# Patient Record
Sex: Male | Born: 1955
Health system: Southern US, Community
[De-identification: ages and names within clinical notes are randomized; demographics above are authoritative.]

## PROBLEM LIST (undated history)

## (undated) DIAGNOSIS — M1990 Unspecified osteoarthritis, unspecified site: Secondary | ICD-10-CM

## (undated) DIAGNOSIS — K219 Gastro-esophageal reflux disease without esophagitis: Secondary | ICD-10-CM

## (undated) DIAGNOSIS — J449 Chronic obstructive pulmonary disease, unspecified: Secondary | ICD-10-CM

## (undated) DIAGNOSIS — I10 Essential (primary) hypertension: Secondary | ICD-10-CM

## (undated) HISTORY — PX: KNEE SURGERY: SHX244

---
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
## 2008-10-01 ENCOUNTER — Ambulatory Visit (HOSPITAL_COMMUNITY): Admission: RE | Admit: 2008-10-01 | Discharge: 2008-10-01 | Payer: Self-pay | Admitting: *Deleted

## 2008-10-01 ENCOUNTER — Encounter (INDEPENDENT_AMBULATORY_CARE_PROVIDER_SITE_OTHER): Payer: Self-pay | Admitting: *Deleted

## 2011-04-25 NOTE — Op Note (Signed)
Marc Watson, Marc Watson NO.:  [id number]   MEDICAL RECORD NO.:  [id number]          PATIENT TYPE:  AMB   LOCATION:  ENDO                         FACILITY:  [facility]   PHYSICIAN:  Georgiana Spinner, M.D.    DATE OF BIRTH:  [birth year]   DATE OF PROCEDURE:  10/01/2008  DATE OF DISCHARGE:                               OPERATIVE REPORT   PROCEDURE:  Colonoscopy.   INDICATIONS:  Colon cancer screening.   ANESTHESIA:  Demerol 20, Versed 2.5 mg.   PROCEDURE:  With the patient mildly sedated in the left lateral  decubitus position, a rectal examination was attempted.  Subsequently,  the Pentax videoscopic colonoscope was inserted in the rectum and passed  under direct vision.  With pressure applied, the patient turned to his  back, to his right, subsequently back to his back, and then again to his  left side, we finally were able to reach the cecum as identified by  ileocecal valve and appendiceal orifice, both of which were  photographed.  From this point, the colonoscope was slowly withdrawn,  taking circumferential views of colonic mucosa, stopping first in the  ascending colon, just a fold removed from the ileocecal valve at which  point a polyp was seen, photographed and removed using snare cautery  technique - setting of 20/150 blended current.  We then withdrew the  colonoscope, taking circumferential views of the remaining colonic  mucosa, stopping next in the transverse colon where another polyp was  seen.  It, too, was removed using hot biopsy forceps technique; however,  we stopped then again at 30 cm from anal verge at which point another  polyp was seen.  It, too, was removed using snare cautery technique, and  there was 1 adjacent to which was also removed in the same manner.  The  tissue was retrieved by grasping it with a biopsy forceps and pulling it  into the scope for retrieval.  The endoscope was removed to the rectum  which appeared normal on direct and  showed a polyp on retroflexed view  that was removed using hot biopsy forceps technique.  All at a setting  of 20/150 blended current.  The patient's vital signs and pulse oximeter  remained stable.  The patient tolerated the procedure well without  apparent complication.   FINDINGS:  Polyps as described above in the ascending colon, transverse  colon, at 30 cm from anal verge and in the rectum.  Await biopsy report.  The patient will call me for results and follow-up with me as an  outpatient.           ______________________________  Georgiana Spinner, M.D.     GMO/MEDQ  D:  10/01/2008  T:  10/01/2008  Job:  [id number]

## 2011-04-25 NOTE — Op Note (Signed)
NAMECARLEN, FILS NO.:  [id number]   MEDICAL RECORD NO.:  [id number]          PATIENT TYPE:  AMB   LOCATION:  ENDO                         FACILITY:  [facility]   PHYSICIAN:  Georgiana Spinner, M.D.    DATE OF BIRTH:  1956/08/23   DATE OF PROCEDURE:  DATE OF DISCHARGE:                               OPERATIVE REPORT   PROCEDURE:  Upper endoscopy.   INDICATIONS FOR PROCEDURE:  Gastroesophageal reflux disease.   ANESTHESIA:  Demerol 100 mg, Versed 10 mg, Phenergan 25 mg.   PROCEDURE:  With the patient mildly sedated in the left lateral  decubitus position, the Pentax videoscopic endoscope was inserted in the  mouth and passed under direct vision through the esophagus which  appeared normal, until we reached distal esophagus and there was a one  small area of probable esophagitis photographed and subsequently  biopsied.  We entered into the stomach; fundus, body, antrum, duodenal  bulb, second portion duodenum all appeared normal.  From this point the  endoscope was slowly withdrawn taking circumferential views of duodenal  mucosa until the endoscope was pulled back into stomach, placed in  retroflexion to view the stomach from below.  The endoscope was  straightened and withdrawn taking circumferential views remaining  gastric and esophageal mucosa.  The patient's vital signs, pulse  oximeter remained stable.  The patient tolerated procedure well without  apparent complications.   FINDINGS:  Change of esophagitis biopsied.  Await biopsy report.  The  patient will call me for results and follow up with me as needed as an  outpatient.  Proceed to colonoscopy as planned.           ______________________________  Georgiana Spinner, M.D.     GMO/MEDQ  D:  10/01/2008  T:  10/01/2008  Job:  [id number]

## 2016-05-22 DIAGNOSIS — Z0000 Encounter for general adult medical examination without abnormal findings: Secondary | ICD-10-CM | POA: Diagnosis not present

## 2016-05-22 DIAGNOSIS — R7309 Other abnormal glucose: Secondary | ICD-10-CM | POA: Diagnosis not present

## 2016-05-22 DIAGNOSIS — E7800 Pure hypercholesterolemia, unspecified: Secondary | ICD-10-CM | POA: Diagnosis not present

## 2016-05-26 DIAGNOSIS — E785 Hyperlipidemia, unspecified: Secondary | ICD-10-CM | POA: Diagnosis not present

## 2016-05-26 DIAGNOSIS — Z0001 Encounter for general adult medical examination with abnormal findings: Secondary | ICD-10-CM | POA: Diagnosis not present

## 2016-05-26 DIAGNOSIS — M25552 Pain in left hip: Secondary | ICD-10-CM | POA: Diagnosis not present

## 2016-05-26 DIAGNOSIS — R739 Hyperglycemia, unspecified: Secondary | ICD-10-CM | POA: Diagnosis not present

## 2016-09-22 DIAGNOSIS — R739 Hyperglycemia, unspecified: Secondary | ICD-10-CM | POA: Diagnosis not present

## 2016-09-22 DIAGNOSIS — E785 Hyperlipidemia, unspecified: Secondary | ICD-10-CM | POA: Diagnosis not present

## 2016-09-22 DIAGNOSIS — Z125 Encounter for screening for malignant neoplasm of prostate: Secondary | ICD-10-CM | POA: Diagnosis not present

## 2016-09-25 DIAGNOSIS — R739 Hyperglycemia, unspecified: Secondary | ICD-10-CM | POA: Diagnosis not present

## 2016-09-25 DIAGNOSIS — E7800 Pure hypercholesterolemia, unspecified: Secondary | ICD-10-CM | POA: Diagnosis not present

## 2016-09-25 DIAGNOSIS — Z0000 Encounter for general adult medical examination without abnormal findings: Secondary | ICD-10-CM | POA: Diagnosis not present

## 2016-10-09 DIAGNOSIS — Z1211 Encounter for screening for malignant neoplasm of colon: Secondary | ICD-10-CM | POA: Diagnosis not present

## 2016-11-07 DIAGNOSIS — K635 Polyp of colon: Secondary | ICD-10-CM | POA: Diagnosis not present

## 2016-11-07 DIAGNOSIS — D120 Benign neoplasm of cecum: Secondary | ICD-10-CM | POA: Diagnosis not present

## 2016-11-07 DIAGNOSIS — D123 Benign neoplasm of transverse colon: Secondary | ICD-10-CM | POA: Diagnosis not present

## 2016-11-07 DIAGNOSIS — K6389 Other specified diseases of intestine: Secondary | ICD-10-CM | POA: Diagnosis not present

## 2016-11-07 DIAGNOSIS — D122 Benign neoplasm of ascending colon: Secondary | ICD-10-CM | POA: Diagnosis not present

## 2016-11-07 DIAGNOSIS — Z1211 Encounter for screening for malignant neoplasm of colon: Secondary | ICD-10-CM | POA: Diagnosis not present

## 2016-12-11 HISTORY — PX: JOINT REPLACEMENT: SHX530

## 2016-12-31 DIAGNOSIS — J019 Acute sinusitis, unspecified: Secondary | ICD-10-CM | POA: Diagnosis not present

## 2016-12-31 DIAGNOSIS — J209 Acute bronchitis, unspecified: Secondary | ICD-10-CM | POA: Diagnosis not present

## 2016-12-31 DIAGNOSIS — J111 Influenza due to unidentified influenza virus with other respiratory manifestations: Secondary | ICD-10-CM | POA: Diagnosis not present

## 2017-01-18 DIAGNOSIS — M9901 Segmental and somatic dysfunction of cervical region: Secondary | ICD-10-CM | POA: Diagnosis not present

## 2017-01-19 DIAGNOSIS — M50122 Cervical disc disorder at C5-C6 level with radiculopathy: Secondary | ICD-10-CM | POA: Diagnosis not present

## 2017-01-19 DIAGNOSIS — M9901 Segmental and somatic dysfunction of cervical region: Secondary | ICD-10-CM | POA: Diagnosis not present

## 2017-01-19 DIAGNOSIS — M9902 Segmental and somatic dysfunction of thoracic region: Secondary | ICD-10-CM | POA: Diagnosis not present

## 2017-01-19 DIAGNOSIS — M5384 Other specified dorsopathies, thoracic region: Secondary | ICD-10-CM | POA: Diagnosis not present

## 2017-01-22 DIAGNOSIS — M9901 Segmental and somatic dysfunction of cervical region: Secondary | ICD-10-CM | POA: Diagnosis not present

## 2017-01-24 DIAGNOSIS — M9902 Segmental and somatic dysfunction of thoracic region: Secondary | ICD-10-CM | POA: Diagnosis not present

## 2017-01-24 DIAGNOSIS — M50323 Other cervical disc degeneration at C6-C7 level: Secondary | ICD-10-CM | POA: Diagnosis not present

## 2017-01-24 DIAGNOSIS — M9901 Segmental and somatic dysfunction of cervical region: Secondary | ICD-10-CM | POA: Diagnosis not present

## 2017-01-24 DIAGNOSIS — M5383 Other specified dorsopathies, cervicothoracic region: Secondary | ICD-10-CM | POA: Diagnosis not present

## 2017-01-25 DIAGNOSIS — M5032 Other cervical disc degeneration, mid-cervical region, unspecified level: Secondary | ICD-10-CM | POA: Diagnosis not present

## 2017-01-26 DIAGNOSIS — M9901 Segmental and somatic dysfunction of cervical region: Secondary | ICD-10-CM | POA: Diagnosis not present

## 2017-01-26 DIAGNOSIS — M5383 Other specified dorsopathies, cervicothoracic region: Secondary | ICD-10-CM | POA: Diagnosis not present

## 2017-01-26 DIAGNOSIS — M50323 Other cervical disc degeneration at C6-C7 level: Secondary | ICD-10-CM | POA: Diagnosis not present

## 2017-01-26 DIAGNOSIS — M9902 Segmental and somatic dysfunction of thoracic region: Secondary | ICD-10-CM | POA: Diagnosis not present

## 2017-01-29 DIAGNOSIS — M5383 Other specified dorsopathies, cervicothoracic region: Secondary | ICD-10-CM | POA: Diagnosis not present

## 2017-01-29 DIAGNOSIS — M9901 Segmental and somatic dysfunction of cervical region: Secondary | ICD-10-CM | POA: Diagnosis not present

## 2017-01-29 DIAGNOSIS — M9902 Segmental and somatic dysfunction of thoracic region: Secondary | ICD-10-CM | POA: Diagnosis not present

## 2017-01-29 DIAGNOSIS — M50323 Other cervical disc degeneration at C6-C7 level: Secondary | ICD-10-CM | POA: Diagnosis not present

## 2017-01-31 DIAGNOSIS — M5383 Other specified dorsopathies, cervicothoracic region: Secondary | ICD-10-CM | POA: Diagnosis not present

## 2017-01-31 DIAGNOSIS — M50323 Other cervical disc degeneration at C6-C7 level: Secondary | ICD-10-CM | POA: Diagnosis not present

## 2017-01-31 DIAGNOSIS — M9901 Segmental and somatic dysfunction of cervical region: Secondary | ICD-10-CM | POA: Diagnosis not present

## 2017-01-31 DIAGNOSIS — M9902 Segmental and somatic dysfunction of thoracic region: Secondary | ICD-10-CM | POA: Diagnosis not present

## 2017-02-02 DIAGNOSIS — M9902 Segmental and somatic dysfunction of thoracic region: Secondary | ICD-10-CM | POA: Diagnosis not present

## 2017-02-02 DIAGNOSIS — M5383 Other specified dorsopathies, cervicothoracic region: Secondary | ICD-10-CM | POA: Diagnosis not present

## 2017-02-02 DIAGNOSIS — M50323 Other cervical disc degeneration at C6-C7 level: Secondary | ICD-10-CM | POA: Diagnosis not present

## 2017-02-02 DIAGNOSIS — M9901 Segmental and somatic dysfunction of cervical region: Secondary | ICD-10-CM | POA: Diagnosis not present

## 2017-02-05 DIAGNOSIS — M50323 Other cervical disc degeneration at C6-C7 level: Secondary | ICD-10-CM | POA: Diagnosis not present

## 2017-02-05 DIAGNOSIS — M9901 Segmental and somatic dysfunction of cervical region: Secondary | ICD-10-CM | POA: Diagnosis not present

## 2017-02-05 DIAGNOSIS — M9905 Segmental and somatic dysfunction of pelvic region: Secondary | ICD-10-CM | POA: Diagnosis not present

## 2017-02-05 DIAGNOSIS — M1612 Unilateral primary osteoarthritis, left hip: Secondary | ICD-10-CM | POA: Diagnosis not present

## 2017-02-07 DIAGNOSIS — M9901 Segmental and somatic dysfunction of cervical region: Secondary | ICD-10-CM | POA: Diagnosis not present

## 2017-02-07 DIAGNOSIS — M9905 Segmental and somatic dysfunction of pelvic region: Secondary | ICD-10-CM | POA: Diagnosis not present

## 2017-02-07 DIAGNOSIS — M50323 Other cervical disc degeneration at C6-C7 level: Secondary | ICD-10-CM | POA: Diagnosis not present

## 2017-02-07 DIAGNOSIS — M1612 Unilateral primary osteoarthritis, left hip: Secondary | ICD-10-CM | POA: Diagnosis not present

## 2017-02-09 DIAGNOSIS — M9905 Segmental and somatic dysfunction of pelvic region: Secondary | ICD-10-CM | POA: Diagnosis not present

## 2017-02-09 DIAGNOSIS — M9901 Segmental and somatic dysfunction of cervical region: Secondary | ICD-10-CM | POA: Diagnosis not present

## 2017-02-09 DIAGNOSIS — M1612 Unilateral primary osteoarthritis, left hip: Secondary | ICD-10-CM | POA: Diagnosis not present

## 2017-02-09 DIAGNOSIS — M50323 Other cervical disc degeneration at C6-C7 level: Secondary | ICD-10-CM | POA: Diagnosis not present

## 2017-02-13 DIAGNOSIS — M9901 Segmental and somatic dysfunction of cervical region: Secondary | ICD-10-CM | POA: Diagnosis not present

## 2017-02-13 DIAGNOSIS — M50323 Other cervical disc degeneration at C6-C7 level: Secondary | ICD-10-CM | POA: Diagnosis not present

## 2017-02-13 DIAGNOSIS — M1612 Unilateral primary osteoarthritis, left hip: Secondary | ICD-10-CM | POA: Diagnosis not present

## 2017-02-13 DIAGNOSIS — M9905 Segmental and somatic dysfunction of pelvic region: Secondary | ICD-10-CM | POA: Diagnosis not present

## 2017-02-16 DIAGNOSIS — M50323 Other cervical disc degeneration at C6-C7 level: Secondary | ICD-10-CM | POA: Diagnosis not present

## 2017-02-16 DIAGNOSIS — M1612 Unilateral primary osteoarthritis, left hip: Secondary | ICD-10-CM | POA: Diagnosis not present

## 2017-02-16 DIAGNOSIS — M9905 Segmental and somatic dysfunction of pelvic region: Secondary | ICD-10-CM | POA: Diagnosis not present

## 2017-02-16 DIAGNOSIS — M9901 Segmental and somatic dysfunction of cervical region: Secondary | ICD-10-CM | POA: Diagnosis not present

## 2017-02-20 DIAGNOSIS — M9905 Segmental and somatic dysfunction of pelvic region: Secondary | ICD-10-CM | POA: Diagnosis not present

## 2017-02-20 DIAGNOSIS — M50323 Other cervical disc degeneration at C6-C7 level: Secondary | ICD-10-CM | POA: Diagnosis not present

## 2017-02-20 DIAGNOSIS — M9901 Segmental and somatic dysfunction of cervical region: Secondary | ICD-10-CM | POA: Diagnosis not present

## 2017-02-20 DIAGNOSIS — M1612 Unilateral primary osteoarthritis, left hip: Secondary | ICD-10-CM | POA: Diagnosis not present

## 2017-02-26 DIAGNOSIS — M50323 Other cervical disc degeneration at C6-C7 level: Secondary | ICD-10-CM | POA: Diagnosis not present

## 2017-02-26 DIAGNOSIS — M9901 Segmental and somatic dysfunction of cervical region: Secondary | ICD-10-CM | POA: Diagnosis not present

## 2017-02-26 DIAGNOSIS — M9905 Segmental and somatic dysfunction of pelvic region: Secondary | ICD-10-CM | POA: Diagnosis not present

## 2017-02-26 DIAGNOSIS — M1612 Unilateral primary osteoarthritis, left hip: Secondary | ICD-10-CM | POA: Diagnosis not present

## 2017-02-28 DIAGNOSIS — M25552 Pain in left hip: Secondary | ICD-10-CM | POA: Diagnosis not present

## 2017-03-05 ENCOUNTER — Other Ambulatory Visit: Payer: Self-pay | Admitting: Orthopaedic Surgery

## 2017-03-16 NOTE — Pre-Procedure Instructions (Addendum)
Marc Watson  03/16/2017      [place], [address] [place] [address] Phone: [phone] Fax: [phone]    Your procedure is scheduled on April 17  Report to [place] at 530 A.M.  Call this number if you have problems the morning of surgery:  [phone]   Remember:  Do not eat food or drink liquids after midnight.  Take these medicines the morning of surgery with A SIP OF WATER  Tylenol if needed  Stop taking aspirin, BC's, Goody's, Herbal medications, Fish Oil, Ibuprofen, Advil, Motrin, Aleve, Naproxen, Vitamins    Do not wear jewelry, make-up or nail polish.  Do not wear lotions, powders, or perfumes, or deoderant.  Do not shave 48 hours prior to surgery.  Men may shave face and neck.  Do not bring valuables to the hospital.  [facility] is not responsible for any belongings or valuables.  Contacts, dentures or bridgework may not be worn into surgery.  Leave your suitcase in the car.  After surgery it may be brought to your room.  For patients admitted to the hospital, discharge time will be determined by your treatment team.  Patients discharged the day of surgery will not be allowed to drive home.   Special instructions:  [place] - Preparing for Surgery  Before surgery, you can play an important role.  Because skin is not sterile, your skin needs to be as free of germs as possible.  You can reduce the number of germs on you skin by washing with CHG (chlorahexidine gluconate) soap before surgery.  CHG is an antiseptic cleaner which kills germs and bonds with the skin to continue killing germs even after washing.  Please DO NOT use if you have an allergy to CHG or antibacterial soaps.  If your skin becomes reddened/irritated stop using the CHG and inform your nurse when you arrive at Short Stay.  Do not shave (including legs and underarms) for at least 48 hours prior to the first CHG  shower.  You may shave your face.  Please follow these instructions carefully:   1.  Shower with CHG Soap the night before surgery and the                                morning of Surgery.  2.  If you choose to wash your hair, wash your hair first as usual with your       normal shampoo.  3.  After you shampoo, rinse your hair and body thoroughly to remove the                      Shampoo.  4.  Use CHG as you would any other liquid soap.  You can apply chg directly       to the skin and wash gently with scrungie or a clean washcloth.  5.  Apply the CHG Soap to your body ONLY FROM THE NECK DOWN.        Do not use on open wounds or open sores.  Avoid contact with your eyes,       ears, mouth and genitals (private parts).  Wash genitals (private parts)       with your normal soap.  6.  Wash thoroughly, paying special attention to the area where your surgery  will be performed.  7.  Thoroughly rinse your body with warm water from the neck down.  8.  DO NOT shower/wash with your normal soap after using and rinsing off       the CHG Soap.  9.  Pat yourself dry with a clean towel.            10.  Wear clean pajamas.            11.  Place clean sheets on your bed the night of your first shower and do not        sleep with pets.  Day of Surgery  Do not apply any lotions/deoderants the morning of surgery.  Please wear clean clothes to the hospital/surgery center.     Please read over the  fact sheets that you were given.

## 2017-03-19 ENCOUNTER — Encounter (HOSPITAL_COMMUNITY): Payer: Self-pay

## 2017-03-19 ENCOUNTER — Encounter (HOSPITAL_COMMUNITY)
Admission: RE | Admit: 2017-03-19 | Discharge: 2017-03-19 | Disposition: A | Discharge status: Home / Self Care | Payer: BLUE CROSS/BLUE SHIELD | Source: Ambulatory Visit | Attending: Orthopaedic Surgery | Admitting: Orthopaedic Surgery

## 2017-03-19 ENCOUNTER — Ambulatory Visit (HOSPITAL_COMMUNITY)
Admission: RE | Admit: 2017-03-19 | Discharge: 2017-03-19 | Disposition: A | Discharge status: Home / Self Care | Payer: BLUE CROSS/BLUE SHIELD | Source: Ambulatory Visit | Attending: Orthopaedic Surgery | Admitting: Orthopaedic Surgery

## 2017-03-19 DIAGNOSIS — Z01812 Encounter for preprocedural laboratory examination: Secondary | ICD-10-CM | POA: Diagnosis not present

## 2017-03-19 DIAGNOSIS — Z01818 Encounter for other preprocedural examination: Secondary | ICD-10-CM

## 2017-03-19 DIAGNOSIS — R001 Bradycardia, unspecified: Secondary | ICD-10-CM | POA: Diagnosis not present

## 2017-03-19 DIAGNOSIS — Z01810 Encounter for preprocedural cardiovascular examination: Secondary | ICD-10-CM | POA: Insufficient documentation

## 2017-03-19 DIAGNOSIS — M1612 Unilateral primary osteoarthritis, left hip: Secondary | ICD-10-CM | POA: Insufficient documentation

## 2017-03-19 HISTORY — DX: Gastro-esophageal reflux disease without esophagitis: K21.9

## 2017-03-19 HISTORY — DX: Unspecified osteoarthritis, unspecified site: M19.90

## 2017-03-19 LAB — CBC WITH DIFFERENTIAL/PLATELET
Basophils Absolute: 0 10*3/uL (ref 0.0–0.1)
Basophils Relative: 0 %
Eosinophils Absolute: 0.4 10*3/uL (ref 0.0–0.7)
Eosinophils Relative: 4 %
HCT: 45.8 % (ref 39.0–52.0)
Hemoglobin: 15.2 g/dL (ref 13.0–17.0)
Lymphocytes Relative: 28 %
Lymphs Abs: 2.6 10*3/uL (ref 0.7–4.0)
MCH: 30.3 pg (ref 26.0–34.0)
MCHC: 33.2 g/dL (ref 30.0–36.0)
MCV: 91.4 fL (ref 78.0–100.0)
Monocytes Absolute: 0.9 10*3/uL (ref 0.1–1.0)
Monocytes Relative: 10 %
Neutro Abs: 5.2 10*3/uL (ref 1.7–7.7)
Neutrophils Relative %: 58 %
Platelets: 285 10*3/uL (ref 150–400)
RBC: 5.01 MIL/uL (ref 4.22–5.81)
RDW: 14.3 % (ref 11.5–15.5)
WBC: 9.1 10*3/uL (ref 4.0–10.5)

## 2017-03-19 LAB — BASIC METABOLIC PANEL
Anion gap: 7 (ref 5–15)
BUN: 16 mg/dL (ref 6–20)
CO2: 24 mmol/L (ref 22–32)
Calcium: 8.9 mg/dL (ref 8.9–10.3)
Chloride: 109 mmol/L (ref 101–111)
Creatinine, Ser: 0.84 mg/dL (ref 0.61–1.24)
GFR calc Af Amer: >60 mL/min (ref >60)
GFR calc non Af Amer: >60 mL/min (ref >60)
Glucose, Bld: 100 mg/dL — ABNORMAL HIGH (ref 65–99)
Potassium: 3.9 mmol/L (ref 3.5–5.1)
Sodium: 140 mmol/L (ref 135–145)

## 2017-03-19 LAB — URINALYSIS, ROUTINE W REFLEX MICROSCOPIC
Bacteria, UA: NONE SEEN
Bilirubin Urine: NEGATIVE
Glucose, UA: NEGATIVE mg/dL
Ketones, ur: NEGATIVE mg/dL
Leukocytes, UA: NEGATIVE
Nitrite: NEGATIVE
Protein, ur: NEGATIVE mg/dL
Specific Gravity, Urine: 1.026 (ref 1.005–1.030)
pH: 5 (ref 5.0–8.0)

## 2017-03-19 LAB — SURGICAL PCR SCREEN
MRSA, PCR: NEGATIVE
Staphylococcus aureus: POSITIVE — AB

## 2017-03-19 LAB — APTT: aPTT: 31 seconds (ref 24–36)

## 2017-03-19 LAB — ABO/RH: ABO/RH(D): O POS

## 2017-03-19 LAB — PROTIME-INR
INR: 0.92
Prothrombin Time: 12.4 seconds (ref 11.4–15.2)

## 2017-03-19 LAB — TYPE AND SCREEN
ABO/RH(D): O POS
Antibody Screen: NEGATIVE

## 2017-03-19 NOTE — Pre-Procedure Instructions (Signed)
Marc Watson  03/19/2017      [facility] - [place], [place] [address] Phone: [phone] Fax: [phone]    Your procedure is scheduled on April 17  Report to [place] at 530 A.M.  Call this number if you have problems the morning of surgery:  [phone]   Remember:  Do not eat food or drink liquids after midnight.  Take these medicines the morning of surgery with A SIP OF WATER  Tylenol if needed  Stop taking aspirin, BC's, Goody's, Herbal medications, Fish Oil, Ibuprofen, Advil, Motrin, Aleve, Naproxen, Vitamins    Do not wear jewelry, make-up or nail polish.  Do not wear lotions, powders, or perfumes, or deoderant.  Do not shave 48 hours prior to surgery.  Men may shave face and neck.  Do not bring valuables to the hospital.  [facility] is not responsible for any belongings or valuables.  Contacts, dentures or bridgework may not be worn into surgery.  Leave your suitcase in the car.  After surgery it may be brought to your room.  For patients admitted to the hospital, discharge time will be determined by your treatment team.  Patients discharged the day of surgery will not be allowed to drive home.   Special instructions:  [place] - Preparing for Surgery  Before surgery, you can play an important role.  Because skin is not sterile, your skin needs to be as free of germs as possible.  You can reduce the number of germs on you skin by washing with CHG (chlorahexidine gluconate) soap before surgery.  CHG is an antiseptic cleaner which kills germs and bonds with the skin to continue killing germs even after washing.  Please DO NOT use if you have an allergy to CHG or antibacterial soaps.  If your skin becomes reddened/irritated stop using the CHG and inform your nurse when you arrive at Short Stay.  Do not shave (including legs and underarms) for at least 48 hours prior to the first CHG  shower.  You may shave your face.  Please follow these instructions carefully:   1.  Shower with CHG Soap the night before surgery and the                                morning of Surgery.  2.  If you choose to wash your hair, wash your hair first as usual with your       normal shampoo.  3.  After you shampoo, rinse your hair and body thoroughly to remove the                      Shampoo.  4.  Use CHG as you would any other liquid soap.  You can apply chg directly       to the skin and wash gently with scrungie or a clean washcloth.  5.  Apply the CHG Soap to your body ONLY FROM THE NECK DOWN.        Do not use on open wounds or open sores.  Avoid contact with your eyes,       ears, mouth and genitals (private parts).  Wash genitals (private parts)       with your normal soap.  6.  Wash thoroughly, paying special attention to the area where your surgery  will be performed.  7.  Thoroughly rinse your body with warm water from the neck down.  8.  DO NOT shower/wash with your normal soap after using and rinsing off       the CHG Soap.  9.  Pat yourself dry with a clean towel.            10.  Wear clean pajamas.            11.  Place clean sheets on your bed the night of your first shower and do not        sleep with pets.  Day of Surgery  Do not apply any lotions/deoderants the morning of surgery.  Please wear clean clothes to the hospital/surgery center.     Please read over the  fact sheets that you were given.

## 2017-03-21 NOTE — H&P (Signed)
TOTAL HIP ADMISSION H&P  Patient is admitted for left total hip arthroplasty.  Subjective:  Chief Complaint: left hip pain  HPI: Marc Watson, 61 y.o. male, has a history of pain and functional disability in the left hip(s) due to arthritis and patient has failed non-surgical conservative treatments for greater than 12 weeks to include NSAID's and/or analgesics, flexibility and strengthening excercises, use of assistive devices, weight reduction as appropriate and activity modification.  Onset of symptoms was gradual starting 5 years ago with gradually worsening course since that time.The patient noted no past surgery on the left hip(s).  Patient currently rates pain in the left hip at 10 out of 10 with activity. Patient has night pain, worsening of pain with activity and weight bearing, trendelenberg gait, pain that interfers with activities of daily living and crepitus. Patient has evidence of subchondral cysts, subchondral sclerosis, periarticular osteophytes and joint space narrowing by imaging studies. This condition presents safety issues increasing the risk of falls.  There is no current active infection.  There are no active problems to display for this patient.  Past Medical History:  Diagnosis Date  . Arthritis   . GERD (gastroesophageal reflux disease)    occ    Past Surgical History:  Procedure Laterality Date  . KNEE SURGERY Right    cartilage  . LEG SURGERY Left 1969   "stunted growth left leg"    No prescriptions prior to admission.   No Known Allergies  Social History  Substance Use Topics  . Smoking status: Current Every Day Smoker    Packs/day: 1.00    Years: 30.00  . Smokeless tobacco: Never Used  . Alcohol use Yes     Comment: liquor ,margarita few wk    No family history on file.   Review of Systems  Musculoskeletal: Positive for joint pain.       Left  All other systems reviewed and are negative.   Objective:  Physical Exam  Constitutional: He  is oriented to person, place, and time. He appears well-developed and well-nourished.  HENT:  Head: Normocephalic and atraumatic.  Eyes: Pupils are equal, round, and reactive to light.  Neck: Normal range of motion.  Cardiovascular: Normal rate.   Respiratory: Effort normal.  GI: Soft.  Musculoskeletal:  Left hip motion is quite limited and extremely painful.  He walks with a significant limp.  Leg lengths look roughly equal.  Sensation and motor function are intact distally.  He has palpable pulses in his feet.  There is no palpable lymphadenopathy behind his knee.    Neurological: He is alert and oriented to person, place, and time.  Skin: Skin is warm and dry.  Psychiatric: He has a normal mood and affect. His behavior is normal. Judgment and thought content normal.    Vital signs in last 24 hours:    Labs:   Estimated body mass index is 30.93 kg/m as calculated from the following:   Height as of 03/19/17: 6\' 1"  (1.854 m).   Weight as of 03/19/17: 106.3 kg (234 lb 6.4 oz).   Imaging Review Plain radiographs demonstrate severe degenerative joint disease of the left hip(s). The bone quality appears to be good for age and reported activity level.  Assessment/Plan:  End stage primary arthritis, left hip(s)  The patient history, physical examination, clinical judgement of the provider and imaging studies are consistent with end stage degenerative joint disease of the left hip(s) and total hip arthroplasty is deemed medically necessary. The treatment options  including medical management, injection therapy, arthroscopy and arthroplasty were discussed at length. The risks and benefits of total hip arthroplasty were presented and reviewed. The risks due to aseptic loosening, infection, stiffness, dislocation/subluxation,  thromboembolic complications and other imponderables were discussed.  The patient acknowledged the explanation, agreed to proceed with the plan and consent was signed.  Patient is being admitted for inpatient treatment for surgery, pain control, PT, OT, prophylactic antibiotics, VTE prophylaxis, progressive ambulation and ADL's and discharge planning.The patient is planning to be discharged home with home health services

## 2017-03-21 NOTE — Progress Notes (Signed)
Anesthesia Chart Review: Patient is a 61 year old male scheduled for left THA on 03/27/17 by Dr. Rhona Watson. Anesthesia is posted for spinal.  History includes smoking, GERD, arthritis, left leg surgery (for "stunted growth") '69. BMI is consistent with mild obesity. No reported history of HTN, but BP was elevated at 166/97 at PAT, but was not rechecked.  No PCP per patient is listed. (PAT RN requested records from Dr. Jani Watson, but nothing received as of yet.)  Meds include fish oil.  BP (!) 166/97   Pulse 68   Temp 36.9 C   Resp 20   Ht 6\' 1"  (1.854 m)   Wt 234 lb 6.4 oz (106.3 kg)   SpO2 98%   BMI 30.93 kg/m   EKG 03/19/17: SB at 58 bpm.   He reported prior stress test many years ago, but wasn't sure where it was done.   CXR 03/19/17: IMPRESSION: No active cardiopulmonary disease.  Preoperative labs noted. UA showed negative leukocytes, nitrites.  Patient will get vitals on arrival to re-evaluate BP. If result acceptable and otherwise no acute changes then I anticipate that he can proceed as planned.  [facility] Short Stay Center/Anesthesiology Phone [phone] 03/21/2017 1:50 PM

## 2017-03-26 MED ORDER — TRANEXAMIC ACID 1000 MG/10ML IV SOLN
2000.0000 mg | INTRAVENOUS | Status: AC
  Administered 2017-03-27: 2000 mg via TOPICAL
  Filled 2017-03-26: qty 20

## 2017-03-27 ENCOUNTER — Inpatient Hospital Stay (HOSPITAL_COMMUNITY): Payer: BLUE CROSS/BLUE SHIELD

## 2017-03-27 ENCOUNTER — Inpatient Hospital Stay (HOSPITAL_COMMUNITY)
Admission: RE | Admit: 2017-03-27 | Discharge: 2017-03-28 | DRG: 470 | Disposition: A | Discharge status: Home Health | Payer: BLUE CROSS/BLUE SHIELD | Source: Ambulatory Visit | Attending: Orthopaedic Surgery | Admitting: Orthopaedic Surgery

## 2017-03-27 ENCOUNTER — Inpatient Hospital Stay (HOSPITAL_COMMUNITY): Payer: BLUE CROSS/BLUE SHIELD | Admitting: Anesthesiology

## 2017-03-27 ENCOUNTER — Encounter (HOSPITAL_COMMUNITY): Payer: Self-pay | Admitting: *Deleted

## 2017-03-27 ENCOUNTER — Encounter (HOSPITAL_COMMUNITY)
Admission: RE | Disposition: A | Discharge status: Home Health | Payer: Self-pay | Source: Ambulatory Visit | Attending: Orthopaedic Surgery

## 2017-03-27 ENCOUNTER — Inpatient Hospital Stay (HOSPITAL_COMMUNITY): Payer: BLUE CROSS/BLUE SHIELD | Admitting: Vascular Surgery

## 2017-03-27 DIAGNOSIS — M1612 Unilateral primary osteoarthritis, left hip: Principal | ICD-10-CM | POA: Diagnosis present

## 2017-03-27 DIAGNOSIS — Z96642 Presence of left artificial hip joint: Secondary | ICD-10-CM | POA: Diagnosis not present

## 2017-03-27 DIAGNOSIS — Z419 Encounter for procedure for purposes other than remedying health state, unspecified: Secondary | ICD-10-CM

## 2017-03-27 DIAGNOSIS — F17210 Nicotine dependence, cigarettes, uncomplicated: Secondary | ICD-10-CM | POA: Diagnosis not present

## 2017-03-27 DIAGNOSIS — Z471 Aftercare following joint replacement surgery: Secondary | ICD-10-CM | POA: Diagnosis not present

## 2017-03-27 HISTORY — PX: TOTAL HIP ARTHROPLASTY: SHX124

## 2017-03-27 SURGERY — ARTHROPLASTY, HIP, TOTAL, ANTERIOR APPROACH
Anesthesia: Spinal | Site: Hip | Laterality: Left

## 2017-03-27 MED ORDER — METOCLOPRAMIDE HCL 5 MG PO TABS: 5.0000 mg | ORAL_TABLET | Freq: Three times a day (TID) | ORAL | Status: DC | PRN

## 2017-03-27 MED ORDER — BUPIVACAINE HCL (PF) 0.5 % IJ SOLN
INTRAMUSCULAR | Status: AC
  Filled 2017-03-27: qty 30

## 2017-03-27 MED ORDER — SUCCINYLCHOLINE CHLORIDE 200 MG/10ML IV SOSY
PREFILLED_SYRINGE | INTRAVENOUS | Status: AC
  Filled 2017-03-27: qty 10

## 2017-03-27 MED ORDER — CHLORHEXIDINE GLUCONATE 4 % EX LIQD: 60.0000 mL | Freq: Once | CUTANEOUS | Status: DC

## 2017-03-27 MED ORDER — HYDROMORPHONE HCL 1 MG/ML IJ SOLN
0.5000 mg | INTRAMUSCULAR | Status: DC | PRN
  Administered 2017-03-27 – 2017-03-28 (×4): 1 mg via INTRAVENOUS
  Filled 2017-03-27 (×4): qty 1

## 2017-03-27 MED ORDER — METHOCARBAMOL 1000 MG/10ML IJ SOLN
500.0000 mg | Freq: Four times a day (QID) | INTRAVENOUS | Status: DC | PRN
  Filled 2017-03-27: qty 5

## 2017-03-27 MED ORDER — PROPOFOL 10 MG/ML IV BOLUS
INTRAVENOUS | Status: DC | PRN
  Administered 2017-03-27 (×3): 20 mg via INTRAVENOUS

## 2017-03-27 MED ORDER — ASPIRIN EC 325 MG PO TBEC
325.0000 mg | DELAYED_RELEASE_TABLET | Freq: Two times a day (BID) | ORAL | Status: DC
  Administered 2017-03-28: 325 mg via ORAL
  Filled 2017-03-27: qty 1

## 2017-03-27 MED ORDER — CEFAZOLIN SODIUM-DEXTROSE 2-4 GM/100ML-% IV SOLN
2.0000 g | Freq: Four times a day (QID) | INTRAVENOUS | Status: AC
  Administered 2017-03-27 (×2): 2 g via INTRAVENOUS
  Filled 2017-03-27 (×2): qty 100

## 2017-03-27 MED ORDER — BISACODYL 5 MG PO TBEC: 5.0000 mg | DELAYED_RELEASE_TABLET | Freq: Every day | ORAL | Status: DC | PRN

## 2017-03-27 MED ORDER — BUPIVACAINE LIPOSOME 1.3 % IJ SUSP
20.0000 mL | Freq: Once | INTRAMUSCULAR | Status: AC
  Administered 2017-03-27: 20 mL
  Filled 2017-03-27: qty 20

## 2017-03-27 MED ORDER — PHENYLEPHRINE 40 MCG/ML (10ML) SYRINGE FOR IV PUSH (FOR BLOOD PRESSURE SUPPORT)
PREFILLED_SYRINGE | INTRAVENOUS | Status: AC
  Filled 2017-03-27: qty 20

## 2017-03-27 MED ORDER — DOCUSATE SODIUM 100 MG PO CAPS
100.0000 mg | ORAL_CAPSULE | Freq: Two times a day (BID) | ORAL | Status: DC
  Administered 2017-03-27 – 2017-03-28 (×2): 100 mg via ORAL
  Filled 2017-03-27 (×2): qty 1

## 2017-03-27 MED ORDER — ONDANSETRON HCL 4 MG/2ML IJ SOLN
INTRAMUSCULAR | Status: AC
  Filled 2017-03-27: qty 4

## 2017-03-27 MED ORDER — PROPOFOL 500 MG/50ML IV EMUL
INTRAVENOUS | Status: DC | PRN
  Administered 2017-03-27: 75 ug/kg/min via INTRAVENOUS
  Administered 2017-03-27: 09:00:00 via INTRAVENOUS

## 2017-03-27 MED ORDER — PROPOFOL 1000 MG/100ML IV EMUL
INTRAVENOUS | Status: AC
  Filled 2017-03-27: qty 100

## 2017-03-27 MED ORDER — MEPERIDINE HCL 25 MG/ML IJ SOLN
INTRAMUSCULAR | Status: AC
  Filled 2017-03-27: qty 1

## 2017-03-27 MED ORDER — METOCLOPRAMIDE HCL 5 MG/ML IJ SOLN: 10.0000 mg | Freq: Once | INTRAMUSCULAR | Status: DC | PRN

## 2017-03-27 MED ORDER — PHENOL 1.4 % MT LIQD: 1.0000 | OROMUCOSAL | Status: DC | PRN

## 2017-03-27 MED ORDER — FENTANYL CITRATE (PF) 250 MCG/5ML IJ SOLN
INTRAMUSCULAR | Status: AC
  Filled 2017-03-27: qty 5

## 2017-03-27 MED ORDER — LACTATED RINGERS IV SOLN: INTRAVENOUS | Status: DC

## 2017-03-27 MED ORDER — EPHEDRINE 5 MG/ML INJ
INTRAVENOUS | Status: AC
  Filled 2017-03-27: qty 20

## 2017-03-27 MED ORDER — MEPERIDINE HCL 25 MG/ML IJ SOLN: 6.2500 mg | INTRAMUSCULAR | Status: DC | PRN

## 2017-03-27 MED ORDER — EPINEPHRINE PF 1 MG/ML IJ SOLN
INTRAMUSCULAR | Status: AC
  Filled 2017-03-27: qty 1

## 2017-03-27 MED ORDER — TRANEXAMIC ACID 1000 MG/10ML IV SOLN
1000.0000 mg | Freq: Once | INTRAVENOUS | Status: AC
  Administered 2017-03-27: 1000 mg via INTRAVENOUS
  Filled 2017-03-27: qty 10

## 2017-03-27 MED ORDER — ACETAMINOPHEN 325 MG PO TABS
650.0000 mg | ORAL_TABLET | Freq: Four times a day (QID) | ORAL | Status: DC | PRN
  Administered 2017-03-28 (×2): 650 mg via ORAL
  Filled 2017-03-27 (×2): qty 2

## 2017-03-27 MED ORDER — EPINEPHRINE PF 1 MG/ML IJ SOLN
INTRAMUSCULAR | Status: DC | PRN
  Administered 2017-03-27: .15 mL

## 2017-03-27 MED ORDER — 0.9 % SODIUM CHLORIDE (POUR BTL) OPTIME
TOPICAL | Status: DC | PRN
  Administered 2017-03-27: 1000 mL

## 2017-03-27 MED ORDER — MIDAZOLAM HCL 5 MG/5ML IJ SOLN
INTRAMUSCULAR | Status: DC | PRN
  Administered 2017-03-27: 2 mg via INTRAVENOUS

## 2017-03-27 MED ORDER — OXYCODONE HCL 5 MG PO TABS
5.0000 mg | ORAL_TABLET | ORAL | Status: DC | PRN
  Administered 2017-03-27 – 2017-03-28 (×7): 10 mg via ORAL
  Filled 2017-03-27 (×7): qty 2

## 2017-03-27 MED ORDER — BUPIVACAINE HCL (PF) 0.5 % IJ SOLN
INTRAMUSCULAR | Status: DC | PRN
Start: 2017-03-27 — End: 2017-03-27
  Administered 2017-03-27: 3 mL

## 2017-03-27 MED ORDER — MIDAZOLAM HCL 2 MG/2ML IJ SOLN
INTRAMUSCULAR | Status: AC
  Filled 2017-03-27: qty 2

## 2017-03-27 MED ORDER — FENTANYL CITRATE (PF) 100 MCG/2ML IJ SOLN
INTRAMUSCULAR | Status: DC | PRN
  Administered 2017-03-27: 50 ug via INTRAVENOUS

## 2017-03-27 MED ORDER — CEFAZOLIN SODIUM-DEXTROSE 2-4 GM/100ML-% IV SOLN
2.0000 g | INTRAVENOUS | Status: AC
  Administered 2017-03-27: 2 g via INTRAVENOUS

## 2017-03-27 MED ORDER — LIDOCAINE 2% (20 MG/ML) 5 ML SYRINGE
INTRAMUSCULAR | Status: AC
  Filled 2017-03-27: qty 5

## 2017-03-27 MED ORDER — ONDANSETRON HCL 4 MG/2ML IJ SOLN
INTRAMUSCULAR | Status: DC | PRN
  Administered 2017-03-27: 4 mg via INTRAVENOUS

## 2017-03-27 MED ORDER — PHENYLEPHRINE HCL 10 MG/ML IJ SOLN
INTRAVENOUS | Status: DC | PRN
  Administered 2017-03-27: 25 ug/min via INTRAVENOUS

## 2017-03-27 MED ORDER — SUGAMMADEX SODIUM 200 MG/2ML IV SOLN
INTRAVENOUS | Status: AC
  Filled 2017-03-27: qty 4

## 2017-03-27 MED ORDER — METHOCARBAMOL 500 MG PO TABS
500.0000 mg | ORAL_TABLET | Freq: Four times a day (QID) | ORAL | Status: DC | PRN
  Administered 2017-03-27 – 2017-03-28 (×4): 500 mg via ORAL
  Filled 2017-03-27 (×3): qty 1

## 2017-03-27 MED ORDER — LACTATED RINGERS IV SOLN
INTRAVENOUS | Status: DC
  Administered 2017-03-27 (×2): via INTRAVENOUS

## 2017-03-27 MED ORDER — LACTATED RINGERS IV SOLN
INTRAVENOUS | Status: DC
  Administered 2017-03-27 – 2017-03-28 (×2): via INTRAVENOUS

## 2017-03-27 MED ORDER — METOCLOPRAMIDE HCL 5 MG/ML IJ SOLN: 5.0000 mg | Freq: Three times a day (TID) | INTRAMUSCULAR | Status: DC | PRN

## 2017-03-27 MED ORDER — ALBUMIN HUMAN 5 % IV SOLN
INTRAVENOUS | Status: DC | PRN
  Administered 2017-03-27: 09:00:00 via INTRAVENOUS

## 2017-03-27 MED ORDER — METHOCARBAMOL 500 MG PO TABS
ORAL_TABLET | ORAL | Status: AC
  Filled 2017-03-27: qty 1

## 2017-03-27 MED ORDER — MENTHOL 3 MG MT LOZG
1.0000 | LOZENGE | OROMUCOSAL | Status: DC | PRN
Start: 2017-03-27 — End: 2017-03-28

## 2017-03-27 MED ORDER — ONDANSETRON HCL 4 MG/2ML IJ SOLN: 4.0000 mg | Freq: Four times a day (QID) | INTRAMUSCULAR | Status: DC | PRN

## 2017-03-27 MED ORDER — ACETAMINOPHEN 650 MG RE SUPP: 650.0000 mg | Freq: Four times a day (QID) | RECTAL | Status: DC | PRN

## 2017-03-27 MED ORDER — DEXAMETHASONE SODIUM PHOSPHATE 10 MG/ML IJ SOLN
INTRAMUSCULAR | Status: AC
  Filled 2017-03-27: qty 1

## 2017-03-27 MED ORDER — OXYCODONE HCL 5 MG PO TABS
ORAL_TABLET | ORAL | Status: AC
  Filled 2017-03-27: qty 2

## 2017-03-27 MED ORDER — CEFAZOLIN SODIUM-DEXTROSE 2-4 GM/100ML-% IV SOLN
INTRAVENOUS | Status: AC
  Filled 2017-03-27: qty 100

## 2017-03-27 MED ORDER — ALUM & MAG HYDROXIDE-SIMETH 200-200-20 MG/5ML PO SUSP: 30.0000 mL | ORAL | Status: DC | PRN

## 2017-03-27 MED ORDER — FENTANYL CITRATE (PF) 100 MCG/2ML IJ SOLN: 25.0000 ug | INTRAMUSCULAR | Status: DC | PRN

## 2017-03-27 MED ORDER — BUPIVACAINE HCL (PF) 0.5 % IJ SOLN
INTRAMUSCULAR | Status: DC | PRN
  Administered 2017-03-27: 30 mL

## 2017-03-27 MED ORDER — PROPOFOL 10 MG/ML IV BOLUS
INTRAVENOUS | Status: AC
  Filled 2017-03-27: qty 20

## 2017-03-27 MED ORDER — DIPHENHYDRAMINE HCL 12.5 MG/5ML PO ELIX: 12.5000 mg | ORAL_SOLUTION | ORAL | Status: DC | PRN

## 2017-03-27 MED ORDER — ONDANSETRON HCL 4 MG PO TABS: 4.0000 mg | ORAL_TABLET | Freq: Four times a day (QID) | ORAL | Status: DC | PRN

## 2017-03-27 MED ORDER — TRANEXAMIC ACID 1000 MG/10ML IV SOLN
1000.0000 mg | INTRAVENOUS | Status: AC
  Administered 2017-03-27: 1000 mg via INTRAVENOUS
  Filled 2017-03-27: qty 10

## 2017-03-27 SURGICAL SUPPLY — 47 items
BAG DECANTER FOR FLEXI CONT (MISCELLANEOUS) ×1 IMPLANT
BLADE SAW SGTL 18X1.27X75 (BLADE) ×2 IMPLANT
CAPT HIP TOTAL 2 ×1 IMPLANT
CELLS DAT CNTRL 66122 CELL SVR (MISCELLANEOUS) ×1 IMPLANT
CLSR STERI-STRIP ANTIMIC 1/2X4 (GAUZE/BANDAGES/DRESSINGS) ×1 IMPLANT
COVER PERINEAL POST (MISCELLANEOUS) ×2 IMPLANT
COVER SURGICAL LIGHT HANDLE (MISCELLANEOUS) ×2 IMPLANT
DECANTER SPIKE VIAL GLASS SM (MISCELLANEOUS) ×1 IMPLANT
DRAPE C-ARM 42X72 X-RAY (DRAPES) ×2 IMPLANT
DRAPE STERI IOBAN 125X83 (DRAPES) ×2 IMPLANT
DRAPE U-SHAPE 47X51 STRL (DRAPES) ×6 IMPLANT
DRSG AQUACEL AG ADV 3.5X10 (GAUZE/BANDAGES/DRESSINGS) ×2 IMPLANT
DURAPREP 26ML APPLICATOR (WOUND CARE) ×2 IMPLANT
ELECT BLADE 4.0 EZ CLEAN MEGAD (MISCELLANEOUS) ×2
ELECT REM PT RETURN 9FT ADLT (ELECTROSURGICAL) ×2
ELECTRODE BLDE 4.0 EZ CLN MEGD (MISCELLANEOUS) IMPLANT
ELECTRODE REM PT RTRN 9FT ADLT (ELECTROSURGICAL) ×1 IMPLANT
FACESHIELD WRAPAROUND (MASK) ×4 IMPLANT
FACESHIELD WRAPAROUND OR TEAM (MASK) ×2 IMPLANT
FILTER STRAW FLUID ASPIR (MISCELLANEOUS) ×1 IMPLANT
GLOVE BIO SURGEON STRL SZ8 (GLOVE) ×4 IMPLANT
GLOVE BIOGEL PI IND STRL 8 (GLOVE) ×2 IMPLANT
GLOVE BIOGEL PI INDICATOR 8 (GLOVE) ×2
GOWN STRL REUS W/ TWL LRG LVL3 (GOWN DISPOSABLE) ×1 IMPLANT
GOWN STRL REUS W/ TWL XL LVL3 (GOWN DISPOSABLE) ×2 IMPLANT
GOWN STRL REUS W/TWL LRG LVL3 (GOWN DISPOSABLE) ×4
GOWN STRL REUS W/TWL XL LVL3 (GOWN DISPOSABLE) ×6
KIT BASIN OR (CUSTOM PROCEDURE TRAY) ×2 IMPLANT
KIT ROOM TURNOVER OR (KITS) ×2 IMPLANT
MANIFOLD NEPTUNE II (INSTRUMENTS) ×2 IMPLANT
NEEDLE HYPO 22GX1.5 SAFETY (NEEDLE) ×2 IMPLANT
NS IRRIG 1000ML POUR BTL (IV SOLUTION) ×2 IMPLANT
PACK TOTAL JOINT (CUSTOM PROCEDURE TRAY) ×2 IMPLANT
PAD ARMBOARD 7.5X6 YLW CONV (MISCELLANEOUS) ×2 IMPLANT
RETRACTOR WND ALEXIS 18 MED (MISCELLANEOUS) ×1 IMPLANT
RTRCTR WOUND ALEXIS 18CM MED (MISCELLANEOUS) ×2
SUT ETHIBOND NAB CT1 #1 30IN (SUTURE) ×4 IMPLANT
SUT MNCRL AB 3-0 PS2 27 (SUTURE) ×2 IMPLANT
SUT VIC AB 1 CT1 27 (SUTURE) ×2
SUT VIC AB 1 CT1 27XBRD ANBCTR (SUTURE) ×1 IMPLANT
SUT VIC AB 2-0 CT1 27 (SUTURE) ×2
SUT VIC AB 2-0 CT1 TAPERPNT 27 (SUTURE) ×1 IMPLANT
SUT VLOC 180 0 24IN GS25 (SUTURE) ×2 IMPLANT
SYR 50ML LL SCALE MARK (SYRINGE) ×2 IMPLANT
TOWEL OR 17X24 6PK STRL BLUE (TOWEL DISPOSABLE) ×2 IMPLANT
TOWEL OR 17X26 10 PK STRL BLUE (TOWEL DISPOSABLE) ×2 IMPLANT
TRAY CATH 16FR W/PLASTIC CATH (SET/KITS/TRAYS/PACK) ×1 IMPLANT

## 2017-03-27 NOTE — Interval H&P Note (Signed)
History and Physical Interval Note:  03/27/2017 7:16 AM  Marc Watson  has presented today for surgery, with the diagnosis of LEFT HIP DEGENERATIVE JOINT DISEASE  The various methods of treatment have been discussed with the patient and family. After consideration of risks, benefits and other options for treatment, the patient has consented to  Procedure(s): TOTAL HIP ARTHROPLASTY ANTERIOR APPROACH (Left) as a surgical intervention .  The patient's history has been reviewed, patient examined, no change in status, stable for surgery.  I have reviewed the patient's chart and labs.  Questions were answered to the patient's satisfaction.     Rajan Burgard G

## 2017-03-27 NOTE — Op Note (Signed)
PRE-OP DIAGNOSIS:  LEFT HIP DEGENERATIVE JOINT DISEASE POST-OP DIAGNOSIS: same PROCEDURE:  LEFT TOTAL HIP ARTHROPLASTY ANTERIOR APPROACH ANESTHESIA:  Spinal and MAC SURGEON:  Melrose Nakayama MD ASSISTANT:  Loni Dolly PA-C   INDICATIONS FOR PROCEDURE:  The patient is a 62 y.o. male with a long history of a painful hip.  This has persisted despite multiple conservative measures.  The patient has persisted with pain and dysfunction making rest and activity difficult.  A total hip replacement is offered as surgical treatment.  Informed operative consent was obtained after discussion of possible complications including reaction to anesthesia, infection, neurovascular injury, dislocation, DVT, PE, and death.  The importance of the postoperative rehab program to optimize result was stressed with the patient.  SUMMARY OF FINDINGS AND PROCEDURE:  Under general anesthesia through a anterior approach an the Hana table a left THR was performed.  The patient had severe degenerative change and excellent bone quality.  We used DePuy components to replace the hip and these were size KA 18 Corail femur capped with a +8.86m ceramic hip ball.  On the acetabular side we used a size 60 Gription shell with a plus 4 neutral polyethylene liner.  We did use a hole eliminator.  ALoni DollyPA-C assisted throughout and was invaluable to the completion of the case in that he helped position and retract while I performed the procedure.  He also closed simultaneously to help minimize OR time.  I used fluoroscopy throughout the case to check position of components and leg lengths and read all these views myself.  DESCRIPTION OF PROCEDURE:  The patient was taken to the OR suite where general anesthetic was applied.  The patient was then positioned on the Hana table supine.  All bony prominences were appropriately padded.  Prep and drape was then performed in normal sterile fashion.  The patient was given kefzol preoperative antibiotic  and an appropriate time out was performed.  We then took an anterior approach to the left hip.  Dissection was taken through adipose to the tensor fascia lata fascia.  This structure was incised longitudinally and we dissected in the intermuscular interval just medial to this muscle.  Cobra retractors were placed superior and inferior to the femoral neck superficial to the capsule.  A capsular incision was then made and the retractors were placed along the femoral neck.  Xray was brought in to get a good level for the femoral neck cut which was made with an oscillating saw and osteotome.  The femoral head was removed with a corkscrew.  The acetabulum was exposed and some labral tissues were excised. Reaming was taken to the inside wall of the pelvis and sequentially up to 1 mm smaller than the actual component.  A trial of components was done and then the aforementioned acetabular shell was placed in appropriate tilt and anteversion confirmed by fluoroscopy. The liner was placed along with the hole eliminator and attention was turned to the femur.  The leg was brought down and over into adduction and the elevator bar was used to raise the femur up gently in the wound.  The piriformis was released with care taken to preserve the obturator internus attachment and all of the posterior capsule. The femur was reamed and then broached to the appropriate size.  A trial reduction was done and the aforementioned head and neck assembly gave uKoreathe best stability in extension with external rotation.  Leg lengths were felt to be about equal by fluoroscopic exam.  The trial components were removed and the wound irrigated.  We then placed the femoral component in appropriate anteversion.  The head was applied to a dry stem neck and the hip again reduced.  It was again stable in the aforementioned position.  The would was irrigated again followed by re-approximation of anterior capsule with ethibond suture. Tensor fascia was  repaired with V-loc suture  followed by deep closure with #O and #2 undyed vicryl.  Skin was closed with subQ stitch and steristrips followed by a sterile dressing.  EBL and IOF can be obtained from anesthesia records.  DISPOSITION:  The patient was extubated in the OR and taken to PACU in stable condition to be admitted to the Orthopedic Surgery for appropriate post-op care to include perioperative antibiotics and DVT prophylaxis.

## 2017-03-27 NOTE — Anesthesia Postprocedure Evaluation (Signed)
Anesthesia Post Note  Patient: MART COLPITTS  Procedure(s) Performed: Procedure(s) (LRB): TOTAL HIP ARTHROPLASTY ANTERIOR APPROACH (Left)  Patient location during evaluation: PACU Anesthesia Type: Spinal Level of consciousness: awake and alert Pain management: pain level controlled Vital Signs Assessment: post-procedure vital signs reviewed and stable Respiratory status: spontaneous breathing and respiratory function stable Cardiovascular status: blood pressure returned to baseline and stable Postop Assessment: no headache, no backache and spinal receding Anesthetic complications: no       Last Vitals:  Vitals:   03/27/17 1135 03/27/17 1225  BP: (!) 126/95 134/82  Pulse: 71 71  Resp: 16 16  Temp:  36.4 C    Last Pain:  Vitals:   03/27/17 1225  TempSrc: Oral                 Montez Hageman

## 2017-03-27 NOTE — Transfer of Care (Signed)
Immediate Anesthesia Transfer of Care Note  Patient: Marc Watson  Procedure(s) Performed: Procedure(s): TOTAL HIP ARTHROPLASTY ANTERIOR APPROACH (Left)  Patient Location: PACU  Anesthesia Type:MAC and Spinal  Level of Consciousness: awake, alert  and oriented  Airway & Oxygen Therapy: Patient Spontanous Breathing  Post-op Assessment: Report given to RN and Post -op Vital signs reviewed and stable  Post vital signs: Reviewed and stable  Last Vitals:  Vitals:   03/27/17 0604  BP: (!) 156/90  Pulse: 81  Resp: 20  Temp: 36.6 C    Last Pain:  Vitals:   03/27/17 0604  TempSrc: Oral         Complications: No apparent anesthesia complications

## 2017-03-27 NOTE — Anesthesia Procedure Notes (Signed)
Spinal  Patient location during procedure: OR Staffing Anesthesiologist: Echo Propp Performed: anesthesiologist  Preanesthetic Checklist Completed: patient identified, site marked, surgical consent, pre-op evaluation, timeout performed, IV checked, risks and benefits discussed and monitors and equipment checked Spinal Block Patient position: sitting Prep: Betadine Patient monitoring: heart rate, continuous pulse ox and blood pressure Approach: right paramedian Location: L3-4 Injection technique: single-shot Needle Needle type: Sprotte  Needle gauge: 24 G Needle length: 9 cm Additional Notes Expiration date of kit checked and confirmed. Patient tolerated procedure well, without complications.       

## 2017-03-27 NOTE — Anesthesia Preprocedure Evaluation (Signed)
Anesthesia Evaluation  Patient identified by MRN, date of birth, ID band Patient awake    Reviewed: Allergy & Precautions, NPO status , Patient's Chart, lab work & pertinent test results  Airway Mallampati: II  TM Distance: >3 FB Neck ROM: Full    Dental no notable dental hx.    Pulmonary Current Smoker,    Pulmonary exam normal breath sounds clear to auscultation       Cardiovascular negative cardio ROS Normal cardiovascular exam Rhythm:Regular Rate:Normal     Neuro/Psych negative neurological ROS  negative psych ROS   GI/Hepatic negative GI ROS, Neg liver ROS,   Endo/Other  negative endocrine ROS  Renal/GU negative Renal ROS  negative genitourinary   Musculoskeletal  (+) Arthritis ,   Abdominal   Peds negative pediatric ROS (+)  Hematology negative hematology ROS (+)   Anesthesia Other Findings   Reproductive/Obstetrics negative OB ROS                             Anesthesia Physical Anesthesia Plan  ASA: II  Anesthesia Plan: Spinal   Post-op Pain Management:    Induction:   Airway Management Planned: Simple Face Mask  Additional Equipment:   Intra-op Plan:   Post-operative Plan:   Informed Consent: I have reviewed the patients History and Physical, chart, labs and discussed the procedure including the risks, benefits and alternatives for the proposed anesthesia with the patient or authorized representative who has indicated his/her understanding and acceptance.   Dental advisory given  Plan Discussed with: CRNA  Anesthesia Plan Comments:         Anesthesia Quick Evaluation

## 2017-03-27 NOTE — Evaluation (Signed)
Physical Therapy Evaluation Patient Details Name: Marc Watson MRN: [id number] DOB: June 08, 1956 Today's Date: 03/27/2017   History of Present Illness  Pt is 61 y/o male s/p elective L THA secondary to L hip OA. PMH includes arthritis, R knee cartilage surgery, and LLE surgery for stunted leg growth.   Clinical Impression  Pt is s/p elective surgery above with deficits below. PTA, pt was independent with all functional mobility. Upon evaluation, pt presenting with post op pain and weakness which limited participation. Pt reporting pain initially in bed, however, reports it got better when up and moving. Anticipate Pt will progress well, and will need follow up recommendations below. Will continue to follow to maximize functional mobility independence and safety.     Follow Up Recommendations Home health PT;Supervision/Assistance - 24 hour    Equipment Recommendations  Rolling walker with 5" wheels    Recommendations for Other Services       Precautions / Restrictions Precautions Precautions: None Precaution Comments: Administered Total hip ther ex handout and reviewed supine ther ex with pt.  Restrictions Weight Bearing Restrictions: Yes LLE Weight Bearing: Weight bearing as tolerated      Mobility  Bed Mobility Overal bed mobility: Needs Assistance Bed Mobility: Supine to Sit     Supine to sit: Min guard;HOB elevated     General bed mobility comments: Min guard for steadying during trunk elevation. Verbal cues for sequencing during bed mobility.   Transfers Overall transfer level: Needs assistance Equipment used: Rolling walker (2 wheeled) Transfers: Sit to/from Stand Sit to Stand: Min guard;From elevated surface         General transfer comment: Min guard for steadying. Verbal cues for appropriate hand and feet placement during transfer.   Ambulation/Gait Ambulation/Gait assistance: Min guard Ambulation Distance (Feet): 25 Feet Assistive device: Rolling walker  (2 wheeled) Gait Pattern/deviations: Step-to pattern;Decreased step length - left;Decreased weight shift to left;Antalgic Gait velocity: Decreased Gait velocity interpretation: Below normal speed for age/gender General Gait Details: Slow, antalgic gait secondary to post op pain and weakness. Verbal cues for sequencing using RW. Min guard for steadying.   Stairs            Wheelchair Mobility    Modified Rankin (Stroke Patients Only)       Balance Overall balance assessment: Needs assistance Sitting-balance support: No upper extremity supported;Feet supported Sitting balance-Leahy Scale: Good     Standing balance support: Bilateral upper extremity supported;During functional activity;No upper extremity supported Standing balance-Leahy Scale: Fair Standing balance comment: Able to maintain static standing for toileting task with no UE support.                              Pertinent Vitals/Pain Pain Assessment: 0-10 Pain Score: 7  Pain Location: L hip  Pain Descriptors / Indicators: Operative site guarding;Sore;Aching Pain Intervention(s): Limited activity within patient's tolerance;Monitored during session;Repositioned    Home Living Family/patient expects to be discharged to:: Private residence Living Arrangements: Spouse/significant other Available Help at Discharge: Family;Available 24 hours/day Type of Home: House Home Access: Stairs to enter Entrance Stairs-Rails: None Entrance Stairs-Number of Steps: 2 Home Layout: Two level;Able to live on main level with bedroom/bathroom Home Equipment: Cane - single point;Crutches;Bedside commode      Prior Function Level of Independence: Independent               Hand Dominance   Dominant Hand: Right    Extremity/Trunk Assessment  Upper Extremity Assessment Upper Extremity Assessment: Overall WFL for tasks assessed    Lower Extremity Assessment Lower Extremity Assessment: LLE  deficits/detail LLE Deficits / Details: Sensory in tact. Deficits consistent with post op pain and weakness. Pt able to perform ankle pumps, heel slides, and quad sets.     Cervical / Trunk Assessment Cervical / Trunk Assessment: Kyphotic  Communication   Communication: No difficulties  Cognition Arousal/Alertness: Awake/alert Behavior During Therapy: WFL for tasks assessed/performed Overall Cognitive Status: Within Functional Limits for tasks assessed                                        General Comments General comments (skin integrity, edema, etc.): Pt's wife present throughout session.     Exercises Total Joint Exercises Ankle Circles/Pumps: AROM;Both;10 reps;Supine Quad Sets: AROM;Left;10 reps;Supine Short Arc Quad: AROM;Left;10 reps;Supine Heel Slides: AROM;Left;5 reps;Supine   Assessment/Plan    PT Assessment Patient needs continued PT services  PT Problem List Decreased strength;Decreased range of motion;Decreased activity tolerance;Decreased balance;Decreased mobility;Decreased knowledge of use of DME;Decreased knowledge of precautions;Pain       PT Treatment Interventions DME instruction;Gait training;Stair training;Functional mobility training;Therapeutic activities;Therapeutic exercise;Balance training;Neuromuscular re-education;Patient/family education    PT Goals (Current goals can be found in the Care Plan section)  Acute Rehab PT Goals Patient Stated Goal: to go home  PT Goal Formulation: With patient Time For Goal Achievement: 04/03/17 Potential to Achieve Goals: Good    Frequency 7X/week   Barriers to discharge        Co-evaluation               End of Session Equipment Utilized During Treatment: Gait belt Activity Tolerance: Patient limited by pain Patient left: in chair;with call bell/phone within reach;with family/visitor present;with nursing/sitter in room Nurse Communication: Mobility status;Patient requests pain  meds PT Visit Diagnosis: Other abnormalities of gait and mobility (R26.89);Pain Pain - Right/Left: Left Pain - part of body: Hip    Time: 3762-8315 PT Time Calculation (min) (ACUTE ONLY): 31 min   Charges:   PT Evaluation $PT Eval Low Complexity: 1 Procedure PT Treatments $Gait Training: 8-22 mins   PT G Codes:        Marc Watson, PT, DPT  Acute Rehabilitation Services  Pager: [phone]   Army Melia 03/27/2017, 2:33 PM

## 2017-03-28 ENCOUNTER — Encounter (HOSPITAL_COMMUNITY): Payer: Self-pay | Admitting: Orthopaedic Surgery

## 2017-03-28 MED ORDER — METHOCARBAMOL 500 MG PO TABS: 500.0000 mg | ORAL_TABLET | Freq: Four times a day (QID) | ORAL | 0 refills | Status: DC | PRN

## 2017-03-28 MED ORDER — BISACODYL 5 MG PO TBEC: 5.0000 mg | DELAYED_RELEASE_TABLET | Freq: Every day | ORAL | 0 refills | Status: DC | PRN

## 2017-03-28 MED ORDER — ASPIRIN 325 MG PO TBEC: 325.0000 mg | DELAYED_RELEASE_TABLET | Freq: Two times a day (BID) | ORAL | 0 refills | Status: DC

## 2017-03-28 MED ORDER — DOCUSATE SODIUM 100 MG PO CAPS: 100.0000 mg | ORAL_CAPSULE | Freq: Two times a day (BID) | ORAL | 0 refills | Status: DC

## 2017-03-28 MED ORDER — OXYCODONE-ACETAMINOPHEN 5-325 MG PO TABS: 1.0000 | ORAL_TABLET | ORAL | 0 refills | Status: DC | PRN

## 2017-03-28 NOTE — Discharge Summary (Signed)
Patient ID: Marc Watson MRN: [id number] DOB/AGE: [birth year] 61 y.o.  Admit date: 03/27/2017 Discharge date: 03/28/2017  Admission Diagnoses:  Principal Problem:   Primary localized osteoarthritis of left hip Active Problems:   Primary osteoarthritis of left hip   Discharge Diagnoses:  Same  Past Medical History:  Diagnosis Date  . Arthritis   . GERD (gastroesophageal reflux disease)    occ    Surgeries: Procedure(s): TOTAL HIP ARTHROPLASTY ANTERIOR APPROACH on 03/27/2017   Consultants:   Discharged Condition: Improved  Hospital Course: Marc Watson is an 62 y.o. male who was admitted 03/27/2017 for operative treatment ofPrimary localized osteoarthritis of left hip. Patient has severe unremitting pain that affects sleep, daily activities, and work/hobbies. After pre-op clearance the patient was taken to the operating room on 03/27/2017 and underwent  Procedure(s): TOTAL HIP ARTHROPLASTY ANTERIOR APPROACH.    Patient was given perioperative antibiotics: Anti-infectives    Start     Dose/Rate Route Frequency Ordered Stop   03/27/17 1400  ceFAZolin (ANCEF) IVPB 2g/100 mL premix     2 g 200 mL/hr over 30 Minutes Intravenous Every 6 hours 03/27/17 1206 03/27/17 2140   03/27/17 0615  ceFAZolin (ANCEF) 2-4 GM/100ML-% IVPB    Comments:  Precious Haws   : cabinet override      03/27/17 0615 03/27/17 0752   03/27/17 0611  ceFAZolin (ANCEF) IVPB 2g/100 mL premix     2 g 200 mL/hr over 30 Minutes Intravenous On call to O.R. 03/27/17 1062 03/27/17 0755       Patient was given sequential compression devices, early ambulation, and chemoprophylaxis to prevent DVT.  Patient benefited maximally from hospital stay and there were no complications.    Recent vital signs: Patient Vitals for the past 24 hrs:  BP Temp Temp src Pulse Resp SpO2  03/28/17 1447 (!) 141/77 99.5 F (37.5 C) Oral 88 18 97 %  03/28/17 1337 139/64 98.9 F (37.2 C) - 89 18 99 %  03/28/17 0529 127/72 98.8 F  (37.1 C) Oral 79 19 96 %  03/27/17 2128 (!) 157/91 98.2 F (36.8 C) Oral 83 18 97 %     Recent laboratory studies: No results for input(s): WBC, HGB, HCT, PLT, NA, K, CL, CO2, BUN, CREATININE, GLUCOSE, INR, CALCIUM in the last 72 hours.  Invalid input(s): PT, 2   Discharge Medications:   Allergies as of 03/28/2017      Reactions   No Known Allergies       Medication List    STOP taking these medications   naproxen sodium 220 MG tablet Commonly known as:  ANAPROX     TAKE these medications   acetaminophen 500 MG tablet Commonly known as:  TYLENOL Take 1,000 mg by mouth every 6 (six) hours as needed (for pain.).   aspirin 325 MG EC tablet Take 1 tablet (325 mg total) by mouth 2 (two) times daily after a meal.   bisacodyl 5 MG EC tablet Commonly known as:  DULCOLAX Take 1 tablet (5 mg total) by mouth daily as needed for moderate constipation.   docusate sodium 100 MG capsule Commonly known as:  COLACE Take 1 capsule (100 mg total) by mouth 2 (two) times daily.   FISH OIL PO Take 2 capsules by mouth every other day. OMEGA XL PROPRIETARY BLEND 300 MG d-ALPHA TOCOPHEROL (VITAMIN E)/EXTRA VIRGIN OLIVE OIL/EXTRACT (pcso-524) CONTAINING OMEGA FATTY ACIDS, GREEN LIPPED MUSSEL (PERNA CANLICULUS) OIL)   methocarbamol 500 MG tablet Commonly known as:  ROBAXIN  Take 1 tablet (500 mg total) by mouth every 6 (six) hours as needed for muscle spasms.   oxyCODONE-acetaminophen 5-325 MG tablet Commonly known as:  ROXICET Take 1-2 tablets by mouth every 4 (four) hours as needed for severe pain.            Durable Medical Equipment        Start     Ordered   03/27/17 1206  DME Walker rolling  Once    Question:  Patient needs a walker to treat with the following condition  Answer:  Primary osteoarthritis of left hip   03/27/17 1206   03/27/17 1206  DME 3 n 1  Once     03/27/17 1206   03/27/17 1206  DME Bedside commode  Once    Question:  Patient needs a bedside commode to  treat with the following condition  Answer:  Primary osteoarthritis of left hip   03/27/17 1206      Diagnostic Studies: Dg Chest 2 View  Result Date: 03/19/2017 CLINICAL DATA:  Preop hip surgery EXAM: CHEST  2 VIEW COMPARISON:  08/11/2008 FINDINGS: The heart size and mediastinal contours are within normal limits. Both lungs are clear. The visualized skeletal structures are unremarkable. IMPRESSION: No active cardiopulmonary disease. Electronically Signed   By: Franchot Gallo M.D.   On: 03/19/2017 09:17   Dg C-arm Gt 120 Min  Result Date: 03/27/2017 CLINICAL DATA:  Post left hip prosthesis EXAM: OPERATIVE left HIP (WITH PELVIS IF PERFORMED) 4 VIEWS TECHNIQUE: Fluoroscopic spot image(s) were submitted for interpretation post-operatively. COMPARISON:  None. FINDINGS: Four views of the left hip submitted. There is left hip prosthesis with anatomic alignment. IMPRESSION: Left hip prosthesis with anatomic alignment. Fluoroscopy time was 42 seconds.  Please see the operative report. Electronically Signed   By: Lahoma Crocker M.D.   On: 03/27/2017 10:11   Dg Hip Operative Unilat With Pelvis Left  Result Date: 03/27/2017 CLINICAL DATA:  Post left hip prosthesis EXAM: OPERATIVE left HIP (WITH PELVIS IF PERFORMED) 4 VIEWS TECHNIQUE: Fluoroscopic spot image(s) were submitted for interpretation post-operatively. COMPARISON:  None. FINDINGS: Four views of the left hip submitted. There is left hip prosthesis with anatomic alignment. IMPRESSION: Left hip prosthesis with anatomic alignment. Fluoroscopy time was 42 seconds.  Please see the operative report. Electronically Signed   By: Lahoma Crocker M.D.   On: 03/27/2017 10:11    Disposition: Final discharge disposition not confirmed  Discharge Instructions    Call MD / Call 911    Complete by:  As directed    If you experience chest pain or shortness of breath, CALL 911 and be transported to the hospital emergency room.  If you develope a fever above 101 F, pus (white  drainage) or increased drainage or redness at the wound, or calf pain, call your surgeon's office.   Constipation Prevention    Complete by:  As directed    Drink plenty of fluids.  Prune juice may be helpful.  You may use a stool softener, such as Colace (over the counter) 100 mg twice a day.  Use MiraLax (over the counter) for constipation as needed.   Diet - low sodium heart healthy    Complete by:  As directed    Discharge instructions    Complete by:  As directed    INSTRUCTIONS AFTER JOINT REPLACEMENT   Remove items at home which could result in a fall. This includes throw rugs or furniture in walking pathways ICE to the  affected joint every three hours while awake for 30 minutes at a time, for at least the first 3-5 days, and then as needed for pain and swelling.  Continue to use ice for pain and swelling. You may notice swelling that will progress down to the foot and ankle.  This is normal after surgery.  Elevate your leg when you are not up walking on it.   Continue to use the breathing machine you got in the hospital (incentive spirometer) which will help keep your temperature down.  It is common for your temperature to cycle up and down following surgery, especially at night when you are not up moving around and exerting yourself.  The breathing machine keeps your lungs expanded and your temperature down.   DIET:  As you were doing prior to hospitalization, we recommend a well-balanced diet.  DRESSING / WOUND CARE / SHOWERING  You may shower 3 days after surgery, but keep the wounds dry during showering.  You may use an occlusive plastic wrap (Press'n Seal for example), NO SOAKING/SUBMERGING IN THE BATHTUB.  If the bandage gets wet, change with a clean dry gauze.  If the incision gets wet, pat the wound dry with a clean towel.  ACTIVITY  Increase activity slowly as tolerated, but follow the weight bearing instructions below.   No driving for 6 weeks or until further direction  given by your physician.  You cannot drive while taking narcotics.  No lifting or carrying greater than 10 lbs. until further directed by your surgeon. Avoid periods of inactivity such as sitting longer than an hour when not asleep. This helps prevent blood clots.  You may return to work once you are authorized by your doctor.     WEIGHT BEARING   Weight bearing as tolerated with assist device (walker, cane, etc) as directed, use it as long as suggested by your surgeon or therapist, typically at least 4-6 weeks.   EXERCISES  Results after joint replacement surgery are often greatly improved when you follow the exercise, range of motion and muscle strengthening exercises prescribed by your doctor. Safety measures are also important to protect the joint from further injury. Any time any of these exercises cause you to have increased pain or swelling, decrease what you are doing until you are comfortable again and then slowly increase them. If you have problems or questions, call your caregiver or physical therapist for advice.   Rehabilitation is important following a joint replacement. After just a few days of immobilization, the muscles of the leg can become weakened and shrink (atrophy).  These exercises are designed to build up the tone and strength of the thigh and leg muscles and to improve motion. Often times heat used for twenty to thirty minutes before working out will loosen up your tissues and help with improving the range of motion but do not use heat for the first two weeks following surgery (sometimes heat can increase post-operative swelling).   These exercises can be done on a training (exercise) mat, on the floor, on a table or on a bed. Use whatever works the best and is most comfortable for you.    Use music or television while you are exercising so that the exercises are a pleasant break in your day. This will make your life better with the exercises acting as a break in your  routine that you can look forward to.   Perform all exercises about fifteen times, three times per day or as directed.  You should exercise both the operative leg and the other leg as well.   Exercises include:   Quad Sets - Tighten up the muscle on the front of the thigh (Quad) and hold for 5-10 seconds.   Straight Leg Raises - With your knee straight (if you were given a brace, keep it on), lift the leg to 60 degrees, hold for 3 seconds, and slowly lower the leg.  Perform this exercise against resistance later as your leg gets stronger.  Leg Slides: Lying on your back, slowly slide your foot toward your buttocks, bending your knee up off the floor (only go as far as is comfortable). Then slowly slide your foot back down until your leg is flat on the floor again.  Angel Wings: Lying on your back spread your legs to the side as far apart as you can without causing discomfort.  Hamstring Strength:  Lying on your back, push your heel against the floor with your leg straight by tightening up the muscles of your buttocks.  Repeat, but this time bend your knee to a comfortable angle, and push your heel against the floor.  You may put a pillow under the heel to make it more comfortable if necessary.   A rehabilitation program following joint replacement surgery can speed recovery and prevent re-injury in the future due to weakened muscles. Contact your doctor or a physical therapist for more information on knee rehabilitation.    CONSTIPATION  Constipation is defined medically as fewer than three stools per week and severe constipation as less than one stool per week.  Even if you have a regular bowel pattern at home, your normal regimen is likely to be disrupted due to multiple reasons following surgery.  Combination of anesthesia, postoperative narcotics, change in appetite and fluid intake all can affect your bowels.   YOU MUST use at least one of the following options; they are listed in order of  increasing strength to get the job done.  They are all available over the counter, and you may need to use some, POSSIBLY even all of these options:    Drink plenty of fluids (prune juice may be helpful) and high fiber foods Colace 100 mg by mouth twice a day  Senokot for constipation as directed and as needed Dulcolax (bisacodyl), take with full glass of water  Miralax (polyethylene glycol) once or twice a day as needed.  If you have tried all these things and are unable to have a bowel movement in the first 3-4 days after surgery call either your surgeon or your primary doctor.    If you experience loose stools or diarrhea, hold the medications until you stool forms back up.  If your symptoms do not get better within 1 week or if they get worse, check with your doctor.  If you experience "the worst abdominal pain ever" or develop nausea or vomiting, please contact the office immediately for further recommendations for treatment.   ITCHING:  If you experience itching with your medications, try taking only a single pain pill, or even half a pain pill at a time.  You can also use Benadryl over the counter for itching or also to help with sleep.   TED HOSE STOCKINGS:  Use stockings on both legs until for at least 2 weeks or as directed by physician office. They may be removed at night for sleeping.  MEDICATIONS:  See your medication summary on the "After Visit Summary" that nursing will review with you.  You may have some home medications which will be placed on hold until you complete the course of blood thinner medication.  It is important for you to complete the blood thinner medication as prescribed.  PRECAUTIONS:  If you experience chest pain or shortness of breath - call 911 immediately for transfer to the hospital emergency department.   If you develop a fever greater that 101 F, purulent drainage from wound, increased redness or drainage from wound, foul odor from the wound/dressing, or  calf pain - CONTACT YOUR SURGEON.                                                   FOLLOW-UP APPOINTMENTS:  If you do not already have a post-op appointment, please call the office for an appointment to be seen by your surgeon.  Guidelines for how soon to be seen are listed in your "After Visit Summary", but are typically between 1-4 weeks after surgery.  OTHER INSTRUCTIONS:   Knee Replacement:  Do not place pillow under knee, focus on keeping the knee straight while resting. CPM instructions: 0-90 degrees, 2 hours in the morning, 2 hours in the afternoon, and 2 hours in the evening. Place foam block, curve side up under heel at all times except when in CPM or when walking.  DO NOT modify, tear, cut, or change the foam block in any way.  MAKE SURE YOU:  Understand these instructions.  Get help right away if you are not doing well or get worse.    Thank you for letting us be a part of your medical care team.  It is a privilege we respect greatly.  We hope these instructions will help you stay on track for a fast and full recovery!   Increase activity slowly as tolerated    Complete by:  As directed       Follow-up Information    DALLDORF,PETER G, MD. Schedule an appointment as soon as possible for a visit in 2 weeks.   Specialty:  Orthopedic Surgery Contact information: [place] [address] [phone]            Signed: Rich Fuchs 03/28/2017, 3:08 PM

## 2017-03-28 NOTE — Plan of Care (Signed)
Problem: Education: Goal: Knowledge of [facility] Education information/materials will improve Outcome: Progressing POC reviewed with pt.   

## 2017-03-28 NOTE — Progress Notes (Signed)
Subjective: 1 Day Post-Op Procedure(s) (LRB): TOTAL HIP ARTHROPLASTY ANTERIOR APPROACH (Left)  Activity level:  wbat Diet tolerance:  ok Voiding:  ok Patient reports pain as mild and moderate.    Objective: Vital signs in last 24 hours: Temp:  [97 F (36.1 C)-98.8 F (37.1 C)] 98.8 F (37.1 C) (04/18 0529) Pulse Rate:  [63-83] 79 (04/18 0529) Resp:  [11-19] 19 (04/18 0529) BP: (108-157)/(67-95) 127/72 (04/18 0529) SpO2:  [96 %-100 %] 96 % (04/18 0529)  Labs: No results for input(s): HGB in the last 72 hours. No results for input(s): WBC, RBC, HCT, PLT in the last 72 hours. No results for input(s): NA, K, CL, CO2, BUN, CREATININE, GLUCOSE, CALCIUM in the last 72 hours. No results for input(s): LABPT, INR in the last 72 hours.  Physical Exam:  Neurologically intact ABD soft Neurovascular intact Sensation intact distally Intact pulses distally Dorsiflexion/Plantar flexion intact Incision: dressing C/D/I and no drainage No cellulitis present Compartment soft  Assessment/Plan:  1 Day Post-Op Procedure(s) (LRB): TOTAL HIP ARTHROPLASTY ANTERIOR APPROACH (Left) Advance diet Up with therapy D/C IV fluids Discharge home with home health potentially today if cleared by PT if not then tomorrow. Follow up in office 2 weeks post op. Continue on ASA 325mg  BID x 2 weeks post op.  Marc Watson, Larwance Sachs 03/28/2017, 7:52 AM

## 2017-03-28 NOTE — Progress Notes (Signed)
Physical Therapy Treatment Patient Details Name: Marc Watson MRN: [id number] DOB: 19-Dec-1955 Today's Date: 03/28/2017    History of Present Illness Pt is 61 y/o male s/p elective L THA secondary to L hip OA. PMH includes arthritis, R knee cartilage surgery, and LLE surgery for stunted leg growth.     PT Comments    Patient is progressing very well toward mobility goals. Pt able to ambulate and negotiate stairs safely with assist from wife to manage stairs. Current plan remains appropriate.    Follow Up Recommendations  Home health PT;Supervision/Assistance - 24 hour     Equipment Recommendations  Rolling walker with 5" wheels    Recommendations for Other Services       Precautions / Restrictions Precautions Precautions: None Restrictions Weight Bearing Restrictions: Yes LLE Weight Bearing: Weight bearing as tolerated    Mobility  Bed Mobility Overal bed mobility: Modified Independent Bed Mobility: Supine to Sit           General bed mobility comments: increased effort  Transfers Overall transfer level: Needs assistance Equipment used: Rolling walker (2 wheeled) Transfers: Sit to/from Stand Sit to Stand: Min guard         General transfer comment: min guard for safety; no physical assist needed; cues for safe hand placement  Ambulation/Gait Ambulation/Gait assistance: Supervision Ambulation Distance (Feet): 165 Feet Assistive device: Rolling walker (2 wheeled) Gait Pattern/deviations: Decreased weight shift to left;Step-through pattern;Decreased stride length Gait velocity: Decreased   General Gait Details: cues for posture and sequencing   Stairs Stairs: Yes   Stair Management: No rails;Backwards;With walker Number of Stairs: 2 General stair comments: cues for sequenicng and technique; wife stabilized RW  Wheelchair Mobility    Modified Rankin (Stroke Patients Only)       Balance Overall balance assessment: Needs  assistance Sitting-balance support: No upper extremity supported;Feet supported Sitting balance-Leahy Scale: Good       Standing balance-Leahy Scale: Fair                              Cognition Arousal/Alertness: Awake/alert Behavior During Therapy: WFL for tasks assessed/performed Overall Cognitive Status: Within Functional Limits for tasks assessed                                        Exercises      General Comments General comments (skin integrity, edema, etc.): wife present during session      Pertinent Vitals/Pain Pain Assessment: Faces Faces Pain Scale: Hurts a little bit Pain Location: L hip  Pain Descriptors / Indicators: Operative site guarding;Sore;Tightness Pain Intervention(s): Limited activity within patient's tolerance;Monitored during session;Premedicated before session;Repositioned    Home Living                      Prior Function            PT Goals (current goals can now be found in the care plan section) Acute Rehab PT Goals Patient Stated Goal: to go home  PT Goal Formulation: With patient Time For Goal Achievement: 04/03/17 Potential to Achieve Goals: Good Progress towards PT goals: Progressing toward goals    Frequency    7X/week      PT Plan Current plan remains appropriate    Co-evaluation             End of  Session Equipment Utilized During Treatment: Gait belt Activity Tolerance: Patient tolerated treatment well Patient left: in chair;with call bell/phone within reach;with family/visitor present Nurse Communication: Mobility status PT Visit Diagnosis: Other abnormalities of gait and mobility (R26.89);Pain Pain - Right/Left: Left Pain - part of body: Hip     Time: 4356-8616 PT Time Calculation (min) (ACUTE ONLY): 36 min  Charges:  $Gait Training: 8-22 mins $Therapeutic Activity: 8-22 mins                    G Codes:       Earney Navy, PTA Pager: [phone]      Darliss Cheney 03/28/2017, 1:37 PM

## 2017-03-29 NOTE — Care Management (Signed)
Patient was discharged prior to CM setting up [place]. CM spoke with Stevie Kern, [place]  for referral, patient has RW and 3in1 and will have family support at discharge. [facility] will contact patient to arrange start date and time for therapy.

## 2017-03-30 DIAGNOSIS — F17210 Nicotine dependence, cigarettes, uncomplicated: Secondary | ICD-10-CM | POA: Diagnosis not present

## 2017-03-30 DIAGNOSIS — Z471 Aftercare following joint replacement surgery: Secondary | ICD-10-CM | POA: Diagnosis not present

## 2017-03-30 DIAGNOSIS — K219 Gastro-esophageal reflux disease without esophagitis: Secondary | ICD-10-CM | POA: Diagnosis not present

## 2017-03-30 DIAGNOSIS — Z96642 Presence of left artificial hip joint: Secondary | ICD-10-CM | POA: Diagnosis not present

## 2017-03-30 DIAGNOSIS — Z79891 Long term (current) use of opiate analgesic: Secondary | ICD-10-CM | POA: Diagnosis not present

## 2017-04-02 DIAGNOSIS — K219 Gastro-esophageal reflux disease without esophagitis: Secondary | ICD-10-CM | POA: Diagnosis not present

## 2017-04-02 DIAGNOSIS — Z79891 Long term (current) use of opiate analgesic: Secondary | ICD-10-CM | POA: Diagnosis not present

## 2017-04-02 DIAGNOSIS — Z471 Aftercare following joint replacement surgery: Secondary | ICD-10-CM | POA: Diagnosis not present

## 2017-04-02 DIAGNOSIS — Z96642 Presence of left artificial hip joint: Secondary | ICD-10-CM | POA: Diagnosis not present

## 2017-04-02 DIAGNOSIS — F17210 Nicotine dependence, cigarettes, uncomplicated: Secondary | ICD-10-CM | POA: Diagnosis not present

## 2017-04-04 DIAGNOSIS — Z79891 Long term (current) use of opiate analgesic: Secondary | ICD-10-CM | POA: Diagnosis not present

## 2017-04-04 DIAGNOSIS — K219 Gastro-esophageal reflux disease without esophagitis: Secondary | ICD-10-CM | POA: Diagnosis not present

## 2017-04-04 DIAGNOSIS — Z471 Aftercare following joint replacement surgery: Secondary | ICD-10-CM | POA: Diagnosis not present

## 2017-04-04 DIAGNOSIS — F17210 Nicotine dependence, cigarettes, uncomplicated: Secondary | ICD-10-CM | POA: Diagnosis not present

## 2017-04-04 DIAGNOSIS — Z96642 Presence of left artificial hip joint: Secondary | ICD-10-CM | POA: Diagnosis not present

## 2017-04-09 DIAGNOSIS — M1612 Unilateral primary osteoarthritis, left hip: Secondary | ICD-10-CM | POA: Diagnosis not present

## 2017-04-11 DIAGNOSIS — F17210 Nicotine dependence, cigarettes, uncomplicated: Secondary | ICD-10-CM | POA: Diagnosis not present

## 2017-04-11 DIAGNOSIS — Z96642 Presence of left artificial hip joint: Secondary | ICD-10-CM | POA: Diagnosis not present

## 2017-04-11 DIAGNOSIS — K219 Gastro-esophageal reflux disease without esophagitis: Secondary | ICD-10-CM | POA: Diagnosis not present

## 2017-04-11 DIAGNOSIS — Z79891 Long term (current) use of opiate analgesic: Secondary | ICD-10-CM | POA: Diagnosis not present

## 2017-04-11 DIAGNOSIS — Z471 Aftercare following joint replacement surgery: Secondary | ICD-10-CM | POA: Diagnosis not present

## 2017-04-13 DIAGNOSIS — Z96642 Presence of left artificial hip joint: Secondary | ICD-10-CM | POA: Diagnosis not present

## 2017-04-13 DIAGNOSIS — Z471 Aftercare following joint replacement surgery: Secondary | ICD-10-CM | POA: Diagnosis not present

## 2017-04-13 DIAGNOSIS — K219 Gastro-esophageal reflux disease without esophagitis: Secondary | ICD-10-CM | POA: Diagnosis not present

## 2017-04-13 DIAGNOSIS — F17210 Nicotine dependence, cigarettes, uncomplicated: Secondary | ICD-10-CM | POA: Diagnosis not present

## 2017-04-13 DIAGNOSIS — Z79891 Long term (current) use of opiate analgesic: Secondary | ICD-10-CM | POA: Diagnosis not present

## 2017-04-23 DIAGNOSIS — M1612 Unilateral primary osteoarthritis, left hip: Secondary | ICD-10-CM | POA: Diagnosis not present

## 2017-05-21 DIAGNOSIS — Z471 Aftercare following joint replacement surgery: Secondary | ICD-10-CM | POA: Diagnosis not present

## 2017-05-21 DIAGNOSIS — Z96642 Presence of left artificial hip joint: Secondary | ICD-10-CM | POA: Diagnosis not present

## 2017-05-21 DIAGNOSIS — M1612 Unilateral primary osteoarthritis, left hip: Secondary | ICD-10-CM | POA: Diagnosis not present

## 2017-05-22 DIAGNOSIS — R739 Hyperglycemia, unspecified: Secondary | ICD-10-CM | POA: Diagnosis not present

## 2017-05-22 DIAGNOSIS — E7800 Pure hypercholesterolemia, unspecified: Secondary | ICD-10-CM | POA: Diagnosis not present

## 2017-05-22 DIAGNOSIS — Z0000 Encounter for general adult medical examination without abnormal findings: Secondary | ICD-10-CM | POA: Diagnosis not present

## 2017-05-22 DIAGNOSIS — N390 Urinary tract infection, site not specified: Secondary | ICD-10-CM | POA: Diagnosis not present

## 2017-05-30 DIAGNOSIS — M25552 Pain in left hip: Secondary | ICD-10-CM | POA: Diagnosis not present

## 2017-05-30 DIAGNOSIS — Z0001 Encounter for general adult medical examination with abnormal findings: Secondary | ICD-10-CM | POA: Diagnosis not present

## 2017-05-30 DIAGNOSIS — R739 Hyperglycemia, unspecified: Secondary | ICD-10-CM | POA: Diagnosis not present

## 2017-05-30 DIAGNOSIS — E785 Hyperlipidemia, unspecified: Secondary | ICD-10-CM | POA: Diagnosis not present

## 2017-06-20 DIAGNOSIS — M1612 Unilateral primary osteoarthritis, left hip: Secondary | ICD-10-CM | POA: Diagnosis not present

## 2017-09-19 DIAGNOSIS — M1711 Unilateral primary osteoarthritis, right knee: Secondary | ICD-10-CM | POA: Diagnosis not present

## 2017-09-19 DIAGNOSIS — M1612 Unilateral primary osteoarthritis, left hip: Secondary | ICD-10-CM | POA: Diagnosis not present

## 2019-05-26 DIAGNOSIS — Z96642 Presence of left artificial hip joint: Secondary | ICD-10-CM | POA: Diagnosis not present

## 2019-05-26 DIAGNOSIS — M19011 Primary osteoarthritis, right shoulder: Secondary | ICD-10-CM | POA: Diagnosis not present

## 2019-07-08 DIAGNOSIS — Z0000 Encounter for general adult medical examination without abnormal findings: Secondary | ICD-10-CM | POA: Diagnosis not present

## 2019-07-08 DIAGNOSIS — Z125 Encounter for screening for malignant neoplasm of prostate: Secondary | ICD-10-CM | POA: Diagnosis not present

## 2019-07-11 DIAGNOSIS — E7800 Pure hypercholesterolemia, unspecified: Secondary | ICD-10-CM | POA: Diagnosis not present

## 2019-07-11 DIAGNOSIS — Z0000 Encounter for general adult medical examination without abnormal findings: Secondary | ICD-10-CM | POA: Diagnosis not present

## 2019-07-11 DIAGNOSIS — R739 Hyperglycemia, unspecified: Secondary | ICD-10-CM | POA: Diagnosis not present

## 2019-07-11 DIAGNOSIS — Z125 Encounter for screening for malignant neoplasm of prostate: Secondary | ICD-10-CM | POA: Diagnosis not present

## 2019-09-27 DIAGNOSIS — Z7189 Other specified counseling: Secondary | ICD-10-CM | POA: Diagnosis not present

## 2019-09-27 DIAGNOSIS — Z20828 Contact with and (suspected) exposure to other viral communicable diseases: Secondary | ICD-10-CM | POA: Diagnosis not present

## 2019-10-01 DIAGNOSIS — M19011 Primary osteoarthritis, right shoulder: Secondary | ICD-10-CM | POA: Diagnosis not present

## 2019-10-06 ENCOUNTER — Other Ambulatory Visit: Payer: Self-pay | Admitting: Orthopedic Surgery

## 2019-10-06 DIAGNOSIS — M25511 Pain in right shoulder: Secondary | ICD-10-CM

## 2019-10-09 DIAGNOSIS — M25511 Pain in right shoulder: Secondary | ICD-10-CM | POA: Diagnosis not present

## 2019-10-10 ENCOUNTER — Ambulatory Visit
Admission: RE | Admit: 2019-10-10 | Discharge: 2019-10-10 | Disposition: A | Discharge status: Home / Self Care | Payer: BC Managed Care – PPO | Source: Ambulatory Visit | Attending: Orthopedic Surgery | Admitting: Orthopedic Surgery

## 2019-10-10 DIAGNOSIS — M25511 Pain in right shoulder: Secondary | ICD-10-CM

## 2019-10-10 DIAGNOSIS — M19011 Primary osteoarthritis, right shoulder: Secondary | ICD-10-CM | POA: Diagnosis not present

## 2019-10-10 DIAGNOSIS — Z01818 Encounter for other preprocedural examination: Secondary | ICD-10-CM | POA: Diagnosis not present

## 2019-10-15 DIAGNOSIS — M19011 Primary osteoarthritis, right shoulder: Secondary | ICD-10-CM | POA: Diagnosis not present

## 2019-10-20 ENCOUNTER — Other Ambulatory Visit: Payer: Self-pay | Admitting: Orthopedic Surgery

## 2019-10-22 ENCOUNTER — Other Ambulatory Visit: Payer: Self-pay | Admitting: Orthopedic Surgery

## 2019-10-29 DIAGNOSIS — R590 Localized enlarged lymph nodes: Secondary | ICD-10-CM | POA: Diagnosis not present

## 2019-11-04 ENCOUNTER — Other Ambulatory Visit: Payer: Self-pay | Admitting: Internal Medicine

## 2019-11-04 DIAGNOSIS — R590 Localized enlarged lymph nodes: Secondary | ICD-10-CM

## 2019-11-05 DIAGNOSIS — R599 Enlarged lymph nodes, unspecified: Secondary | ICD-10-CM | POA: Diagnosis not present

## 2019-11-05 DIAGNOSIS — I10 Essential (primary) hypertension: Secondary | ICD-10-CM | POA: Diagnosis not present

## 2019-11-05 DIAGNOSIS — G4730 Sleep apnea, unspecified: Secondary | ICD-10-CM | POA: Diagnosis not present

## 2019-11-05 DIAGNOSIS — R590 Localized enlarged lymph nodes: Secondary | ICD-10-CM | POA: Diagnosis not present

## 2019-11-05 NOTE — Patient Instructions (Addendum)
DUE TO COVID-19 ONLY ONE VISITOR IS ALLOWED TO COME WITH YOU AND STAY IN THE WAITING ROOM ONLY DURING PRE OP AND PROCEDURE DAY OF SURGERY. THE 1 VISITOR MAY VISIT WITH YOU AFTER SURGERY IN YOUR PRIVATE ROOM DURING VISITING HOURS ONLY!   ONCE YOUR COVID TEST IS COMPLETED, PLEASE BEGIN THE QUARANTINE INSTRUCTIONS AS OUTLINED IN YOUR HANDOUT.                Marc Watson     Your procedure is scheduled on: Thursday 11/13/2019   Report to [facility] Main  Entrance     Report to Short Stay at [place]  AM     Call this number if you have problems the morning of surgery [phone]    Remember: Do not eat food  :After Midnight.   NO SOLID FOOD AFTER MIDNIGHT THE NIGHT PRIOR TO SURGERY. NOTHING BY MOUTH EXCEPT CLEAR LIQUIDS UNTIL 0430 am .     PLEASE FINISH ENSURE DRINK PER SURGEON ORDER  WHICH NEEDS TO BE COMPLETED AT   0430 am.   CLEAR LIQUID DIET   Foods Allowed                                                                     Foods Excluded  Coffee and tea, regular and decaf                             liquids that you cannot  Plain Jell-O any favor except red or purple                                           see through such as: Fruit ices (not with fruit pulp)                                     milk, soups, orange juice  Iced Popsicles                                    All solid food Carbonated beverages, regular and diet                                    Cranberry, grape and apple juices Sports drinks like Gatorade Lightly seasoned clear broth or consume(fat free) Sugar, honey syrup  Sample Menu Breakfast                                Lunch                                     Supper Cranberry juice                    Beef broth  Chicken broth Jell-O                                     Grape juice                           Apple juice Coffee or tea                        Jell-O                                      Popsicle                                          Coffee or tea                        Coffee or tea  _____________________________________________________________________      BRUSH YOUR TEETH MORNING OF SURGERY AND RINSE YOUR MOUTH OUT, NO CHEWING GUM CANDY OR MINTS.     Take these medicines the morning of surgery with A SIP OF WATER: none                                 You may not have any metal on your body including hair pins and              piercings  Do not wear jewelry, make-up, lotions, powders or perfumes, deodorant                          Men may shave face and neck.   Do not bring valuables to the hospital. [place].  Contacts, dentures or bridgework may not be worn into surgery.  Leave suitcase in the car. After surgery it may be brought to your room.                   Please read over the following fact sheets you were given: _____________________________________________________________________  [facility] - Preparing for Surgery Before surgery, you can play an important role.  Because skin is not sterile, your skin needs to be as free of germs as possible.  You can reduce the number of germs on your skin by washing with CHG (chlorahexidine gluconate) soap before surgery.  CHG is an antiseptic cleaner which kills germs and bonds with the skin to continue killing germs even after washing. Please DO NOT use if you have an allergy to CHG or antibacterial soaps.  If your skin becomes reddened/irritated stop using the CHG and inform your nurse when you arrive at Short Stay. Do not shave (including legs and underarms) for at least 48 hours prior to the first CHG shower.  You may shave your face/neck. Please follow these instructions carefully:  1.  Shower with CHG Soap the night before surgery and the  morning of Surgery.  2.  If you choose to wash your hair, wash your hair first as usual  with your  normal  shampoo.  3.  After  you shampoo, rinse your hair and body thoroughly to remove the  shampoo.                           4.  Use CHG as you would any other liquid soap.  You can apply chg directly  to the skin and wash                       Gently with a scrungie or clean washcloth.  5.  Apply the CHG Soap to your body ONLY FROM THE NECK DOWN.   Do not use on face/ open                           Wound or open sores. Avoid contact with eyes, ears mouth and genitals (private parts).                       Wash face,  Genitals (private parts) with your normal soap.             6.  Wash thoroughly, paying special attention to the area where your surgery  will be performed.  7.  Thoroughly rinse your body with warm water from the neck down.  8.  DO NOT shower/wash with your normal soap after using and rinsing off  the CHG Soap.                9.  Pat yourself dry with a clean towel.            10.  Wear clean pajamas.            11.  Place clean sheets on your bed the night of your first shower and do not  sleep with pets. Day of Surgery : Do not apply any lotions/deodorants the morning of surgery.  Please wear clean clothes to the hospital/surgery center.  FAILURE TO FOLLOW THESE INSTRUCTIONS MAY RESULT IN THE CANCELLATION OF YOUR SURGERY PATIENT SIGNATURE_________________________________  NURSE SIGNATURE__________________________________  ________________________________________________________________________  WHAT IS A BLOOD TRANSFUSION? Blood Transfusion Information  A transfusion is the replacement of blood or some of its parts. Blood is made up of multiple cells which provide different functions.  Red blood cells carry oxygen and are used for blood loss replacement.  White blood cells fight against infection.  Platelets control bleeding.  Plasma helps clot blood.  Other blood products are available for specialized needs, such as hemophilia or other clotting disorders. BEFORE THE TRANSFUSION  Who gives  blood for transfusions?   Healthy volunteers who are fully evaluated to make sure their blood is safe. This is blood bank blood. Transfusion therapy is the safest it has ever been in the practice of medicine. Before blood is taken from a donor, a complete history is taken to make sure that person has no history of diseases nor engages in risky social behavior (examples are intravenous drug use or sexual activity with multiple partners). The donor's travel history is screened to minimize risk of transmitting infections, such as malaria. The donated blood is tested for signs of infectious diseases, such as HIV and hepatitis. The blood is then tested to be sure it is compatible with you in order to minimize the chance of a transfusion reaction. If you or a relative donates blood, this is often done in  anticipation of surgery and is not appropriate for emergency situations. It takes many days to process the donated blood. RISKS AND COMPLICATIONS Although transfusion therapy is very safe and saves many lives, the main dangers of transfusion include:   Getting an infectious disease.  Developing a transfusion reaction. This is an allergic reaction to something in the blood you were given. Every precaution is taken to prevent this. The decision to have a blood transfusion has been considered carefully by your caregiver before blood is given. Blood is not given unless the benefits outweigh the risks. AFTER THE TRANSFUSION  Right after receiving a blood transfusion, you will usually feel much better and more energetic. This is especially true if your red blood cells have gotten low (anemic). The transfusion raises the level of the red blood cells which carry oxygen, and this usually causes an energy increase.  The nurse administering the transfusion will monitor you carefully for complications. HOME CARE INSTRUCTIONS  No special instructions are needed after a transfusion. You may find your energy is better.  Speak with your caregiver about any limitations on activity for underlying diseases you may have. SEEK MEDICAL CARE IF:   Your condition is not improving after your transfusion.  You develop redness or irritation at the intravenous (IV) site. SEEK IMMEDIATE MEDICAL CARE IF:  Any of the following symptoms occur over the next 12 hours:  Shaking chills.  You have a temperature by mouth above 102 F (38.9 C), not controlled by medicine.  Chest, back, or muscle pain.  People around you feel you are not acting correctly or are confused.  Shortness of breath or difficulty breathing.  Dizziness and fainting.  You get a rash or develop hives.  You have a decrease in urine output.  Your urine turns a dark color or changes to pink, red, or brown. Any of the following symptoms occur over the next 10 days:  You have a temperature by mouth above 102 F (38.9 C), not controlled by medicine.  Shortness of breath.  Weakness after normal activity.  The white part of the eye turns yellow (jaundice).  You have a decrease in the amount of urine or are urinating less often.  Your urine turns a dark color or changes to pink, red, or brown. Document Released: 11/24/2000 Document Revised: 02/19/2012 Document Reviewed: 07/13/2008 [facility] Patient Information 2014 [place], [place].  _______________________________________________________________________

## 2019-11-10 ENCOUNTER — Other Ambulatory Visit (HOSPITAL_COMMUNITY)
Admission: RE | Admit: 2019-11-10 | Discharge: 2019-11-10 | Disposition: A | Discharge status: Home / Self Care | Payer: BC Managed Care – PPO | Source: Ambulatory Visit | Attending: Orthopedic Surgery | Admitting: Orthopedic Surgery

## 2019-11-10 ENCOUNTER — Inpatient Hospital Stay: Admission: RE | Admit: 2019-11-10 | Payer: BC Managed Care – PPO | Source: Ambulatory Visit

## 2019-11-10 DIAGNOSIS — Z01812 Encounter for preprocedural laboratory examination: Secondary | ICD-10-CM | POA: Diagnosis not present

## 2019-11-10 DIAGNOSIS — Z20828 Contact with and (suspected) exposure to other viral communicable diseases: Secondary | ICD-10-CM | POA: Diagnosis not present

## 2019-11-11 ENCOUNTER — Encounter (HOSPITAL_COMMUNITY)
Admission: RE | Admit: 2019-11-11 | Discharge: 2019-11-11 | Disposition: A | Discharge status: Home / Self Care | Payer: BC Managed Care – PPO | Source: Ambulatory Visit | Attending: Orthopedic Surgery | Admitting: Orthopedic Surgery

## 2019-11-11 ENCOUNTER — Other Ambulatory Visit: Payer: Self-pay

## 2019-11-11 ENCOUNTER — Ambulatory Visit (HOSPITAL_COMMUNITY)
Admission: RE | Admit: 2019-11-11 | Discharge: 2019-11-11 | Disposition: A | Discharge status: Home / Self Care | Payer: BC Managed Care – PPO | Source: Ambulatory Visit | Attending: Orthopedic Surgery | Admitting: Orthopedic Surgery

## 2019-11-11 ENCOUNTER — Encounter (HOSPITAL_COMMUNITY): Payer: Self-pay

## 2019-11-11 DIAGNOSIS — Z01811 Encounter for preprocedural respiratory examination: Secondary | ICD-10-CM | POA: Insufficient documentation

## 2019-11-11 DIAGNOSIS — M19011 Primary osteoarthritis, right shoulder: Secondary | ICD-10-CM | POA: Diagnosis not present

## 2019-11-11 DIAGNOSIS — J449 Chronic obstructive pulmonary disease, unspecified: Secondary | ICD-10-CM | POA: Diagnosis not present

## 2019-11-11 DIAGNOSIS — G8918 Other acute postprocedural pain: Secondary | ICD-10-CM | POA: Diagnosis not present

## 2019-11-11 DIAGNOSIS — K219 Gastro-esophageal reflux disease without esophagitis: Secondary | ICD-10-CM | POA: Diagnosis not present

## 2019-11-11 DIAGNOSIS — Z79899 Other long term (current) drug therapy: Secondary | ICD-10-CM | POA: Diagnosis not present

## 2019-11-11 DIAGNOSIS — Z01818 Encounter for other preprocedural examination: Secondary | ICD-10-CM | POA: Diagnosis not present

## 2019-11-11 DIAGNOSIS — F17210 Nicotine dependence, cigarettes, uncomplicated: Secondary | ICD-10-CM | POA: Diagnosis not present

## 2019-11-11 DIAGNOSIS — M25711 Osteophyte, right shoulder: Secondary | ICD-10-CM | POA: Diagnosis not present

## 2019-11-11 DIAGNOSIS — R590 Localized enlarged lymph nodes: Secondary | ICD-10-CM | POA: Diagnosis not present

## 2019-11-11 DIAGNOSIS — M25611 Stiffness of right shoulder, not elsewhere classified: Secondary | ICD-10-CM | POA: Diagnosis not present

## 2019-11-11 DIAGNOSIS — Z471 Aftercare following joint replacement surgery: Secondary | ICD-10-CM | POA: Diagnosis not present

## 2019-11-11 DIAGNOSIS — Z96642 Presence of left artificial hip joint: Secondary | ICD-10-CM | POA: Diagnosis not present

## 2019-11-11 DIAGNOSIS — I10 Essential (primary) hypertension: Secondary | ICD-10-CM | POA: Diagnosis not present

## 2019-11-11 DIAGNOSIS — Z96611 Presence of right artificial shoulder joint: Secondary | ICD-10-CM | POA: Diagnosis not present

## 2019-11-11 HISTORY — DX: Essential (primary) hypertension: I10

## 2019-11-11 HISTORY — DX: Chronic obstructive pulmonary disease, unspecified: J44.9

## 2019-11-11 LAB — CBC WITH DIFFERENTIAL/PLATELET
Abs Immature Granulocytes: 0.05 10*3/uL (ref 0.00–0.07)
Basophils Absolute: 0.1 10*3/uL (ref 0.0–0.1)
Basophils Relative: 1 %
Eosinophils Absolute: 0.2 10*3/uL (ref 0.0–0.5)
Eosinophils Relative: 2 %
HCT: 45.2 % (ref 39.0–52.0)
Hemoglobin: 14.6 g/dL (ref 13.0–17.0)
Immature Granulocytes: 1 %
Lymphocytes Relative: 32 %
Lymphs Abs: 3.2 10*3/uL (ref 0.7–4.0)
MCH: 29.5 pg (ref 26.0–34.0)
MCHC: 32.3 g/dL (ref 30.0–36.0)
MCV: 91.3 fL (ref 80.0–100.0)
Monocytes Absolute: 0.9 10*3/uL (ref 0.1–1.0)
Monocytes Relative: 9 %
Neutro Abs: 5.5 10*3/uL (ref 1.7–7.7)
Neutrophils Relative %: 55 %
Platelets: 292 10*3/uL (ref 150–400)
RBC: 4.95 MIL/uL (ref 4.22–5.81)
RDW: 13 % (ref 11.5–15.5)
WBC: 10 10*3/uL (ref 4.0–10.5)
nRBC: 0 % (ref 0.0–0.2)

## 2019-11-11 LAB — COMPREHENSIVE METABOLIC PANEL
ALT: 23 U/L (ref 0–44)
AST: 18 U/L (ref 15–41)
Albumin: 4.1 g/dL (ref 3.5–5.0)
Alkaline Phosphatase: 90 U/L (ref 38–126)
Anion gap: 7 (ref 5–15)
BUN: 16 mg/dL (ref 8–23)
CO2: 25 mmol/L (ref 22–32)
Calcium: 9.2 mg/dL (ref 8.9–10.3)
Chloride: 106 mmol/L (ref 98–111)
Creatinine, Ser: 0.96 mg/dL (ref 0.61–1.24)
GFR calc Af Amer: >60 mL/min (ref >60)
GFR calc non Af Amer: >60 mL/min (ref >60)
Glucose, Bld: 105 mg/dL — ABNORMAL HIGH (ref 70–99)
Potassium: 4 mmol/L (ref 3.5–5.1)
Sodium: 138 mmol/L (ref 135–145)
Total Bilirubin: 0.6 mg/dL (ref 0.3–1.2)
Total Protein: 7 g/dL (ref 6.5–8.1)

## 2019-11-11 LAB — URINALYSIS, ROUTINE W REFLEX MICROSCOPIC
Bilirubin Urine: NEGATIVE
Glucose, UA: NEGATIVE mg/dL
Hgb urine dipstick: NEGATIVE
Ketones, ur: NEGATIVE mg/dL
Leukocytes,Ua: NEGATIVE
Nitrite: NEGATIVE
Protein, ur: NEGATIVE mg/dL
Specific Gravity, Urine: 1.015 (ref 1.005–1.030)
pH: 5 (ref 5.0–8.0)

## 2019-11-11 LAB — SURGICAL PCR SCREEN
MRSA, PCR: NEGATIVE
Staphylococcus aureus: POSITIVE — AB

## 2019-11-11 LAB — APTT: aPTT: 30 seconds (ref 24–36)

## 2019-11-11 LAB — NOVEL CORONAVIRUS, NAA (HOSP ORDER, SEND-OUT TO REF LAB; TAT 18-24 HRS): SARS-CoV-2, NAA: NOT DETECTED

## 2019-11-11 LAB — PROTIME-INR
INR: 0.9 (ref 0.8–1.2)
Prothrombin Time: 12.5 seconds (ref 11.4–15.2)

## 2019-11-11 LAB — ABO/RH: ABO/RH(D): O POS

## 2019-11-11 NOTE — Progress Notes (Signed)
PCP - Dr. [name] Cardiologist - N/A  Chest x-ray - N/A EKG -11/11/2019 EPIC  Stress Test - many years ago referred by Dr. Maudie Mercury due to his age-does not remember when or where ECHO - N/A Cardiac Cath - N/A  Sleep Study - to be scheduled for a Sleep Study when he goes to his appointment with Dr. Maudie Mercury tomorrow 11/12/2019 CPAP - None  Fasting Blood Sugar - N/A Checks Blood Sugar __0___ times a day  Blood Thinner Instructions:N/A Aspirin Instructions:N/A Last Dose:N/A  Anesthesia review:   Patient has a history of GERD and arthritis.  Patient denies shortness of breath, fever, cough and chest pain at PAT appointment   Patient verbalized understanding of instructions that were given to them at the PAT appointment. Patient was also instructed that they will need to review over the PAT instructions again at home before surgery.

## 2019-11-11 NOTE — Progress Notes (Signed)
   11/11/19 0838  OBSTRUCTIVE SLEEP APNEA  Have you ever been diagnosed with sleep apnea through a sleep study? No  Do you snore loudly (loud enough to be heard through closed doors)?  1  Do you often feel tired, fatigued, or sleepy during the daytime (such as falling asleep during driving or talking to someone)? 1  Has anyone observed you stop breathing during your sleep? 1  Do you have, or are you being treated for high blood pressure? 1  BMI more than 35 kg/m2? 0  Age > 50 (1-yes) 1  Neck circumference greater than:Male 16 inches or larger, Male 17inches or larger? 0  Male Gender (Yes=1) 1  Obstructive Sleep Apnea Score 6  Score 5 or greater  Results sent to PCP

## 2019-11-12 DIAGNOSIS — I10 Essential (primary) hypertension: Secondary | ICD-10-CM | POA: Diagnosis not present

## 2019-11-12 DIAGNOSIS — R590 Localized enlarged lymph nodes: Secondary | ICD-10-CM | POA: Diagnosis not present

## 2019-11-12 NOTE — Anesthesia Preprocedure Evaluation (Addendum)
Anesthesia Evaluation  Patient identified by MRN, date of birth, ID band Patient awake    Reviewed: Allergy & Precautions, NPO status , Patient's Chart, lab work & pertinent test results  Airway Mallampati: II  TM Distance: >3 FB Neck ROM: Full    Dental  (+) Dental Advisory Given, Teeth Intact   Pulmonary COPD, Current Smoker,    Pulmonary exam normal breath sounds clear to auscultation       Cardiovascular hypertension, Normal cardiovascular exam Rhythm:Regular Rate:Normal     Neuro/Psych negative neurological ROS  negative psych ROS   GI/Hepatic Neg liver ROS, GERD  ,  Endo/Other  negative endocrine ROS  Renal/GU negative Renal ROS     Musculoskeletal  (+) Arthritis ,   Abdominal   Peds  Hematology negative hematology ROS (+)   Anesthesia Other Findings   Reproductive/Obstetrics negative OB ROS                            Anesthesia Physical  Anesthesia Plan  ASA: II  Anesthesia Plan: General   Post-op Pain Management: GA combined w/ Regional for post-op pain   Induction: Intravenous  PONV Risk Score and Plan: 1 and Ondansetron, Dexamethasone and Treatment may vary due to age or medical condition  Airway Management Planned: Oral ETT  Additional Equipment:   Intra-op Plan:   Post-operative Plan: Extubation in OR  Informed Consent: I have reviewed the patients History and Physical, chart, labs and discussed the procedure including the risks, benefits and alternatives for the proposed anesthesia with the patient or authorized representative who has indicated his/her understanding and acceptance.     Dental advisory given  Plan Discussed with: CRNA  Anesthesia Plan Comments:         Anesthesia Quick Evaluation

## 2019-11-13 ENCOUNTER — Encounter (HOSPITAL_COMMUNITY)
Admission: RE | Disposition: A | Discharge status: Home / Self Care | Payer: Self-pay | Source: Home / Self Care | Attending: Orthopedic Surgery

## 2019-11-13 ENCOUNTER — Inpatient Hospital Stay (HOSPITAL_COMMUNITY): Payer: BC Managed Care – PPO | Admitting: Anesthesiology

## 2019-11-13 ENCOUNTER — Other Ambulatory Visit: Payer: Self-pay | Admitting: Orthopedic Surgery

## 2019-11-13 ENCOUNTER — Inpatient Hospital Stay (HOSPITAL_COMMUNITY): Payer: BC Managed Care – PPO | Admitting: Physician Assistant

## 2019-11-13 ENCOUNTER — Other Ambulatory Visit: Payer: Self-pay

## 2019-11-13 ENCOUNTER — Inpatient Hospital Stay (HOSPITAL_COMMUNITY): Payer: BC Managed Care – PPO

## 2019-11-13 ENCOUNTER — Inpatient Hospital Stay (HOSPITAL_COMMUNITY)
Admission: RE | Admit: 2019-11-13 | Discharge: 2019-11-13 | DRG: 483 | Disposition: A | Discharge status: Home / Self Care | Payer: BC Managed Care – PPO | Attending: Orthopedic Surgery | Admitting: Orthopedic Surgery

## 2019-11-13 ENCOUNTER — Encounter (HOSPITAL_COMMUNITY): Payer: Self-pay | Admitting: *Deleted

## 2019-11-13 DIAGNOSIS — Z96611 Presence of right artificial shoulder joint: Secondary | ICD-10-CM

## 2019-11-13 DIAGNOSIS — M19011 Primary osteoarthritis, right shoulder: Secondary | ICD-10-CM | POA: Diagnosis not present

## 2019-11-13 DIAGNOSIS — I10 Essential (primary) hypertension: Secondary | ICD-10-CM | POA: Diagnosis not present

## 2019-11-13 DIAGNOSIS — J449 Chronic obstructive pulmonary disease, unspecified: Secondary | ICD-10-CM | POA: Diagnosis not present

## 2019-11-13 DIAGNOSIS — Z471 Aftercare following joint replacement surgery: Secondary | ICD-10-CM | POA: Diagnosis not present

## 2019-11-13 DIAGNOSIS — F17210 Nicotine dependence, cigarettes, uncomplicated: Secondary | ICD-10-CM | POA: Diagnosis present

## 2019-11-13 DIAGNOSIS — M25711 Osteophyte, right shoulder: Secondary | ICD-10-CM | POA: Diagnosis present

## 2019-11-13 DIAGNOSIS — G8918 Other acute postprocedural pain: Secondary | ICD-10-CM | POA: Diagnosis not present

## 2019-11-13 DIAGNOSIS — Z79899 Other long term (current) drug therapy: Secondary | ICD-10-CM

## 2019-11-13 DIAGNOSIS — Z96642 Presence of left artificial hip joint: Secondary | ICD-10-CM | POA: Diagnosis present

## 2019-11-13 DIAGNOSIS — K219 Gastro-esophageal reflux disease without esophagitis: Secondary | ICD-10-CM | POA: Diagnosis present

## 2019-11-13 HISTORY — PX: TOTAL SHOULDER ARTHROPLASTY: SHX126

## 2019-11-13 LAB — TYPE AND SCREEN
ABO/RH(D): O POS
Antibody Screen: NEGATIVE

## 2019-11-13 SURGERY — ARTHROPLASTY, SHOULDER, TOTAL
Anesthesia: General | Site: Shoulder | Laterality: Right

## 2019-11-13 MED ORDER — ROCURONIUM BROMIDE 10 MG/ML (PF) SYRINGE
PREFILLED_SYRINGE | INTRAVENOUS | Status: AC
  Filled 2019-11-13: qty 10

## 2019-11-13 MED ORDER — ONDANSETRON HCL 4 MG PO TABS: 4.0000 mg | ORAL_TABLET | Freq: Four times a day (QID) | ORAL | Status: DC | PRN

## 2019-11-13 MED ORDER — METOCLOPRAMIDE HCL 5 MG/ML IJ SOLN: 5.0000 mg | Freq: Three times a day (TID) | INTRAMUSCULAR | Status: DC | PRN

## 2019-11-13 MED ORDER — FENTANYL CITRATE (PF) 100 MCG/2ML IJ SOLN: 25.0000 ug | INTRAMUSCULAR | Status: DC | PRN

## 2019-11-13 MED ORDER — ACETAMINOPHEN 500 MG PO TABS: 1000.0000 mg | ORAL_TABLET | Freq: Four times a day (QID) | ORAL | Status: DC

## 2019-11-13 MED ORDER — OXYCODONE-ACETAMINOPHEN 5-325 MG PO TABS: ORAL_TABLET | ORAL | 0 refills | Status: DC

## 2019-11-13 MED ORDER — PROPOFOL 10 MG/ML IV BOLUS
INTRAVENOUS | Status: AC
  Filled 2019-11-13: qty 20

## 2019-11-13 MED ORDER — DEXAMETHASONE SODIUM PHOSPHATE 10 MG/ML IJ SOLN
INTRAMUSCULAR | Status: DC | PRN
  Administered 2019-11-13: 10 mg via INTRAVENOUS

## 2019-11-13 MED ORDER — FENTANYL CITRATE (PF) 100 MCG/2ML IJ SOLN
INTRAMUSCULAR | Status: DC | PRN
  Administered 2019-11-13 (×2): 50 ug via INTRAVENOUS

## 2019-11-13 MED ORDER — BUPIVACAINE HCL (PF) 0.5 % IJ SOLN
INTRAMUSCULAR | Status: DC | PRN
  Administered 2019-11-13: 10 mL via PERINEURAL

## 2019-11-13 MED ORDER — EPHEDRINE 5 MG/ML INJ
INTRAVENOUS | Status: AC
  Filled 2019-11-13: qty 10

## 2019-11-13 MED ORDER — SODIUM CHLORIDE 0.9 % IV SOLN: INTRAVENOUS | Status: DC

## 2019-11-13 MED ORDER — LACTATED RINGERS IV SOLN
INTRAVENOUS | Status: DC
  Administered 2019-11-13 (×2): via INTRAVENOUS

## 2019-11-13 MED ORDER — SODIUM CHLORIDE 0.9 % IR SOLN
Status: DC | PRN
  Administered 2019-11-13 (×2): 1000 mL

## 2019-11-13 MED ORDER — SUGAMMADEX SODIUM 200 MG/2ML IV SOLN
INTRAVENOUS | Status: DC | PRN
  Administered 2019-11-13: 200 mg via INTRAVENOUS

## 2019-11-13 MED ORDER — MENTHOL 3 MG MT LOZG: 1.0000 | LOZENGE | OROMUCOSAL | Status: DC | PRN

## 2019-11-13 MED ORDER — MIDAZOLAM HCL 2 MG/2ML IJ SOLN
INTRAMUSCULAR | Status: AC
  Filled 2019-11-13: qty 2

## 2019-11-13 MED ORDER — OXYCODONE HCL 5 MG PO TABS: 5.0000 mg | ORAL_TABLET | ORAL | Status: DC | PRN

## 2019-11-13 MED ORDER — FENTANYL CITRATE (PF) 100 MCG/2ML IJ SOLN
INTRAMUSCULAR | Status: AC
  Filled 2019-11-13: qty 2

## 2019-11-13 MED ORDER — DEXAMETHASONE SODIUM PHOSPHATE 10 MG/ML IJ SOLN
INTRAMUSCULAR | Status: AC
  Filled 2019-11-13: qty 1

## 2019-11-13 MED ORDER — DIPHENHYDRAMINE HCL 12.5 MG/5ML PO ELIX: 12.5000 mg | ORAL_SOLUTION | ORAL | Status: DC | PRN

## 2019-11-13 MED ORDER — PHENOL 1.4 % MT LIQD: 1.0000 | OROMUCOSAL | Status: DC | PRN

## 2019-11-13 MED ORDER — MIDAZOLAM HCL 5 MG/5ML IJ SOLN
INTRAMUSCULAR | Status: DC | PRN
  Administered 2019-11-13: 2 mg via INTRAVENOUS

## 2019-11-13 MED ORDER — ASPIRIN EC 325 MG PO TBEC: 325.0000 mg | DELAYED_RELEASE_TABLET | Freq: Every day | ORAL | Status: DC

## 2019-11-13 MED ORDER — OXYCODONE HCL 5 MG PO TABS: 10.0000 mg | ORAL_TABLET | ORAL | Status: DC | PRN

## 2019-11-13 MED ORDER — TRANEXAMIC ACID-NACL 1000-0.7 MG/100ML-% IV SOLN
INTRAVENOUS | Status: AC
  Filled 2019-11-13: qty 100

## 2019-11-13 MED ORDER — LIDOCAINE 2% (20 MG/ML) 5 ML SYRINGE
INTRAMUSCULAR | Status: AC
  Filled 2019-11-13: qty 5

## 2019-11-13 MED ORDER — SUGAMMADEX SODIUM 500 MG/5ML IV SOLN
INTRAVENOUS | Status: AC
  Filled 2019-11-13: qty 5

## 2019-11-13 MED ORDER — ALUMINUM HYDROXIDE GEL 320 MG/5ML PO SUSP: 15.0000 mL | ORAL | Status: DC | PRN

## 2019-11-13 MED ORDER — CHLORHEXIDINE GLUCONATE 4 % EX LIQD: 60.0000 mL | Freq: Once | CUTANEOUS | Status: DC

## 2019-11-13 MED ORDER — DOCUSATE SODIUM 100 MG PO CAPS: 100.0000 mg | ORAL_CAPSULE | Freq: Two times a day (BID) | ORAL | Status: DC

## 2019-11-13 MED ORDER — CEFAZOLIN SODIUM-DEXTROSE 2-4 GM/100ML-% IV SOLN
INTRAVENOUS | Status: AC
  Filled 2019-11-13: qty 100

## 2019-11-13 MED ORDER — ONDANSETRON HCL 4 MG/2ML IJ SOLN
INTRAMUSCULAR | Status: DC | PRN
  Administered 2019-11-13: 4 mg via INTRAVENOUS

## 2019-11-13 MED ORDER — PHENYLEPHRINE 40 MCG/ML (10ML) SYRINGE FOR IV PUSH (FOR BLOOD PRESSURE SUPPORT)
PREFILLED_SYRINGE | INTRAVENOUS | Status: AC
  Filled 2019-11-13: qty 10

## 2019-11-13 MED ORDER — LIDOCAINE 2% (20 MG/ML) 5 ML SYRINGE
INTRAMUSCULAR | Status: DC | PRN
  Administered 2019-11-13: 100 mg via INTRAVENOUS

## 2019-11-13 MED ORDER — ONDANSETRON HCL 4 MG/2ML IJ SOLN
INTRAMUSCULAR | Status: AC
  Filled 2019-11-13: qty 2

## 2019-11-13 MED ORDER — HYDROMORPHONE HCL 1 MG/ML IJ SOLN: 0.5000 mg | INTRAMUSCULAR | Status: DC | PRN

## 2019-11-13 MED ORDER — PROPOFOL 10 MG/ML IV BOLUS
INTRAVENOUS | Status: DC | PRN
  Administered 2019-11-13: 160 mg via INTRAVENOUS

## 2019-11-13 MED ORDER — MEPERIDINE HCL 50 MG/ML IJ SOLN: 6.2500 mg | INTRAMUSCULAR | Status: DC | PRN

## 2019-11-13 MED ORDER — METOCLOPRAMIDE HCL 5 MG PO TABS: 5.0000 mg | ORAL_TABLET | Freq: Three times a day (TID) | ORAL | Status: DC | PRN

## 2019-11-13 MED ORDER — PROMETHAZINE HCL 25 MG/ML IJ SOLN: 6.2500 mg | INTRAMUSCULAR | Status: DC | PRN

## 2019-11-13 MED ORDER — TRANEXAMIC ACID-NACL 1000-0.7 MG/100ML-% IV SOLN
1000.0000 mg | INTRAVENOUS | Status: AC
  Administered 2019-11-13: 1000 mg via INTRAVENOUS

## 2019-11-13 MED ORDER — PHENYLEPHRINE 40 MCG/ML (10ML) SYRINGE FOR IV PUSH (FOR BLOOD PRESSURE SUPPORT)
PREFILLED_SYRINGE | INTRAVENOUS | Status: DC | PRN
  Administered 2019-11-13: 40 ug via INTRAVENOUS

## 2019-11-13 MED ORDER — CEFAZOLIN SODIUM-DEXTROSE 2-4 GM/100ML-% IV SOLN
2.0000 g | INTRAVENOUS | Status: AC
  Administered 2019-11-13: 2 g via INTRAVENOUS

## 2019-11-13 MED ORDER — ACETAMINOPHEN 325 MG PO TABS: 325.0000 mg | ORAL_TABLET | Freq: Four times a day (QID) | ORAL | Status: DC | PRN

## 2019-11-13 MED ORDER — ROCURONIUM BROMIDE 10 MG/ML (PF) SYRINGE
PREFILLED_SYRINGE | INTRAVENOUS | Status: DC | PRN
  Administered 2019-11-13: 20 mg via INTRAVENOUS
  Administered 2019-11-13: 50 mg via INTRAVENOUS
  Administered 2019-11-13: 10 mg via INTRAVENOUS

## 2019-11-13 MED ORDER — BUPIVACAINE LIPOSOME 1.3 % IJ SUSP
INTRAMUSCULAR | Status: DC | PRN
  Administered 2019-11-13: 10 mL via PERINEURAL

## 2019-11-13 MED ORDER — ONDANSETRON HCL 4 MG/2ML IJ SOLN: 4.0000 mg | Freq: Four times a day (QID) | INTRAMUSCULAR | Status: DC | PRN

## 2019-11-13 SURGICAL SUPPLY — 76 items
AID PSTN UNV HD RSTRNT DISP (MISCELLANEOUS) ×1
APL PRP STRL LF DISP 70% ISPRP (MISCELLANEOUS) ×2
BAG SPEC THK2 15X12 ZIP CLS (MISCELLANEOUS) ×1
BAG ZIPLOCK 12X15 (MISCELLANEOUS) ×2 IMPLANT
BIT DRILL 1.6MX128 (BIT) ×2 IMPLANT
BIT DRILL 2.4X128 (BIT) IMPLANT
BLADE SAW SAG 73X25 THK (BLADE) ×1
BLADE SAW SGTL 18X1.27X75 (BLADE) ×1 IMPLANT
BLADE SAW SGTL 73X25 THK (BLADE) ×1 IMPLANT
BOOTIES KNEE HIGH SLOAN (MISCELLANEOUS) ×2 IMPLANT
CEMENT BONE DEPUY (Cement) ×2 IMPLANT
CHLORAPREP W/TINT 26 (MISCELLANEOUS) ×4 IMPLANT
COOLER ICEMAN CLASSIC (MISCELLANEOUS) ×1 IMPLANT
CORTILOC GLENOID AUGMENT (Orthopedic Implant) ×1 IMPLANT
COVER BACK TABLE 60X90IN (DRAPES) ×2 IMPLANT
COVER SURGICAL LIGHT HANDLE (MISCELLANEOUS) ×2 IMPLANT
COVER WAND RF STERILE (DRAPES) IMPLANT
DRAPE INCISE IOBAN 66X45 STRL (DRAPES) ×2 IMPLANT
DRAPE ORTHO SPLIT 77X108 STRL (DRAPES) ×4
DRAPE POUCH INSTRU U-SHP 10X18 (DRAPES) ×2 IMPLANT
DRAPE SHEET LG 3/4 BI-LAMINATE (DRAPES) ×2 IMPLANT
DRAPE SURG 17X11 SM STRL (DRAPES) ×2 IMPLANT
DRAPE SURG ORHT 6 SPLT 77X108 (DRAPES) ×2 IMPLANT
DRAPE U-SHAPE 47X51 STRL (DRAPES) ×2 IMPLANT
DRSG AQUACEL AG ADV 3.5X 6 (GAUZE/BANDAGES/DRESSINGS) ×2 IMPLANT
ELECT BLADE TIP CTD 4 INCH (ELECTRODE) ×2 IMPLANT
ELECT REM PT RETURN 15FT ADLT (MISCELLANEOUS) ×2 IMPLANT
GLENOID AUGMENT CORT LG RT 25 (Shoulder) ×1 IMPLANT
GLOVE BIO SURGEON STRL SZ7 (GLOVE) ×2 IMPLANT
GLOVE BIO SURGEON STRL SZ7.5 (GLOVE) ×2 IMPLANT
GLOVE BIOGEL PI IND STRL 7.0 (GLOVE) ×1 IMPLANT
GLOVE BIOGEL PI IND STRL 8 (GLOVE) ×1 IMPLANT
GLOVE BIOGEL PI INDICATOR 7.0 (GLOVE) ×1
GLOVE BIOGEL PI INDICATOR 8 (GLOVE) ×1
GOWN STRL REUS W/TWL LRG LVL3 (GOWN DISPOSABLE) ×2 IMPLANT
GOWN STRL REUS W/TWL XL LVL3 (GOWN DISPOSABLE) ×2 IMPLANT
GUIDEWIRE GLENOID 2.5X220 (WIRE) ×1 IMPLANT
HANDPIECE INTERPULSE COAX TIP (DISPOSABLE) ×2
HEAD HUM 4XHI OFST 23X52X (Head) IMPLANT
HEAD HUMERAL AEQUALIS 52X23 (Head) ×2 IMPLANT
HEMOSTAT SURGICEL 4X8 (HEMOSTASIS) ×2 IMPLANT
HOOD PEEL AWAY FLYTE STAYCOOL (MISCELLANEOUS) ×6 IMPLANT
KIT BASIN OR (CUSTOM PROCEDURE TRAY) ×2 IMPLANT
KIT TURNOVER KIT A (KITS) IMPLANT
MANIFOLD NEPTUNE II (INSTRUMENTS) ×2 IMPLANT
NDL MA TROC 1/2 (NEEDLE) ×1 IMPLANT
NEEDLE MA TROC 1/2 (NEEDLE) ×2 IMPLANT
NS IRRIG 1000ML POUR BTL (IV SOLUTION) ×2 IMPLANT
PACK SHOULDER (CUSTOM PROCEDURE TRAY) ×2 IMPLANT
PAD COLD SHLDR WRAP-ON (PAD) ×1 IMPLANT
PENCIL SMOKE EVACUATOR (MISCELLANEOUS) IMPLANT
PROTECTOR NERVE ULNAR (MISCELLANEOUS) ×1 IMPLANT
RESTRAINT HEAD UNIVERSAL NS (MISCELLANEOUS) ×2 IMPLANT
RETRIEVER SUT HEWSON (MISCELLANEOUS) ×2 IMPLANT
SET HNDPC FAN SPRY TIP SCT (DISPOSABLE) ×1 IMPLANT
SLING ARM IMMOBILIZER LRG (SOFTGOODS) ×2 IMPLANT
SMARTMIX MINI TOWER (MISCELLANEOUS) ×2
SPONGE LAP 18X18 RF (DISPOSABLE) ×1 IMPLANT
STANDARD PTC HUMERAL STEM (Orthopedic Implant) ×1 IMPLANT
STEM HUMERAL FLEX 4C (Stem) ×1 IMPLANT
STRIP CLOSURE SKIN 1/2X4 (GAUZE/BANDAGES/DRESSINGS) ×2 IMPLANT
SUCTION FRAZIER HANDLE 12FR (TUBING) ×1
SUCTION TUBE FRAZIER 12FR DISP (TUBING) ×1 IMPLANT
SUPPORT WRAP ARM LG (MISCELLANEOUS) ×2 IMPLANT
SUT ETHIBOND 2 V 37 (SUTURE) ×2 IMPLANT
SUT MNCRL AB 4-0 PS2 18 (SUTURE) ×2 IMPLANT
SUT PDS AB 0 CT1 36 (SUTURE) ×1 IMPLANT
SUT VIC AB 2-0 CT1 27 (SUTURE) ×2
SUT VIC AB 2-0 CT1 TAPERPNT 27 (SUTURE) ×1 IMPLANT
TAPE LABRALWHITE 1.5X36 (TAPE) ×2 IMPLANT
TAPE SUT LABRALTAP WHT/BLK (SUTURE) ×2 IMPLANT
TOWEL OR 17X26 10 PK STRL BLUE (TOWEL DISPOSABLE) ×2 IMPLANT
TOWEL OR NON WOVEN STRL DISP B (DISPOSABLE) ×2 IMPLANT
TOWER SMARTMIX MINI (MISCELLANEOUS) IMPLANT
WATER STERILE IRR 1000ML POUR (IV SOLUTION) ×3 IMPLANT
YANKAUER SUCT BULB TIP 10FT TU (MISCELLANEOUS) ×2 IMPLANT

## 2019-11-13 NOTE — Progress Notes (Signed)
Spoke with Dr. Tamera Punt and patient is to be discharged home.  No occupational nor physical therapy is needed before discharge home.  Patient is aware of all therapy exercise instructions.  Patient was given detailed instructions on the use of the Iceman machine for home and positioning as per discharge instructions.  Inpatient orders were released but not needed.  Discharge order placed as per orders of Dr. Tamera Punt.

## 2019-11-13 NOTE — Progress Notes (Signed)
Patient ID: Marc Watson MRN: [id number] DOB/AGE: 1956-05-23 63 y.o.  Admit date: 11/13/2019 Discharge date: 11/13/2019  Admission Diagnoses:  Active Problems:   * No active hospital problems. *   Discharge Diagnoses:  Same  Past Medical History:  Diagnosis Date  . Arthritis   . COPD (chronic obstructive pulmonary disease) ([place])    per CXR on 11/11/2019  . GERD (gastroesophageal reflux disease)    occ  . Hypertension    recently started taking on 11/05/2019    Surgeries: Procedure(s): TOTAL SHOULDER ARTHROPLASTY on 11/13/2019   Consultants:   Discharged Condition: Improved  Hospital Course: Marc Watson is an 63 y.o. male who was admitted 11/13/2019 for operative treatment of right shoulder end stage arthritis. Patient has severe unremitting pain that affects sleep, daily activities, and work/hobbies. After pre-op clearance the patient was taken to the operating room on 11/13/2019 and underwent  Procedure(s): TOTAL SHOULDER ARTHROPLASTY.    Patient was given perioperative antibiotics:  Anti-infectives (From admission, onward)   Start     Dose/Rate Route Frequency Ordered Stop   11/13/19 0600  ceFAZolin (ANCEF) IVPB 2g/100 mL premix     2 g 200 mL/hr over 30 Minutes Intravenous On call to O.R. 11/13/19 0533 11/13/19 0727   11/13/19 0535  ceFAZolin (ANCEF) 2-4 GM/100ML-% IVPB    Note to Pharmacy: Randa Evens  : cabinet override      11/13/19 0535 11/13/19 0727       Patient was given sequential compression devices, early ambulation, and chemoprophylaxis to prevent DVT.  Patient recovered well from surgery in PACU and was felt to be stable to d/c home.   Recent vital signs:  Patient Vitals for the past 24 hrs:  BP Temp Temp src Pulse Resp SpO2 Height Weight  11/13/19 1002 (!) 114/58 98.1 F (36.7 C) - 92 13 95 % - -  11/13/19 0606 140/87 98.2 F (36.8 C) Oral 81 10 96 % - -  11/13/19 0540 - - - - - - 6\' 1"  (1.854 m) 111.1 kg     Recent laboratory  studies:  Recent Labs    11/11/19 0851  WBC 10.0  HGB 14.6  HCT 45.2  PLT 292  NA 138  K 4.0  CL 106  CO2 25  BUN 16  CREATININE 0.96  GLUCOSE 105*  INR 0.9  CALCIUM 9.2     Discharge Medications:   Allergies as of 11/13/2019      Reactions   No Known Allergies       Medication List    STOP taking these medications   naproxen 500 MG tablet Commonly known as: NAPROSYN     TAKE these medications   cyclobenzaprine 10 MG tablet Commonly known as: FLEXERIL Take 10 mg by mouth at bedtime as needed for muscle spasms.   FISH OIL PO Take 2 capsules by mouth daily. OMEGA XL PROPRIETARY BLEND 300 MG d-ALPHA TOCOPHEROL (VITAMIN E)/EXTRA VIRGIN OLIVE OIL/EXTRACT (pcso-524) CONTAINING OMEGA FATTY ACIDS, GREEN LIPPED MUSSEL (PERNA CANLICULUS) OIL)   losartan 25 MG tablet Commonly known as: COZAAR Take 25 mg by mouth daily.   oxyCODONE-acetaminophen 5-325 MG tablet Commonly known as: Percocet Take 1-2 tablets every 4 hours as needed for post operative pain. MAX 6/day       Diagnostic Studies: Dg Chest 2 View  Result Date: 11/11/2019 CLINICAL DATA:  Pre-op respiratory exam for right shoulder replacement. EXAM: CHEST - 2 VIEW COMPARISON:  11/05/2019 FINDINGS: Heart size is normal. Stable ectasia of  the thoracic aorta. Aortic atherosclerosis. Stable pulmonary hyperinflation, consistent with COPD. No evidence of pulmonary infiltrate or edema. No evidence of pleural effusion. IMPRESSION: COPD.  No active cardiopulmonary disease. Electronically Signed   By: Marlaine Hind M.D.   On: 11/11/2019 10:02    Disposition: Discharge disposition: 01-Home or Self Care       Discharge Instructions    Call MD / Call 911   Complete by: As directed    If you experience chest pain or shortness of breath, CALL 911 and be transported to the hospital emergency room.  If you develope a fever above 101 F, pus (white drainage) or increased drainage or redness at the wound, or calf pain, call  your surgeon's office.   Constipation Prevention   Complete by: As directed    Drink plenty of fluids.  Prune juice may be helpful.  You may use a stool softener, such as Colace (over the counter) 100 mg twice a day.  Use MiraLax (over the counter) for constipation as needed.   Diet - low sodium heart healthy   Complete by: As directed    Increase activity slowly as tolerated   Complete by: As directed       Follow-up Information    Tania Ade, MD. Schedule an appointment as soon as possible for a visit in 2 weeks.   Specialty: Orthopedic Surgery Contact information: [address] [place] [address] [phone]            Signed: Grier Mitts 11/13/2019, 10:08 AM   [phone]

## 2019-11-13 NOTE — Transfer of Care (Signed)
Immediate Anesthesia Transfer of Care Note  Patient: Marc Watson  Procedure(s) Performed: Procedure(s): TOTAL SHOULDER ARTHROPLASTY (Right)  Patient Location: PACU  Anesthesia Type:General  Level of Consciousness: Alert, Awake, Oriented  Airway & Oxygen Therapy: Patient Spontanous Breathing  Post-op Assessment: Report given to RN  Post vital signs: Reviewed and stable  Last Vitals:  Vitals:   11/13/19 0606 11/13/19 1002  BP: 140/87 (!) 114/58  Pulse: 81 92  Resp: 10 13  Temp: 36.8 C 36.7 C  SpO2: 0000000 [id number]    Complications: No apparent anesthesia complications

## 2019-11-13 NOTE — Op Note (Signed)
Procedure(s): TOTAL SHOULDER ARTHROPLASTY Procedure Note  DAVRON PANCIERA male 63 y.o. 11/13/2019  Procedure(s) and Anesthesia Type:    RIGHT TOTAL SHOULDER ARTHROPLASTY - Choice  Surgeon(s) and Role:    Tania Ade, MD - Primary   Indications:  63 y.o. 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 Pain and dysfunction interfered with quality of life and nonoperative treatment with activity modification, NSAIDS and injections failed.     Surgeon: Isabella Stalling   Assistants: Jeanmarie Hubert PA-C [facility] was present and scrubbed throughout the procedure and was essential in positioning, retraction, exposure, and closure)  Anesthesia: General endotracheal anesthesia with preoperative interscalene block given by the attending anesthesiologist    Procedure Detail  TOTAL SHOULDER ARTHROPLASTY  Findings: Tornier flex anatomic press-fit size 4 stem with a 52 x 21 high offset head, cemented size large 25 degree augmented performed plus Cortiloc glenoid.   A lesser tuberosity osteotomy was perform and repaired at the conclusion of the procedure.  He had a very tight shoulder with very large osteophytes.  At the conclusion of the procedure after removing all the osteophytes the shoulder felt a little bit loose posteriorly.  The posterior capsular plication stitch was placed.  Estimated Blood Loss:  200 mL         Drains: None   Blood Given: none          Specimens: none        Complications:  * No complications entered in OR log *         Disposition: PACU - hemodynamically stable.         Condition: stable    Procedure:   The patient was identified in the preoperative holding area where I personally marked the operative extremity after verifying with the patient and consent. He  was taken to the operating room where He was transferred to the   operative table.  The patient received an interscalene block in   the holding area by the attending  anesthesiologist.  General anesthesia was induced   in the operating room without complication.  The patient did receive IV  Ancef prior to the commencement of the procedure.  The patient was   placed in the beach-chair position with the back raised about 30   degrees.  The nonoperative extremity and head and neck were carefully   positioned and padded protecting against neurovascular compromise.  The   left upper extremity was then prepped and draped in the standard sterile   fashion.    The appropriate operative time-out was performed with   Anesthesia, the perioperative staff, as well as myself and we all agreed   that the right side was the correct operative site.  An approximately   10 cm incision was made from the tip of the coracoid to the center point of the   humerus at the level of the axilla.  Dissection was carried down sharply   through subcutaneous tissues and cephalic vein was identified and taken   laterally with the deltoid.  The pectoralis major was taken medially.  The   upper 1 cm of the pectoralis major was released from its attachment on   the humerus.  The clavipectoral fascia was incised just lateral to the   conjoined tendon.  This incision was carried up to but not into the   coracoacromial ligament.  Digital palpation was used to prove   integrity of the axillary nerve which was protected throughout  the   procedure.  Musculocutaneous nerve was not palpated in the operative   field.  Conjoined tendon was then retracted gently medially and the   deltoid laterally.  Anterior circumflex humeral vessels were clamped and   coagulated.  The soft tissues overlying the biceps was incised and this   incision was carried across the transverse humeral ligament to the base   of the coracoid.  The biceps was noted to be severely degenerated. It was released from the superior labrum. The biceps was then tenodesed to the soft tissue just above   pectoralis major and the remaining  portion of the biceps superiorly was   excised.  An osteotomy was performed at the lesser tuberosity.  The capsule was then   released all the way down to the 6 o'clock position of the humeral head.   The humeral head was then delivered with simultaneous adduction,   extension and external rotation.  All humeral osteophytes were removed   and the anatomic neck of the humerus was marked and cut free hand at   approximately 25 degrees retroversion within about 3 mm of the cuff   reflection posteriorly.  The head size was estimated to be a 52 medium   offset.  At that point, the humeral head was retracted posteriorly with   a Fukuda retractor.   Remaining portion of the capsule was released at the base of the   coracoid.  The remaining biceps anchor and the entire anterior-inferior   labrum was excised.  The posterior labrum was also excised but the   posterior capsule was not released.  The glenoid was noted to be B2 morphology.  the guidepin was placed bicortically with perform plus anterior glenoid referencing guide.  The reamer was used to ream to the halfway point anteriorly.  The anterior reference hole was then drilled.  The posterior angled reamer was then used starting at 15 degrees advancing to 25 until the posterior half of the glenoid was appropriately reamed.  The checker was used to ensure appropriate concentric fit.  The peripheral holes were then drilled followed by the the center hole and none of the holes   exited the glenoid wall.  The trial was placed and felt to be an appropriate fit.  I then pulse irrigated these holes and dried   them with Surgicel.  The three peripheral holes were then   pressurized cemented and the anchor peg glenoid was placed and impacted   with an excellent fit.  The glenoid was a 25 perform plus component.  The proximal humerus was then again exposed taking care not to displace the glenoid.    The entry awl was used followed by sounding reamers and then  sequentially broached from size 1 to 4. This was then left in place and the calcar planer was used. Trial head was placed with a 52x21.  With the trial implantation of the component, there was a tendency towards posterior instability.  With forward elevation, there was a tendency   towards posterior subluxation.   The trial was removed and the final implant was prepared on a back table.  The trial was removed and the final implant was prepared on a back table.   The posterior capsule was exposed by pulling the humeral head laterally.  A 0 PDS suture was used in a pursestring technique to plicate the posterior capsule to try and take out some of the redundancy and prevent posterior instability. 3 small holes were  drilled on the medial side of the lesser tuberosity osteotomy, through which 2 labral tapes were passed. The implant was then placed through the loop of the 2 labral tapes and impacted with an excellent press-fit. This achieved excellent anatomic reconstruction of the proximal humerus.  The joint was then copiously irrigated with pulse lavage.  The subscapularis and   lesser tuberosity osteotomy were then repaired using the 2 labral tapes previously passed in a double row fashion with horizontal mattress sutures medially brought over through bone tunnels tied over a bone bridge laterally.   2 #1 Ethibonds was placed at the rotator interval just above   the lesser tuberosity with an extra stitch placed more medial to try and tighten anteriorly a bit given his posterior laxity. Copious irrigation was used. Skin was closed with 2-0 Vicryl sutures in the deep dermal layer and 4-0 Monocryl in a subcuticular  running fashion.  Sterile dressings were then applied including Aquacel.  The patient was placed in a sling and allowed to awaken from general anesthesia and taken to the recovery room in stable condition.      POSTOPERATIVE PLAN:  Early passive range of motion will be allowed with the goal of 0  degrees external rotation but we will hold on any forward elevation for now given his tendency towards posterior instability.  No internal rotation at this time.  No active motion of the arm until the lesser tuberosity heals.  The patient will be observed today in the recovery room and if he is doing well could probably be discharged home later today.

## 2019-11-13 NOTE — Discharge Instructions (Signed)
Discharge Instructions after Total Shoulder Arthroplasty    A sling has been provided for you. Remove the sling 5 times each day to perform motion exercises.   Use ice on the shoulder intermittently over the first 48 hours after surgery.   Pain medication has been prescribed for you.   Use your medication liberally over the first 48 hours, and then begin to taper your use. You may take Extra Strength Tylenol or Tylenol only in place of the pain pills. DO NOT take ANY nonsteroidal anti-inflammatory pain medications: Advil, Motrin, Ibuprofen, Aleve, Naproxen, or Naprosyn.  Take one aspirin a day for 2 weeks after surgery, unless you have an aspirin sensitivity/allergy or asthma.  Leave your dressing on until your first follow up visit.  You may shower with the dressing.  Hold your arm as if you still have your sling on while you shower.  Active reaching and lifting are not permitted. You may use the operative arm for activities of daily living that do not require the operative arm to leave the side of the body, such as eating, drinking, bathing, etc.   Three to 5 times each day you should perform assisted external rotation (outward turning) exercises with the operative arm. You were taught these exercises prior to discharge.    External rotation is turning the arm out to the side while your elbow stays close to your body. External rotation is best stretched while you are lying on your back. Hold a cane, yardstick, broom handle, or dowel in both hands. Bend both elbows to a right angle. Use steady, gentle force from your normal arm to rotate the hand of the stiff shoulder out away from your body. Continue the rotation until it is straight in front of you holding it there for a count of 10. Do not go beyond this level of rotation until seen back by Dr. Tamera Punt. Repeat this exercise ten times slowly.      Please call [phone] during normal business hours or [phone] after hours for any  problems. Including the following:  - excessive redness of the incisions - drainage for more than 4 days - fever of more than 101.5 F  *Please note that pain medications will not be refilled after hours or on weekends.

## 2019-11-13 NOTE — Progress Notes (Signed)
   PATIENT ID: Marc Watson   Day of Surgery Procedure(s) (LRB): TOTAL SHOULDER ARTHROPLASTY (Right)  Subjective: The patient is doing well in the recovery room.  He feels good with a good pain block.  He is interested in going home.  Objective:  Vitals:   11/13/19 1111 11/13/19 1120  BP: 110/68 118/68  Pulse: 80 87  Resp: 15 20  Temp: 98.6 F (37 C) 98.1 F (36.7 C)  SpO2: 95%      The right shoulder dressing is clean dry and intact.  Distally he can move his hand but has a good nerve block proximally.  Labs:  Recent Labs    11/11/19 0851  HGB 14.6   Recent Labs    11/11/19 0851  WBC 10.0  RBC 4.95  HCT 45.2  PLT 292   Recent Labs    11/11/19 0851  NA 138  K 4.0  CL 106  CO2 25  BUN 16  CREATININE 0.96  GLUCOSE 105*  CALCIUM 9.2    Assessment and Plan: He has recovered well quicker than expected and I think can go home safely today rather than spending the night. We will discharge home today with family and follow-up in 2 weeks for recheck. He feels comfortable with his required exercises that he learned preoperatively.

## 2019-11-13 NOTE — Care Plan (Signed)
Contacted from hospital that patient is stable and willing for same day discharge. Contacted [employer] and they will note as observation. Outpatient code for this procedure doesn't require authorization.  Dr. Tamera Punt and PA called with update and to obtain observation order.    Ladell Heads, [place]

## 2019-11-13 NOTE — H&P (Signed)
Marc Watson is an 63 y.o. male.   Chief Complaint: R shoulder pain and dysfunction HPI: Endstage R shoulder arthritis with significant pain and dysfunction, failed conservative measures.  Pain interferes with sleep and quality of life.   Past Medical History:  Diagnosis Date  . Arthritis   . COPD (chronic obstructive pulmonary disease) ([place])    per CXR on 11/11/2019  . GERD (gastroesophageal reflux disease)    occ  . Hypertension    recently started taking on 11/05/2019    Past Surgical History:  Procedure Laterality Date  . JOINT REPLACEMENT  2018   left hip  . KNEE SURGERY Right    cartilage  . LEG SURGERY Left 1969   "stunted growth left leg"  . TOTAL HIP ARTHROPLASTY Left 03/27/2017  . TOTAL HIP ARTHROPLASTY Left 03/27/2017   Procedure: TOTAL HIP ARTHROPLASTY ANTERIOR APPROACH;  Surgeon: Melrose Nakayama, MD;  Location: [facility];  Service: Orthopedics;  Laterality: Left;    History reviewed. No pertinent family history. Social History:  reports that he has been smoking. He has a 15.00 pack-year smoking history. He has never used smokeless tobacco. He reports current alcohol use. He reports current drug use. Drug: Cocaine.  Allergies:  Allergies  Allergen Reactions  . No Known Allergies     Medications Prior to Admission  Medication Sig Dispense Refill  . cyclobenzaprine (FLEXERIL) 10 MG tablet Take 10 mg by mouth at bedtime as needed for muscle spasms.     Marland Kitchen losartan (COZAAR) 25 MG tablet Take 25 mg by mouth daily.    . naproxen (NAPROSYN) 500 MG tablet Take 500 mg by mouth 2 (two) times daily as needed for moderate pain.    . Omega-3 Fatty Acids (FISH OIL PO) Take 2 capsules by mouth daily. OMEGA XL PROPRIETARY BLEND 300 MG d-ALPHA TOCOPHEROL (VITAMIN E)/EXTRA [place]IR[place] OLIVE OIL/EXTRACT (pcso-524) CONTAINING OMEGA FATTY ACIDS, GREEN LIPPED MUSSEL (PERNA CANLICULUS) OIL)       Results for orders placed or performed during the hospital encounter of 11/11/19 (from the  past 48 hour(s))  Surgical pcr screen     Status: Abnormal   Collection Time: 11/11/19  8:18 AM   Specimen: Nasal Mucosa; Nasal Swab  Result Value Ref Range   MRSA, PCR NEGATIVE NEGATIVE   Staphylococcus aureus POSITIVE (A) NEGATIVE    Comment: (NOTE) The Xpert SA Assay (FDA approved for NASAL specimens in patients 5 years of age and older), is one component of a comprehensive surveillance program. It is not intended to diagnose infection nor to guide or monitor treatment. Performed at [facility], [place] [address], [place], [place] [address]   APTT     Status: None   Collection Time: 11/11/19  8:51 AM  Result Value Ref Range   aPTT 30 24 - 36 seconds    Comment: Performed at [facility], [place] [address] [place], [place] [address]  CBC WITH DIFFERENTIAL     Status: None   Collection Time: 11/11/19  8:51 AM  Result Value Ref Range   WBC 10.0 4.0 - 10.5 K/uL   RBC 4.95 4.22 - 5.81 MIL/uL   Hemoglobin 14.6 13.0 - 17.0 g/dL   HCT 45.2 39.0 - 52.0 %   MCV 91.3 80.0 - 100.0 fL   MCH 29.5 26.0 - 34.0 pg   MCHC 32.3 30.0 - 36.0 g/dL   RDW 13.0 11.5 - 15.5 %   Platelets 292 150 - 400 K/uL   nRBC 0.0  0.0 - 0.2 %   Neutrophils Relative % 55 %   Neutro Abs 5.5 1.7 - 7.7 K/uL   Lymphocytes Relative 32 %   Lymphs Abs 3.2 0.7 - 4.0 K/uL   Monocytes Relative 9 %   Monocytes Absolute 0.9 0.1 - 1.0 K/uL   Eosinophils Relative 2 %   Eosinophils Absolute 0.2 0.0 - 0.5 K/uL   Basophils Relative 1 %   Basophils Absolute 0.1 0.0 - 0.1 K/uL   Immature Granulocytes 1 %   Abs Immature Granulocytes 0.05 0.00 - 0.07 K/uL    Comment: Performed at [facility], [place] [address], [place], [place] [address]  Comprehensive metabolic panel     Status: Abnormal   Collection Time: 11/11/19  8:51 AM  Result Value Ref Range   Sodium 138 135 - 145 mmol/L   Potassium 4.0 3.5 - 5.1 mmol/L   Chloride 106 98 - 111 mmol/L   CO2 25 22 - 32 mmol/L    Glucose, Bld 105 (H) 70 - 99 mg/dL   BUN 16 8 - 23 mg/dL   Creatinine, Ser 0.96 0.61 - 1.24 mg/dL   Calcium 9.2 8.9 - 10.3 mg/dL   Total Protein 7.0 6.5 - 8.1 g/dL   Albumin 4.1 3.5 - 5.0 g/dL   AST 18 15 - 41 U/L   ALT 23 0 - 44 U/L   Alkaline Phosphatase 90 38 - 126 U/L   Total Bilirubin 0.6 0.3 - 1.2 mg/dL   GFR calc non Af Amer >60 >60 mL/min   GFR calc Af Amer >60 >60 mL/min   Anion gap 7 5 - 15    Comment: Performed at [facility], [place] [address], [place], [place] [address]  Protime-INR     Status: None   Collection Time: 11/11/19  8:51 AM  Result Value Ref Range   Prothrombin Time 12.5 11.4 - 15.2 seconds   INR 0.9 0.8 - 1.2    Comment: (NOTE) INR goal varies based on device and disease states. Performed at [facility], [place] [address], [place], [place] [address]   Type and screen Order type and screen if day of surgery is less than 15 days from draw of preadmission visit or order morning of surgery if day of surgery is greater than 6 days from preadmission visit.     Status: None   Collection Time: 11/11/19  8:51 AM  Result Value Ref Range   ABO/RH(D) O POS    Antibody Screen NEG    Sample Expiration 11/25/2019,2359    Extend sample reason      NO TRANSFUSIONS OR PREGNANCY IN THE PAST 3 MONTHS Performed at [facility], [place] [address] [place], [place] [address]   Urinalysis, Routine w reflex microscopic     Status: None   Collection Time: 11/11/19  9:04 AM  Result Value Ref Range   Color, Urine YELLOW YELLOW   APPearance CLEAR CLEAR   Specific Gravity, Urine 1.015 1.005 - 1.030   pH 5.0 5.0 - 8.0   Glucose, UA NEGATIVE NEGATIVE mg/dL   Hgb urine dipstick NEGATIVE NEGATIVE   Bilirubin Urine NEGATIVE NEGATIVE   Ketones, ur NEGATIVE NEGATIVE mg/dL   Protein, ur NEGATIVE NEGATIVE mg/dL   Nitrite NEGATIVE NEGATIVE   Leukocytes,Ua NEGATIVE NEGATIVE    Comment: Performed at [place] [address], [place], [place] [address]  ABO/Rh     Status: None   Collection Time: 11/11/19  9:07 AM  Result Value Ref Range   ABO/RH(D)      O POS Performed at [place] [address] [place], [place] [address]    Dg Chest 2 View  Result Date: 11/11/2019 CLINICAL DATA:  Pre-op respiratory exam for right shoulder replacement. EXAM: CHEST - 2 VIEW COMPARISON:  11/05/2019 FINDINGS: Heart size is normal. Stable ectasia of the thoracic aorta. Aortic atherosclerosis. Stable pulmonary hyperinflation, consistent with COPD. No evidence of pulmonary infiltrate or edema. No evidence of pleural effusion. IMPRESSION: COPD.  No active cardiopulmonary disease. Electronically Signed   By: Marlaine Hind M.D.   On: 11/11/2019 10:02    Review of Systems  All other systems reviewed and are negative.   Blood pressure 140/87, pulse 81, temperature 98.2 F (36.8 C), temperature source Oral, resp. rate 10, height 6\' 1"  (1.854 m), weight 111.1 kg, SpO2 96 %. Physical Exam  Constitutional: He is oriented to person, place, and time. He appears well-developed and well-nourished.  HENT:  Head: Atraumatic.  Eyes: EOM are normal.  Cardiovascular: Intact distal pulses.  Respiratory: Effort normal.  Musculoskeletal:     Comments: R shoulder pain and limited ROM. NVID  Neurological: He is alert and oriented to person, place, and time.  Skin: Skin is warm and dry.  Psychiatric: He has a normal mood and affect.     Assessment/Plan Endstage R shoulder arthritis with significant pain and dysfunction, failed conservative measures.  Pain interferes with sleep and quality of life.  Plan R TSA  Risks / benefits of surgery discussed Consent on chart  NPO for OR Preop antibiotics   Isabella Stalling, MD 11/13/2019, 7:14 AM

## 2019-11-13 NOTE — Anesthesia Postprocedure Evaluation (Signed)
Anesthesia Post Note  Patient: Marc Watson  Procedure(s) Performed: TOTAL SHOULDER ARTHROPLASTY (Right Shoulder)     Patient location during evaluation: PACU Anesthesia Type: General Level of consciousness: sedated and patient cooperative Pain management: pain level controlled Vital Signs Assessment: post-procedure vital signs reviewed and stable Respiratory status: spontaneous breathing Cardiovascular status: stable Anesthetic complications: no    Last Vitals:  Vitals:   11/13/19 1111 11/13/19 1120  BP: 110/68 118/68  Pulse: 80 87  Resp: 15 20  Temp: 37 C 36.7 C  SpO2: 95%     Last Pain:  Vitals:   11/13/19 1120  TempSrc: Oral  PainSc:                  [name]

## 2019-11-13 NOTE — Anesthesia Procedure Notes (Signed)
Anesthesia Regional Block: Interscalene brachial plexus block   Pre-Anesthetic Checklist: ,, timeout performed, Correct Patient, Correct Site, Correct Laterality, Correct Procedure, Correct Position, site marked, Risks and benefits discussed,  Surgical consent,  Pre-op evaluation,  At surgeon's request and post-op pain management  Laterality: Right  Prep: chloraprep       Needles:  Injection technique: Single-shot  Needle Type: Stimulator Needle - 40     Needle Length: 4cm  Needle Gauge: 22     Additional Needles:   Procedures:,,,, ultrasound used (permanent image in chart),,,,  Narrative:  Start time: 11/13/2019 6:55 AM End time: 11/13/2019 7:00 AM Injection made incrementally with aspirations every 5 mL. Anesthesiologist: [name], MD  Additional Notes: BP cuff, EKG monitors applied. Sedation begun. Nerve location verified with U/S. Anesthetic injected incrementally, slowly , and after neg aspirations under direct u/s guidance. Good perineural spread. Tolerated well.

## 2019-11-13 NOTE — Anesthesia Procedure Notes (Signed)
Procedure Name: Intubation Date/Time: 11/13/2019 7:33 AM Performed by: Gerald Leitz, CRNA Pre-anesthesia Checklist: Patient identified, Patient being monitored, Timeout performed, Emergency Drugs available and Suction available Patient Re-evaluated:Patient Re-evaluated prior to induction Oxygen Delivery Method: Circle system utilized Preoxygenation: Pre-oxygenation with 100% oxygen Induction Type: IV induction Ventilation: Mask ventilation without difficulty Laryngoscope Size: Mac and 3 Grade View: Grade II Tube type: Oral Tube size: 7.5 mm Number of attempts: 1 Placement Confirmation: ETT inserted through vocal cords under direct vision,  positive ETCO2 and breath sounds checked- equal and bilateral Secured at: 21 cm Tube secured with: Tape Dental Injury: Teeth and Oropharynx as per pre-operative assessment

## 2019-11-14 ENCOUNTER — Other Ambulatory Visit: Payer: BC Managed Care – PPO

## 2019-11-17 ENCOUNTER — Encounter (HOSPITAL_COMMUNITY): Payer: Self-pay | Admitting: Orthopedic Surgery

## 2019-11-18 ENCOUNTER — Other Ambulatory Visit: Payer: Self-pay | Admitting: Internal Medicine

## 2019-11-18 DIAGNOSIS — R590 Localized enlarged lymph nodes: Secondary | ICD-10-CM

## 2019-11-26 DIAGNOSIS — Z9889 Other specified postprocedural states: Secondary | ICD-10-CM | POA: Diagnosis not present

## 2019-12-10 ENCOUNTER — Other Ambulatory Visit: Payer: Self-pay

## 2019-12-10 ENCOUNTER — Other Ambulatory Visit: Payer: Self-pay | Admitting: Internal Medicine

## 2019-12-10 ENCOUNTER — Ambulatory Visit
Admission: RE | Admit: 2019-12-10 | Discharge: 2019-12-10 | Disposition: A | Discharge status: Home / Self Care | Payer: BC Managed Care – PPO | Source: Ambulatory Visit | Attending: Internal Medicine | Admitting: Internal Medicine

## 2019-12-10 DIAGNOSIS — R590 Localized enlarged lymph nodes: Secondary | ICD-10-CM

## 2019-12-10 DIAGNOSIS — R928 Other abnormal and inconclusive findings on diagnostic imaging of breast: Secondary | ICD-10-CM | POA: Diagnosis not present

## 2019-12-12 DIAGNOSIS — C801 Malignant (primary) neoplasm, unspecified: Secondary | ICD-10-CM

## 2019-12-12 HISTORY — DX: Malignant (primary) neoplasm, unspecified: C80.1

## 2019-12-15 ENCOUNTER — Ambulatory Visit
Admission: RE | Admit: 2019-12-15 | Discharge: 2019-12-15 | Disposition: A | Discharge status: Home / Self Care | Payer: BC Managed Care – PPO | Source: Ambulatory Visit | Attending: Internal Medicine | Admitting: Internal Medicine

## 2019-12-15 ENCOUNTER — Other Ambulatory Visit: Payer: Self-pay

## 2019-12-15 DIAGNOSIS — C8174 Other classical Hodgkin lymphoma, lymph nodes of axilla and upper limb: Secondary | ICD-10-CM | POA: Diagnosis not present

## 2019-12-15 DIAGNOSIS — R590 Localized enlarged lymph nodes: Secondary | ICD-10-CM | POA: Diagnosis not present

## 2019-12-22 NOTE — Discharge Summary (Signed)
Patient ID: Marc Watson MRN: [id number] DOB/AGE: March 24, 1956 64 y.o.  Admit date: 11/13/2019 Discharge date: 11/14/2019  Admission Diagnoses:  Active Problems:   Status post total shoulder arthroplasty, right   S/P shoulder replacement, right   Discharge Diagnoses:  Same  Past Medical History:  Diagnosis Date  . Arthritis   . COPD (chronic obstructive pulmonary disease) ([place])    per CXR on 11/11/2019  . GERD (gastroesophageal reflux disease)    occ  . Hypertension    recently started taking on 11/05/2019    Surgeries: Procedure(s): TOTAL SHOULDER ARTHROPLASTY on 11/13/2019   Consultants:   Discharged Condition: Improved  Hospital Course: JANI PLOEGER is an 64 y.o. male who was admitted 11/13/2019 for operative treatment of right shoulder OA. Patient has severe unremitting pain that affects sleep, daily activities, and work/hobbies. After pre-op clearance the patient was taken to the operating room on 11/13/2019 and underwent  Procedure(s): TOTAL SHOULDER ARTHROPLASTY.    Patient was given perioperative antibiotics:  Anti-infectives (From admission, onward)   Start     Dose/Rate Route Frequency Ordered Stop   11/13/19 0600  ceFAZolin (ANCEF) IVPB 2g/100 mL premix     2 g 200 mL/hr over 30 Minutes Intravenous On call to O.R. 11/13/19 0533 11/13/19 0727   11/13/19 0535  ceFAZolin (ANCEF) 2-4 GM/100ML-% IVPB    Note to Pharmacy: Randa Evens  : cabinet override      11/13/19 0535 11/13/19 0727       Patient was given sequential compression devices, early ambulation, and asa to prevent DVT.  Patient benefited maximally from hospital stay and there were no complications.    Recent vital signs: No data found.   Recent laboratory studies: No results for input(s): WBC, HGB, HCT, PLT, NA, K, CL, CO2, BUN, CREATININE, GLUCOSE, INR, CALCIUM in the last 72 hours.  Invalid input(s): PT, 2   Discharge Medications:   Allergies as of 11/13/2019      Reactions   No  Known Allergies       Medication List    STOP taking these medications   naproxen 500 MG tablet Commonly known as: NAPROSYN     TAKE these medications   cyclobenzaprine 10 MG tablet Commonly known as: FLEXERIL Take 10 mg by mouth at bedtime as needed for muscle spasms.   FISH OIL PO Take 2 capsules by mouth daily. OMEGA XL PROPRIETARY BLEND 300 MG d-ALPHA TOCOPHEROL (VITAMIN E)/EXTRA VIRGIN OLIVE OIL/EXTRACT (pcso-524) CONTAINING OMEGA FATTY ACIDS, GREEN LIPPED MUSSEL (PERNA CANLICULUS) OIL)   losartan 25 MG tablet Commonly known as: COZAAR Take 25 mg by mouth daily.   oxyCODONE-acetaminophen 5-325 MG tablet Commonly known as: Percocet Take 1-2 tablets every 4 hours as needed for post operative pain. MAX 6/day       Diagnostic Studies: MM DIAG BREAST TOMO BILATERAL  Result Date: 12/10/2019 CLINICAL DATA:  Patient presents for enlarged right axillary lymph node seen on prior CT and MR in October 2020. Patient had severe right shoulder OA and is now status post right shoulder arthroplasty. EXAM: DIGITAL DIAGNOSTIC BILATERAL MAMMOGRAM WITH CAD AND TOMO COMPARISON:  Previous exam(s). ACR Breast Density Category a: The breast tissue is almost entirely fatty. FINDINGS: Mammogram: No suspicious mass, distortion, or microcalcifications are identified to suggest presence of malignancy in the bilateral breasts. There are asymmetrically enlarged right axillary lymph nodes, measuring up to 1.9 cm. Mammographic images were processed with CAD. Ultrasound: Targeted ultrasound of the right axilla demonstrates several enlarged lymph nodes, the  largest which is rounded in shape and without fatty hilum measuring 2.0 x 1.9 x 2.0 cm. IMPRESSION: Right axillary adenopathy is indeterminate, possibly related to the patient's right shoulder surgery. However tissue sampling is recommended to exclude malignancy. RECOMMENDATION: Ultrasound-guided core needle biopsy of the largest right axillary lymph node. This  will be scheduled at the patient's convenience. I have discussed the findings and recommendations with the patient. If applicable, a reminder letter will be sent to the patient regarding the next appointment. BI-RADS CATEGORY  4: Suspicious. Electronically Signed   By: Audie Pinto M.D.   On: 12/10/2019 08:32   [place] AXILLARY NODE CORE BIOPSY RIGHT  Result Date: 12/15/2019 CLINICAL DATA:  Enlarged right axillary adenopathy. EXAM: [place] AXILLARY NODE CORE BIOPSY RIGHT COMPARISON:  Previous exam(s). FINDINGS: I met with the patient and we discussed the procedure of ultrasound-guided biopsy, including benefits and alternatives. We discussed the high likelihood of a successful procedure. We discussed the risks of the procedure, including infection, bleeding, tissue injury, clip migration, and inadequate sampling. Informed written consent was given. The usual time-out protocol was performed immediately prior to the procedure. Using sterile technique and 1% lidocaine and 1% lidocaine with epinephrine as local anesthetic, under direct ultrasound visualization, a 14 gauge spring-loaded device was used to perform biopsy of a right axillary lymph node using a lateral to medial approach. At the conclusion of the procedure [facility] tissue marker clip was deployed into the biopsy cavity. IMPRESSION: Ultrasound guided biopsy of the right axilla. No apparent complications. Electronically Signed   By: Lillia Mountain M.D.   On: 12/15/2019 09:10   [place] AXILLA RIGHT  Result Date: 12/10/2019 CLINICAL DATA:  Patient presents for enlarged right axillary lymph node seen on prior CT and MR in October 2020. Patient had severe right shoulder OA and is now status post right shoulder arthroplasty. EXAM: DIGITAL DIAGNOSTIC BILATERAL MAMMOGRAM WITH CAD AND TOMO COMPARISON:  Previous exam(s). ACR Breast Density Category a: The breast tissue is almost entirely fatty. FINDINGS: Mammogram: No suspicious mass, distortion, or microcalcifications  are identified to suggest presence of malignancy in the bilateral breasts. There are asymmetrically enlarged right axillary lymph nodes, measuring up to 1.9 cm. Mammographic images were processed with CAD. Ultrasound: Targeted ultrasound of the right axilla demonstrates several enlarged lymph nodes, the largest which is rounded in shape and without fatty hilum measuring 2.0 x 1.9 x 2.0 cm. IMPRESSION: Right axillary adenopathy is indeterminate, possibly related to the patient's right shoulder surgery. However tissue sampling is recommended to exclude malignancy. RECOMMENDATION: Ultrasound-guided core needle biopsy of the largest right axillary lymph node. This will be scheduled at the patient's convenience. I have discussed the findings and recommendations with the patient. If applicable, a reminder letter will be sent to the patient regarding the next appointment. BI-RADS CATEGORY  4: Suspicious. Electronically Signed   By: Audie Pinto M.D.   On: 12/10/2019 08:32    Disposition:   Discharge Instructions    Call MD / Call 911   Complete by: As directed    If you experience chest pain or shortness of breath, CALL 911 and be transported to the hospital emergency room.  If you develope a fever above 101 F, pus (white drainage) or increased drainage or redness at the wound, or calf pain, call your surgeon's office.   Constipation Prevention   Complete by: As directed    Drink plenty of fluids.  Prune juice may be helpful.  You may use a stool softener, such  as Colace (over the counter) 100 mg twice a day.  Use MiraLax (over the counter) for constipation as needed.   Diet - low sodium heart healthy   Complete by: As directed    Increase activity slowly as tolerated   Complete by: As directed       Follow-up Information    Tania Ade, MD. Schedule an appointment as soon as possible for a visit in 2 weeks.   Specialty: Orthopedic Surgery Contact information: [address]  [place] [phone]            Signed: Grier Mitts 12/22/2019, 3:42 PM

## 2019-12-23 ENCOUNTER — Telehealth: Payer: Self-pay | Admitting: Hematology and Oncology

## 2019-12-23 NOTE — Telephone Encounter (Signed)
Received a new pt referral from Dr. [name] for dx of lymphoma. A new patient appt has been scheduled for Mr. Marc Watson to see Dr. Lorenso Courier on 1/12 at [place]. Marc Watson returned my call and has been made aware to arrive 15 minutes early.

## 2019-12-23 NOTE — Progress Notes (Signed)
[place] [phone]   Fax:([phone]) [place] NOTE  Patient Care Team: [name], MD as PCP - General (Internal Medicine)  Hematological/Oncological History # Classic Hodgkin's Lymphoma, staging underway 1)10/10/2019: CT scan for right shoulder pain showed increased right axillary lymphadenopathy.  2) 12/10/2019: patient underwent US of the right axilla, noted to have right axillary lymphadenopathy 3) 12/15/2019: [place] axillary lymph node biopsy. Pathology confirms Classical Hodgkin's lymphoma 4) 12/24/2019: Establish care with Dr. Lorenso Courier  CHIEF COMPLAINTS/PURPOSE OF CONSULTATION:  Classic Hodgkin's Lymphoma  HISTORY OF PRESENTING ILLNESS:  Marc Watson 64 y.o. male with medical history significant for COPD, GERD, and HTN who presents for evaluation of newly diagnosed classical Hodgkin's lymphoma.  On review of the previous records Marc Watson underwent CT scanning of his right shoulder on 10/10/2019 due to marked pain in his right shoulder.  The CT scan showed severe right glenohumeral joint degenerative changes as well as right lower axillary lymph nodes.  Patient underwent arthroscopic surgery on 11/13/2019 and was discharged home in good condition.  When the lymphadenopathy failed to resolve the patient underwent a ultrasound on 12/10/2019 which revealed right axillary lymphadenopathy, the largest lymph node measuring 2.0 x 1.9 x 2.0 cm.  On 12/15/2019 the patient underwent an ultrasound-guided lymph node biopsy which confirmed the diagnosis of classical Hodgkin's lymphoma.  The patient was then referred to oncology for further evaluation and management.  On exam today Marc Watson notes that he feels fatigue still from his surgery.  He reports that he has not been sleeping entirely throughout the night has been taking small naps throughout the day.  He reports that he does not have any fevers, chills, sweats, or weight loss.  He is actually  increased in weight since the time of his surgery.  His appetite has been good and he denies having any issues with bruising, bleeding, nausea, vomiting, or diarrhea.  He notes that he has not been able to feel any focal lymphadenopathy in his neck under his arms or in his groin area.  He is currently an active smoker reportedly smoking 1/2 pack/day which is down from his baseline of 1 pack/day which he was smoking prior to the surgery.  He notes that he is having no other focal symptoms.  A full 10 point ROS is listed below  MEDICAL HISTORY:  Past Medical History:  Diagnosis Date  . Arthritis   . COPD (chronic obstructive pulmonary disease) ([place])    per CXR on 11/11/2019  . GERD (gastroesophageal reflux disease)    occ  . Hypertension    recently started taking on 11/05/2019    SURGICAL HISTORY: Past Surgical History:  Procedure Laterality Date  . JOINT REPLACEMENT  2018   left hip  . KNEE SURGERY Right    cartilage  . LEG SURGERY Left 1969   "stunted growth left leg"  . TOTAL HIP ARTHROPLASTY Left 03/27/2017  . TOTAL HIP ARTHROPLASTY Left 03/27/2017   Procedure: TOTAL HIP ARTHROPLASTY ANTERIOR APPROACH;  Surgeon: Melrose Nakayama, MD;  Location: [facility];  Service: Orthopedics;  Laterality: Left;  . TOTAL SHOULDER ARTHROPLASTY Right 11/13/2019   Procedure: TOTAL SHOULDER ARTHROPLASTY;  Surgeon: Tania Ade, MD;  Location: [facility];  Service: Orthopedics;  Laterality: Right;    SOCIAL HISTORY: Social History   Socioeconomic History  . Marital status: Married    Spouse name: Not on file  . Number of children: Not on file  . Years of education: Not on file  .  Highest education level: Not on file  Occupational History  . Not on file  Tobacco Use  . Smoking status: Current Every Day Smoker    Packs/day: 0.50    Years: 30.00    Pack years: 15.00  . Smokeless tobacco: Never Used  Substance and Sexual Activity  . Alcohol use: Yes    Comment: occassionally  . Drug use: Yes     Types: Cocaine    Comment: last used 02/2017- none since  . Sexual activity: Not on file  Other Topics Concern  . Not on file  Social History Narrative  . Not on file   Social Determinants of Health   Financial Resource Strain:   . Difficulty of Paying Living Expenses: Not on file  Food Insecurity:   . Worried About Charity fundraiser in the Last Year: Not on file  . Ran Out of Food in the Last Year: Not on file  Transportation Needs:   . Lack of Transportation (Medical): Not on file  . Lack of Transportation (Non-Medical): Not on file  Physical Activity:   . Days of Exercise per Week: Not on file  . Minutes of Exercise per Session: Not on file  Stress:   . Feeling of Stress : Not on file  Social Connections:   . Frequency of Communication with Friends and Family: Not on file  . Frequency of Social Gatherings with Friends and Family: Not on file  . Attends Religious Services: Not on file  . Active Member of Clubs or Organizations: Not on file  . Attends Archivist Meetings: Not on file  . Marital Status: Not on file  Intimate Partner Violence:   . Fear of Current or Ex-Partner: Not on file  . Emotionally Abused: Not on file  . Physically Abused: Not on file  . Sexually Abused: Not on file    FAMILY HISTORY: No family history on file.  ALLERGIES:  is allergic to no known allergies.  MEDICATIONS:  Current Outpatient Medications  Medication Sig Dispense Refill  . naproxen (NAPROSYN) 250 MG tablet Take by mouth 2 (two) times daily with a meal. As needed    . cyclobenzaprine (FLEXERIL) 10 MG tablet Take 10 mg by mouth at bedtime as needed for muscle spasms.     Marland Kitchen losartan (COZAAR) 25 MG tablet Take 25 mg by mouth daily.    . Omega-3 Fatty Acids (FISH OIL PO) Take 2 capsules by mouth daily. OMEGA XL PROPRIETARY BLEND 300 MG d-ALPHA TOCOPHEROL (VITAMIN E)/EXTRA VIRGIN OLIVE OIL/EXTRACT (pcso-524) CONTAINING OMEGA FATTY ACIDS, GREEN LIPPED MUSSEL (PERNA CANLICULUS)  OIL)     . oxyCODONE-acetaminophen (PERCOCET) 5-325 MG tablet Take 1-2 tablets every 4 hours as needed for post operative pain. MAX 6/day (Patient not taking: Reported on 12/24/2019) 30 tablet 0   No current facility-administered medications for this visit.    REVIEW OF SYSTEMS:   Constitutional: ( - ) fevers, ( - )  chills , ( - ) night sweats Eyes: ( - ) blurriness of vision, ( - ) double vision, ( - ) watery eyes Ears, nose, mouth, throat, and face: ( - ) mucositis, ( - ) sore throat Respiratory: ( - ) cough, ( - ) dyspnea, ( - ) wheezes Cardiovascular: ( - ) palpitation, ( - ) chest discomfort, ( - ) lower extremity swelling Gastrointestinal:  ( - ) nausea, ( - ) heartburn, ( - ) change in bowel habits Skin: ( - ) abnormal skin rashes Lymphatics: ( - )  new lymphadenopathy, ( - ) easy bruising Neurological: ( - ) numbness, ( - ) tingling, ( - ) new weaknesses Behavioral/Psych: ( - ) mood change, ( - ) new changes  All other systems were reviewed with the patient and are negative.  PHYSICAL EXAMINATION: ECOG PERFORMANCE STATUS: 0 - Asymptomatic  Vitals:   12/24/19 1105  BP: (!) 158/91  Pulse: 88  Resp: (!) 188  Temp: 97.8 F (36.6 C)  SpO2: 97%   Filed Weights   12/24/19 1105  Weight: 254 lb 11.2 oz (115.5 kg)    GENERAL: well appearing middle aged Caucasian male in NAD  SKIN: skin color, texture, turgor are normal, no rashes or significant lesions EYES: conjunctiva are pink and non-injected, sclera clear NECK: supple, non-tender LYMPH:  no palpable lymphadenopathy in the cervical, axillary or supraclavicular regions LUNGS: clear to auscultation and percussion with normal breathing effort HEART: regular rate & rhythm and no murmurs and no lower extremity edema ABDOMEN: soft, non-tender, non-distended, normal bowel sounds Musculoskeletal: no cyanosis of digits and no clubbing  PSYCH: alert & oriented x 3, fluent speech NEURO: no focal motor/sensory deficits  LABORATORY  DATA:  I have reviewed the data as listed Recent Results (from the past 2160 hour(s))  Novel Coronavirus, NAA (Hosp order, Send-out to [facility]; TAT 18-24 hrs     Status: None   Collection Time: 11/10/19  8:32 AM   Specimen: Nasopharyngeal Swab; Respiratory  Result Value Ref Range   SARS-CoV-2, NAA NOT DETECTED NOT DETECTED    Comment: (NOTE) This nucleic acid amplification test was developed and its performance characteristics determined by [employer]. Nucleic acid amplification tests include PCR and TMA. This test has not been FDA cleared or approved. This test has been authorized by FDA under an Emergency Use Authorization (EUA). This test is only authorized for the duration of time the declaration that circumstances exist justifying the authorization of the emergency use of in vitro diagnostic tests for detection of SARS-CoV-2 virus and/or diagnosis of COVID-19 infection under section 564(b)(1) of the Act, 21 U.S.C. 762GBT-5(V) (1), unless the authorization is terminated or revoked sooner. When diagnostic testing is negative, the possibility of a false negative result should be considered in the context of a patient's recent exposures and the presence of clinical signs and symptoms consistent with COVID-19. An individual without symptoms of COVID- 19 and who is not shedding SARS-CoV-2 vi rus would expect to have a negative (not detected) result in this assay. Performed At: [facility] [place], [place] [id number] Rush Farmer MD GG:[id number]    Coronavirus Source NASOPHARYNGEAL     Comment: Performed at [facility], [place] [address], [place], [place] [address]  Surgical pcr screen     Status: Abnormal   Collection Time: 11/11/19  8:18 AM   Specimen: Nasal Mucosa; Nasal Swab  Result Value Ref Range   MRSA, PCR NEGATIVE NEGATIVE   Staphylococcus aureus POSITIVE (A) NEGATIVE    Comment: (NOTE) The Xpert SA Assay (FDA approved for NASAL  specimens in patients 28 years of age and older), is one component of a comprehensive surveillance program. It is not intended to diagnose infection nor to guide or monitor treatment. Performed at [facility], [place] [address], [place], [place] [address]   APTT     Status: None   Collection Time: 11/11/19  8:51 AM  Result Value Ref Range   aPTT 30 24 - 36 seconds    Comment: Performed  at [facility], [place] [address] [place], [place] [address]  CBC WITH DIFFERENTIAL     Status: None   Collection Time: 11/11/19  8:51 AM  Result Value Ref Range   WBC 10.0 4.0 - 10.5 K/uL   RBC 4.95 4.22 - 5.81 MIL/uL   Hemoglobin 14.6 13.0 - 17.0 g/dL   HCT 45.2 39.0 - 52.0 %   MCV 91.3 80.0 - 100.0 fL   MCH 29.5 26.0 - 34.0 pg   MCHC 32.3 30.0 - 36.0 g/dL   RDW 13.0 11.5 - 15.5 %   Platelets 292 150 - 400 K/uL   nRBC 0.0 0.0 - 0.2 %   Neutrophils Relative % 55 %   Neutro Abs 5.5 1.7 - 7.7 K/uL   Lymphocytes Relative 32 %   Lymphs Abs 3.2 0.7 - 4.0 K/uL   Monocytes Relative 9 %   Monocytes Absolute 0.9 0.1 - 1.0 K/uL   Eosinophils Relative 2 %   Eosinophils Absolute 0.2 0.0 - 0.5 K/uL   Basophils Relative 1 %   Basophils Absolute 0.1 0.0 - 0.1 K/uL   Immature Granulocytes 1 %   Abs Immature Granulocytes 0.05 0.00 - 0.07 K/uL    Comment: Performed at [facility], [place] [address] [place], [place] [address]  Comprehensive metabolic panel     Status: Abnormal   Collection Time: 11/11/19  8:51 AM  Result Value Ref Range   Sodium 138 135 - 145 mmol/L   Potassium 4.0 3.5 - 5.1 mmol/L   Chloride 106 98 - 111 mmol/L   CO2 25 22 - 32 mmol/L   Glucose, Bld 105 (H) 70 - 99 mg/dL   BUN 16 8 - 23 mg/dL   Creatinine, Ser 0.96 0.61 - 1.24 mg/dL   Calcium 9.2 8.9 - 10.3 mg/dL   Total Protein 7.0 6.5 - 8.1 g/dL   Albumin 4.1 3.5 - 5.0 g/dL   AST 18 15 - 41 U/L   ALT 23 0 - 44 U/L   Alkaline Phosphatase 90 38 - 126 U/L   Total Bilirubin 0.6 0.3  - 1.2 mg/dL   GFR calc non Af Amer >60 >60 mL/min   GFR calc Af Amer >60 >60 mL/min   Anion gap 7 5 - 15    Comment: Performed at [facility], [place] [address] [place], [place] [address]  Protime-INR     Status: None   Collection Time: 11/11/19  8:51 AM  Result Value Ref Range   Prothrombin Time 12.5 11.4 - 15.2 seconds   INR 0.9 0.8 - 1.2    Comment: (NOTE) INR goal varies based on device and disease states. Performed at [facility], [place] [address] [place], [place] [address]   Type and screen Order type and screen if day of surgery is less than 15 days from draw of preadmission visit or order morning of surgery if day of surgery is greater than 6 days from preadmission visit.     Status: None   Collection Time: 11/11/19  8:51 AM  Result Value Ref Range   ABO/RH(D) O POS    Antibody Screen NEG    Sample Expiration 11/16/2019,2359    Extend sample reason      NO TRANSFUSIONS OR PREGNANCY IN THE PAST 3 MONTHS Performed at [facility], [place] [address], [place], [place] [address]   Urinalysis, Routine w reflex microscopic     Status: None   Collection Time:  11/11/19  9:04 AM  Result Value Ref Range   Color, Urine YELLOW YELLOW   APPearance CLEAR CLEAR   Specific Gravity, Urine 1.015 1.005 - 1.030   pH 5.0 5.0 - 8.0   Glucose, UA NEGATIVE NEGATIVE mg/dL   Hgb urine dipstick NEGATIVE NEGATIVE   Bilirubin Urine NEGATIVE NEGATIVE   Ketones, ur NEGATIVE NEGATIVE mg/dL   Protein, ur NEGATIVE NEGATIVE mg/dL   Nitrite NEGATIVE NEGATIVE   Leukocytes,Ua NEGATIVE NEGATIVE    Comment: Performed at [place] [address] [place], [place] [address]  ABO/Rh     Status: None   Collection Time: 11/11/19  9:07 AM  Result Value Ref Range   ABO/RH(D)      Jenetta Downer POS Performed at [facility], [place] [address] [place], [place] [address]   CBC with Differential ([place] Only)     Status:  Abnormal   Collection Time: 12/24/19 12:14 PM  Result Value Ref Range   WBC Count 11.2 (H) 4.0 - 10.5 K/uL   RBC 4.78 4.22 - 5.81 MIL/uL   Hemoglobin 13.8 13.0 - 17.0 g/dL   HCT 42.5 39.0 - 52.0 %   MCV 88.9 80.0 - 100.0 fL   MCH 28.9 26.0 - 34.0 pg   MCHC 32.5 30.0 - 36.0 g/dL   RDW 13.6 11.5 - 15.5 %   Platelet Count 371 150 - 400 K/uL   nRBC 0.0 0.0 - 0.2 %   Neutrophils Relative % 61 %   Neutro Abs 6.7 1.7 - 7.7 K/uL   Lymphocytes Relative 27 %   Lymphs Abs 3.1 0.7 - 4.0 K/uL   Monocytes Relative 9 %   Monocytes Absolute 1.1 (H) 0.1 - 1.0 K/uL   Eosinophils Relative 2 %   Eosinophils Absolute 0.2 0.0 - 0.5 K/uL   Basophils Relative 1 %   Basophils Absolute 0.1 0.0 - 0.1 K/uL   Immature Granulocytes 0 %   Abs Immature Granulocytes 0.04 0.00 - 0.07 K/uL    Comment: Performed at [facility], [address] [place], [place] [address]  CMP ([place] only)     Status: Abnormal   Collection Time: 12/24/19 12:14 PM  Result Value Ref Range   Sodium 144 135 - 145 mmol/L   Potassium 4.1 3.5 - 5.1 mmol/L   Chloride 111 98 - 111 mmol/L   CO2 22 22 - 32 mmol/L   Glucose, Bld 108 (H) 70 - 99 mg/dL   BUN 22 8 - 23 mg/dL   Creatinine 1.04 0.61 - 1.24 mg/dL   Calcium 9.0 8.9 - 10.3 mg/dL   Total Protein 7.1 6.5 - 8.1 g/dL   Albumin 3.9 3.5 - 5.0 g/dL   AST 14 (L) 15 - 41 U/L   ALT 15 0 - 44 U/L   Alkaline Phosphatase 148 (H) 38 - 126 U/L   Total Bilirubin <0.2 (L) 0.3 - 1.2 mg/dL   GFR, Est Non Af Am >60 >60 mL/min   GFR, Est AFR Am >60 >60 mL/min   Anion gap 11 5 - 15    Comment: Performed at [facility], [place] [address] [place], [place] [address]  Lactate dehydrogenase (LDH)     Status: None   Collection Time: 12/24/19 12:14 PM  Result Value Ref Range   LDH 152 98 - 192 U/L    Comment: Performed at [facility], [place] [address] [place], [place] [address]  Sedimentation  rate     Status: None    Collection Time: 12/24/19 12:14 PM  Result Value Ref Range   Sed Rate 10 0 - 16 mm/hr    Comment: Performed at [facility], [place] [address], [place], [place] [address]  Save Smear [facility])     Status: None   Collection Time: 12/24/19 12:14 PM  Result Value Ref Range   Smear Review SMEAR STAINED AND AVAILABLE FOR REVIEW     Comment: Performed at [facility], [address] [place], [place] [address]    PATHOLOGY: Diagnosis Lymph node, needle/core biopsy, right axilla - CLASSICAL HODGKIN LYMPHOMA, SEE COMMENT. Microscopic Comment Core biopsies reveal an atypical lymphoreticular proliferation with clusters of large lymphoid cells. The cells have biand multi-nucleation with prominent nucleoli consistent with Hodgkin Jaclynn Guarneri (HRS) cells. There are mononuclear and lacunar variants, as well as, mummified cells. Background cells consist of histiocytes, eosinophils, and small lymphocytes. Distinct bands of fibrosis are not seen on the core biopsy. Immunohistochemistry highlights the HRS cells (CD30, CD15, PAX-5 characteristically weak). CD3 reveals T-cells and CD20 reveals B-cells. EBV in situ hybridization is negative. The overall findings are consistent with classical Hodgkin lymphoma. As the sample is a core biopsy subclassification is difficult and while dense fibrotic bands are not seen the features still favor nodular sclerosis subtype. The case was called to [facility] on 12/17/2019.  Vicente Males MD Pathologist, Electronic Signature (Case signed 12/17/2019)  RADIOGRAPHIC STUDIES: I have personally reviewed the radiological images as listed and agreed with the findings in the report: right axillary lymphadenopathy visible on CT scan of the right shoulder.   MM DIAG BREAST TOMO BILATERAL  Result Date: 12/10/2019 CLINICAL DATA:  Patient presents for enlarged right axillary lymph node seen on prior CT and MR in October  2020. Patient had severe right shoulder OA and is now status post right shoulder arthroplasty. EXAM: DIGITAL DIAGNOSTIC BILATERAL MAMMOGRAM WITH CAD AND TOMO COMPARISON:  Previous exam(s). ACR Breast Density Category a: The breast tissue is almost entirely fatty. FINDINGS: Mammogram: No suspicious mass, distortion, or microcalcifications are identified to suggest presence of malignancy in the bilateral breasts. There are asymmetrically enlarged right axillary lymph nodes, measuring up to 1.9 cm. Mammographic images were processed with CAD. Ultrasound: Targeted ultrasound of the right axilla demonstrates several enlarged lymph nodes, the largest which is rounded in shape and without fatty hilum measuring 2.0 x 1.9 x 2.0 cm. IMPRESSION: Right axillary adenopathy is indeterminate, possibly related to the patient's right shoulder surgery. However tissue sampling is recommended to exclude malignancy. RECOMMENDATION: Ultrasound-guided core needle biopsy of the largest right axillary lymph node. This will be scheduled at the patient's convenience. I have discussed the findings and recommendations with the patient. If applicable, a reminder letter will be sent to the patient regarding the next appointment. BI-RADS CATEGORY  4: Suspicious. Electronically Signed   By: Audie Pinto M.D.   On: 12/10/2019 08:32   [place] AXILLARY NODE CORE BIOPSY RIGHT  Result Date: 12/15/2019 CLINICAL DATA:  Enlarged right axillary adenopathy. EXAM: [place] AXILLARY NODE CORE BIOPSY RIGHT COMPARISON:  Previous exam(s). FINDINGS: I met with the patient and we discussed the procedure of ultrasound-guided biopsy, including benefits and alternatives. We discussed the high likelihood of a successful procedure. We discussed the risks of the procedure, including infection, bleeding, tissue injury, clip migration, and inadequate sampling. Informed written consent was given. The usual time-out protocol was performed immediately prior to the procedure.  Using sterile technique and 1% lidocaine and 1% lidocaine with epinephrine as local anesthetic, under direct ultrasound visualization, a 14 gauge spring-loaded device was used to perform biopsy of a right axillary lymph node using a lateral to medial approach. At the conclusion of the procedure [facility] tissue marker clip was deployed into the biopsy cavity. IMPRESSION: Ultrasound guided biopsy of the right axilla. No apparent complications. Electronically Signed   By: Lillia Mountain M.D.   On: 12/15/2019 09:10   [place] AXILLA RIGHT  Result Date: 12/10/2019 CLINICAL DATA:  Patient presents for enlarged right axillary lymph node seen on prior CT and MR in October 2020. Patient had severe right shoulder OA and is now status post right shoulder arthroplasty. EXAM: DIGITAL DIAGNOSTIC BILATERAL MAMMOGRAM WITH CAD AND TOMO COMPARISON:  Previous exam(s). ACR Breast Density Category a: The breast tissue is almost entirely fatty. FINDINGS: Mammogram: No suspicious mass, distortion, or microcalcifications are identified to suggest presence of malignancy in the bilateral breasts. There are asymmetrically enlarged right axillary lymph nodes, measuring up to 1.9 cm. Mammographic images were processed with CAD. Ultrasound: Targeted ultrasound of the right axilla demonstrates several enlarged lymph nodes, the largest which is rounded in shape and without fatty hilum measuring 2.0 x 1.9 x 2.0 cm. IMPRESSION: Right axillary adenopathy is indeterminate, possibly related to the patient's right shoulder surgery. However tissue sampling is recommended to exclude malignancy. RECOMMENDATION: Ultrasound-guided core needle biopsy of the largest right axillary lymph node. This will be scheduled at the patient's convenience. I have discussed the findings and recommendations with the patient. If applicable, a reminder letter will be sent to the patient regarding the next appointment. BI-RADS CATEGORY  4: Suspicious. Electronically Signed   By:  Audie Pinto M.D.   On: 12/10/2019 08:32    ASSESSMENT & PLAN Marc Watson 64 y.o. male with medical history significant for COPD, GERD, and HTN who presents for evaluation of newly diagnosed classical Hodgkin's lymphoma.  After review of the previous labs and pathology findings I agree that this is most consistent with a nodular sclerosing classical Hodgkin's lymphoma.  At this time we are only aware of lymphadenopathy in the right axilla, however full staging has yet to be performed.  In order to move forward we recommend a PET CT scan in order to complete the staging and determine the best course of action moving forward.  In order to save time we will also move forward with preparations for the chemotherapy.  The classical regimen for Hodgkin's lymphoma is ABVD, however given this patient's advanced age would be reasonable to drop the bleomycin.  Additionally he does have an apparent COPD reported on chest x-ray and strong smoking history.  Before we proceed I would recommend the patient undergo PFTs, electrocardiogram, and repeat baseline labs.  This will help [place] determine if he requires any dose modifications or changes in the treatment plan.  Undoubtedly the patient will also require frequent blood draws and therefore needs to be scheduled for a port as well.  Once we have determined the regiment we will also have the patient return for chemotherapy education.  Given the patient's age and concern for lung function it would be reasonable for this patient to undergo therapy with AVD, dropping the bleomycin as NCCN recommends it should be held or at most not be used beyond 2 cycles in individuals older than 64 years of age.   # Classic Hodgkin's Lymphoma, staging underway --today will collect baseline CBC, CMP, ESR, and LDH --  for full staging the patient will require a PET CT scan  --findings concerning for COPD on CXR. Will need to perform PFTs prior to consideration of therapy. Most HL  treatments contain bleomycin, which cause respiratory damage.  --recommend port placement in preparation for chemotherapy treatment.  --patient may require treatment with radiation therapy as well, depending on the results of the staging. Will consult rad/onc if findings appear to require ISRT.  --RTC in 2-3 weeks once the above workup has been completed.   Orders Placed This Encounter  Procedures  . NM PET Image Initial (PI) Skull Base To Thigh    Standing Status:   Future    Standing Expiration Date:   12/23/2020    Order Specific Question:   If indicated for the ordered procedure, I authorize the administration of a radiopharmaceutical per Radiology protocol    Answer:   Yes    Order Specific Question:   Preferred imaging location?    Answer:   [facility]    Order Specific Question:   Release to patient    Answer:   Immediate    Order Specific Question:   Radiology Contrast Protocol - do NOT remove file path    Answer:   [email or web address]  . CBC with Differential ([facility] Only)    Standing Status:   Future    Number of Occurrences:   1    Standing Expiration Date:   12/23/2020  . CMP ([facility] only)    Standing Status:   Future    Number of Occurrences:   1    Standing Expiration Date:   12/23/2020  . Lactate dehydrogenase (LDH)    Standing Status:   Future    Number of Occurrences:   1    Standing Expiration Date:   12/23/2020  . Sedimentation rate    Standing Status:   Future    Number of Occurrences:   1    Standing Expiration Date:   12/23/2020  . Save Smear (SSMR)    Standing Status:   Future    Number of Occurrences:   1    Standing Expiration Date:   12/23/2020  . ECHOCARDIOGRAM COMPLETE    Standing Status:   Future    Standing Expiration Date:   03/23/2021    Order Specific Question:   Where should this test be performed    Answer:   [place]    Order Specific Question:   Perflutren DEFINITY (image enhancing agent) should be  administered unless hypersensitivity or allergy exist    Answer:   Administer Perflutren    Order Specific Question:   Is a special reader required? (athlete or structural heart)    Answer:   No    Order Specific Question:   Reason for exam-Echo    Answer:   Chemotherapy evaluation  v87.41 / v58.11    Order Specific Question:   Release to patient    Answer:   Immediate  . Pulmonary Function Test    In preparation for chemotherapy with bleomycin    Standing Status:   Future    Standing Expiration Date:   12/23/2020    Order Specific Question:   Where should this test be performed?    Answer:   [facility]    Order Specific Question:   Full PFT: includes the following: basic spirometry, spirometry pre & post bronchodilator, diffusion capacity (DLCO), lung volumes    Answer:   Full PFT    Order Specific  Question:   Release to patient    Answer:   Immediate    All questions were answered. The patient knows to call the clinic with any problems, questions or concerns.  A total of more than 60 minutes were spent on this encounter and over half of that time was spent on counseling and coordination of care as outlined above.   [name], MD Department of Hematology/Oncology [place] at [facility] Phone: [phone] Pager: [phone] Email: [email or web address]  12/24/2019 2:53 PM

## 2019-12-24 ENCOUNTER — Encounter: Payer: Self-pay | Admitting: Hematology and Oncology

## 2019-12-24 ENCOUNTER — Other Ambulatory Visit: Payer: Self-pay

## 2019-12-24 ENCOUNTER — Inpatient Hospital Stay: Payer: BC Managed Care – PPO

## 2019-12-24 ENCOUNTER — Inpatient Hospital Stay: Payer: BC Managed Care – PPO | Attending: Hematology and Oncology | Admitting: Hematology and Oncology

## 2019-12-24 VITALS — BP 158/91 | HR 88 | Temp 97.8°F | Resp 188 | Ht 73.0 in | Wt 254.7 lb

## 2019-12-24 DIAGNOSIS — Z79899 Other long term (current) drug therapy: Secondary | ICD-10-CM | POA: Insufficient documentation

## 2019-12-24 DIAGNOSIS — C8174 Other classical Hodgkin lymphoma, lymph nodes of axilla and upper limb: Secondary | ICD-10-CM | POA: Diagnosis not present

## 2019-12-24 DIAGNOSIS — K219 Gastro-esophageal reflux disease without esophagitis: Secondary | ICD-10-CM | POA: Diagnosis not present

## 2019-12-24 DIAGNOSIS — I10 Essential (primary) hypertension: Secondary | ICD-10-CM | POA: Diagnosis not present

## 2019-12-24 DIAGNOSIS — R5383 Other fatigue: Secondary | ICD-10-CM | POA: Insufficient documentation

## 2019-12-24 DIAGNOSIS — M25511 Pain in right shoulder: Secondary | ICD-10-CM | POA: Diagnosis not present

## 2019-12-24 DIAGNOSIS — F17210 Nicotine dependence, cigarettes, uncomplicated: Secondary | ICD-10-CM | POA: Diagnosis not present

## 2019-12-24 DIAGNOSIS — J449 Chronic obstructive pulmonary disease, unspecified: Secondary | ICD-10-CM | POA: Diagnosis not present

## 2019-12-24 LAB — CBC WITH DIFFERENTIAL (CANCER CENTER ONLY)
Abs Immature Granulocytes: 0.04 10*3/uL (ref 0.00–0.07)
Basophils Absolute: 0.1 10*3/uL (ref 0.0–0.1)
Basophils Relative: 1 %
Eosinophils Absolute: 0.2 10*3/uL (ref 0.0–0.5)
Eosinophils Relative: 2 %
HCT: 42.5 % (ref 39.0–52.0)
Hemoglobin: 13.8 g/dL (ref 13.0–17.0)
Immature Granulocytes: 0 %
Lymphocytes Relative: 27 %
Lymphs Abs: 3.1 10*3/uL (ref 0.7–4.0)
MCH: 28.9 pg (ref 26.0–34.0)
MCHC: 32.5 g/dL (ref 30.0–36.0)
MCV: 88.9 fL (ref 80.0–100.0)
Monocytes Absolute: 1.1 10*3/uL — ABNORMAL HIGH (ref 0.1–1.0)
Monocytes Relative: 9 %
Neutro Abs: 6.7 10*3/uL (ref 1.7–7.7)
Neutrophils Relative %: 61 %
Platelet Count: 371 10*3/uL (ref 150–400)
RBC: 4.78 MIL/uL (ref 4.22–5.81)
RDW: 13.6 % (ref 11.5–15.5)
WBC Count: 11.2 10*3/uL — ABNORMAL HIGH (ref 4.0–10.5)
nRBC: 0 % (ref 0.0–0.2)

## 2019-12-24 LAB — CMP (CANCER CENTER ONLY)
ALT: 15 U/L (ref 0–44)
AST: 14 U/L — ABNORMAL LOW (ref 15–41)
Albumin: 3.9 g/dL (ref 3.5–5.0)
Alkaline Phosphatase: 148 U/L — ABNORMAL HIGH (ref 38–126)
Anion gap: 11 (ref 5–15)
BUN: 22 mg/dL (ref 8–23)
CO2: 22 mmol/L (ref 22–32)
Calcium: 9 mg/dL (ref 8.9–10.3)
Chloride: 111 mmol/L (ref 98–111)
Creatinine: 1.04 mg/dL (ref 0.61–1.24)
GFR, Est AFR Am: >60 mL/min (ref >60)
GFR, Estimated: >60 mL/min (ref >60)
Glucose, Bld: 108 mg/dL — ABNORMAL HIGH (ref 70–99)
Potassium: 4.1 mmol/L (ref 3.5–5.1)
Sodium: 144 mmol/L (ref 135–145)
Total Bilirubin: <0.2 mg/dL — ABNORMAL LOW (ref 0.3–1.2)
Total Protein: 7.1 g/dL (ref 6.5–8.1)

## 2019-12-24 LAB — LACTATE DEHYDROGENASE: LDH: 152 U/L (ref 98–192)

## 2019-12-24 LAB — SAVE SMEAR(SSMR), FOR PROVIDER SLIDE REVIEW

## 2019-12-24 LAB — SEDIMENTATION RATE: Sed Rate: 10 mm/hr (ref 0–16)

## 2019-12-25 ENCOUNTER — Telehealth: Payer: Self-pay | Admitting: Hematology and Oncology

## 2019-12-25 NOTE — Telephone Encounter (Signed)
Scheduled per los. Called and spoke with patient. Confirmed appt 

## 2019-12-26 DIAGNOSIS — Z9889 Other specified postprocedural states: Secondary | ICD-10-CM | POA: Diagnosis not present

## 2019-12-30 ENCOUNTER — Other Ambulatory Visit: Payer: Self-pay

## 2019-12-30 ENCOUNTER — Ambulatory Visit (HOSPITAL_COMMUNITY)
Admission: RE | Admit: 2019-12-30 | Discharge: 2019-12-30 | Disposition: A | Discharge status: Home / Self Care | Payer: BC Managed Care – PPO | Source: Ambulatory Visit | Attending: Hematology and Oncology | Admitting: Hematology and Oncology

## 2019-12-30 DIAGNOSIS — Z96642 Presence of left artificial hip joint: Secondary | ICD-10-CM | POA: Diagnosis not present

## 2019-12-30 DIAGNOSIS — C8174 Other classical Hodgkin lymphoma, lymph nodes of axilla and upper limb: Secondary | ICD-10-CM | POA: Insufficient documentation

## 2019-12-30 DIAGNOSIS — R591 Generalized enlarged lymph nodes: Secondary | ICD-10-CM | POA: Diagnosis not present

## 2019-12-30 DIAGNOSIS — Z96611 Presence of right artificial shoulder joint: Secondary | ICD-10-CM | POA: Insufficient documentation

## 2019-12-30 DIAGNOSIS — Z01810 Encounter for preprocedural cardiovascular examination: Secondary | ICD-10-CM | POA: Diagnosis not present

## 2019-12-30 DIAGNOSIS — D3502 Benign neoplasm of left adrenal gland: Secondary | ICD-10-CM | POA: Diagnosis not present

## 2019-12-30 DIAGNOSIS — I119 Hypertensive heart disease without heart failure: Secondary | ICD-10-CM | POA: Insufficient documentation

## 2019-12-30 DIAGNOSIS — E041 Nontoxic single thyroid nodule: Secondary | ICD-10-CM | POA: Diagnosis not present

## 2019-12-30 DIAGNOSIS — F17200 Nicotine dependence, unspecified, uncomplicated: Secondary | ICD-10-CM | POA: Insufficient documentation

## 2019-12-30 NOTE — Progress Notes (Signed)
  Echocardiogram 2D Echocardiogram has been performed.  Marc Watson 12/30/2019, 10:31 AM

## 2019-12-31 ENCOUNTER — Ambulatory Visit (HOSPITAL_COMMUNITY)
Admission: RE | Admit: 2019-12-31 | Discharge: 2019-12-31 | Disposition: A | Discharge status: Home / Self Care | Payer: BC Managed Care – PPO | Source: Ambulatory Visit | Attending: Hematology and Oncology | Admitting: Hematology and Oncology

## 2019-12-31 DIAGNOSIS — E041 Nontoxic single thyroid nodule: Secondary | ICD-10-CM | POA: Diagnosis not present

## 2019-12-31 DIAGNOSIS — Z96642 Presence of left artificial hip joint: Secondary | ICD-10-CM | POA: Diagnosis not present

## 2019-12-31 DIAGNOSIS — C8174 Other classical Hodgkin lymphoma, lymph nodes of axilla and upper limb: Secondary | ICD-10-CM | POA: Diagnosis not present

## 2019-12-31 DIAGNOSIS — R591 Generalized enlarged lymph nodes: Secondary | ICD-10-CM | POA: Diagnosis not present

## 2019-12-31 DIAGNOSIS — Z01810 Encounter for preprocedural cardiovascular examination: Secondary | ICD-10-CM | POA: Diagnosis not present

## 2019-12-31 DIAGNOSIS — Z96611 Presence of right artificial shoulder joint: Secondary | ICD-10-CM | POA: Diagnosis not present

## 2019-12-31 DIAGNOSIS — C8190 Hodgkin lymphoma, unspecified, unspecified site: Secondary | ICD-10-CM | POA: Diagnosis not present

## 2019-12-31 DIAGNOSIS — D3502 Benign neoplasm of left adrenal gland: Secondary | ICD-10-CM | POA: Diagnosis not present

## 2019-12-31 DIAGNOSIS — M25611 Stiffness of right shoulder, not elsewhere classified: Secondary | ICD-10-CM | POA: Diagnosis not present

## 2019-12-31 DIAGNOSIS — F17200 Nicotine dependence, unspecified, uncomplicated: Secondary | ICD-10-CM | POA: Diagnosis not present

## 2019-12-31 DIAGNOSIS — I119 Hypertensive heart disease without heart failure: Secondary | ICD-10-CM | POA: Diagnosis not present

## 2019-12-31 LAB — GLUCOSE, CAPILLARY: Glucose-Capillary: 108 mg/dL — ABNORMAL HIGH (ref 70–99)

## 2019-12-31 MED ORDER — FLUDEOXYGLUCOSE F - 18 (FDG) INJECTION: 12.5000 | Freq: Once | INTRAVENOUS | Status: DC | PRN

## 2020-01-02 ENCOUNTER — Other Ambulatory Visit: Payer: Self-pay | Admitting: Radiology

## 2020-01-02 ENCOUNTER — Telehealth: Payer: Self-pay | Admitting: *Deleted

## 2020-01-02 ENCOUNTER — Telehealth: Payer: Self-pay | Admitting: Hematology and Oncology

## 2020-01-02 ENCOUNTER — Other Ambulatory Visit: Payer: Self-pay | Admitting: Hematology and Oncology

## 2020-01-02 DIAGNOSIS — C8104 Nodular lymphocyte predominant Hodgkin lymphoma, lymph nodes of axilla and upper limb: Secondary | ICD-10-CM

## 2020-01-02 DIAGNOSIS — C8190 Hodgkin lymphoma, unspecified, unspecified site: Secondary | ICD-10-CM | POA: Insufficient documentation

## 2020-01-02 NOTE — Telephone Encounter (Signed)
TCT Kaitlyn Scotton in Respiratory Therapy to schedule PFT's for patient. No answer but was able to leave vm message for her to have this scheduled.

## 2020-01-02 NOTE — Telephone Encounter (Signed)
Called Marc Watson to discuss the results of his Echo and PET scan from earlier this week. PET scan shows FDG avidity in the axillary lymph node with some enlargement of other lymph nodes in the chest, but not as FDG avid. The TTE was also reassuring for good baseline cardiac function. Our plan at this time will be to have PFTs performed and Port placed, with consideration of chemotherapy to start the week of 01/14/2020.   Marc Watson voiced his understanding of this plan.  [name], MD Department of Hematology/Oncology [place] at [facility] Phone: [phone] Pager: [phone] Email: [email or web address]

## 2020-01-05 ENCOUNTER — Ambulatory Visit (HOSPITAL_COMMUNITY)
Admission: RE | Admit: 2020-01-05 | Discharge: 2020-01-05 | Disposition: A | Discharge status: Home / Self Care | Payer: BC Managed Care – PPO | Source: Ambulatory Visit | Attending: Hematology and Oncology | Admitting: Hematology and Oncology

## 2020-01-05 ENCOUNTER — Other Ambulatory Visit: Payer: Self-pay

## 2020-01-05 ENCOUNTER — Encounter (HOSPITAL_COMMUNITY): Payer: Self-pay

## 2020-01-05 DIAGNOSIS — K219 Gastro-esophageal reflux disease without esophagitis: Secondary | ICD-10-CM | POA: Diagnosis not present

## 2020-01-05 DIAGNOSIS — M1990 Unspecified osteoarthritis, unspecified site: Secondary | ICD-10-CM | POA: Insufficient documentation

## 2020-01-05 DIAGNOSIS — F17210 Nicotine dependence, cigarettes, uncomplicated: Secondary | ICD-10-CM | POA: Diagnosis not present

## 2020-01-05 DIAGNOSIS — Z79899 Other long term (current) drug therapy: Secondary | ICD-10-CM | POA: Insufficient documentation

## 2020-01-05 DIAGNOSIS — J449 Chronic obstructive pulmonary disease, unspecified: Secondary | ICD-10-CM | POA: Insufficient documentation

## 2020-01-05 DIAGNOSIS — Z452 Encounter for adjustment and management of vascular access device: Secondary | ICD-10-CM | POA: Diagnosis not present

## 2020-01-05 DIAGNOSIS — C8104 Nodular lymphocyte predominant Hodgkin lymphoma, lymph nodes of axilla and upper limb: Secondary | ICD-10-CM | POA: Insufficient documentation

## 2020-01-05 DIAGNOSIS — I10 Essential (primary) hypertension: Secondary | ICD-10-CM | POA: Insufficient documentation

## 2020-01-05 HISTORY — PX: IR IMAGING GUIDED PORT INSERTION: IMG5740

## 2020-01-05 LAB — CBC WITH DIFFERENTIAL/PLATELET
Abs Immature Granulocytes: 0.03 10*3/uL (ref 0.00–0.07)
Basophils Absolute: 0.1 10*3/uL (ref 0.0–0.1)
Basophils Relative: 1 %
Eosinophils Absolute: 0.3 10*3/uL (ref 0.0–0.5)
Eosinophils Relative: 3 %
HCT: 42.8 % (ref 39.0–52.0)
Hemoglobin: 14 g/dL (ref 13.0–17.0)
Immature Granulocytes: 0 %
Lymphocytes Relative: 34 %
Lymphs Abs: 3.5 10*3/uL (ref 0.7–4.0)
MCH: 29.4 pg (ref 26.0–34.0)
MCHC: 32.7 g/dL (ref 30.0–36.0)
MCV: 89.9 fL (ref 80.0–100.0)
Monocytes Absolute: 1 10*3/uL (ref 0.1–1.0)
Monocytes Relative: 10 %
Neutro Abs: 5.3 10*3/uL (ref 1.7–7.7)
Neutrophils Relative %: 52 %
Platelets: 354 10*3/uL (ref 150–400)
RBC: 4.76 MIL/uL (ref 4.22–5.81)
RDW: 13.6 % (ref 11.5–15.5)
WBC: 10.2 10*3/uL (ref 4.0–10.5)
nRBC: 0 % (ref 0.0–0.2)

## 2020-01-05 MED ORDER — CEFAZOLIN SODIUM-DEXTROSE 2-4 GM/100ML-% IV SOLN
INTRAVENOUS | Status: AC
  Filled 2020-01-05: qty 100

## 2020-01-05 MED ORDER — CEFAZOLIN SODIUM-DEXTROSE 2-4 GM/100ML-% IV SOLN
2.0000 g | INTRAVENOUS | Status: AC
  Administered 2020-01-05: 2 g via INTRAVENOUS

## 2020-01-05 MED ORDER — MIDAZOLAM HCL 2 MG/2ML IJ SOLN
INTRAMUSCULAR | Status: AC | PRN
  Administered 2020-01-05 (×2): 1 mg via INTRAVENOUS

## 2020-01-05 MED ORDER — HEPARIN SOD (PORK) LOCK FLUSH 100 UNIT/ML IV SOLN
INTRAVENOUS | Status: AC
  Filled 2020-01-05: qty 5

## 2020-01-05 MED ORDER — MIDAZOLAM HCL 2 MG/2ML IJ SOLN
INTRAMUSCULAR | Status: AC
  Filled 2020-01-05: qty 2

## 2020-01-05 MED ORDER — SODIUM CHLORIDE 0.9 % IV SOLN: INTRAVENOUS | Status: AC | PRN

## 2020-01-05 MED ORDER — FENTANYL CITRATE (PF) 100 MCG/2ML IJ SOLN
INTRAMUSCULAR | Status: AC
  Filled 2020-01-05: qty 2

## 2020-01-05 MED ORDER — LIDOCAINE HCL 1 % IJ SOLN
INTRAMUSCULAR | Status: AC
  Filled 2020-01-05: qty 20

## 2020-01-05 MED ORDER — SODIUM CHLORIDE 0.9 % IV SOLN: INTRAVENOUS | Status: DC

## 2020-01-05 MED ORDER — FENTANYL CITRATE (PF) 100 MCG/2ML IJ SOLN
INTRAMUSCULAR | Status: AC | PRN
  Administered 2020-01-05 (×2): 50 ug via INTRAVENOUS

## 2020-01-05 NOTE — Discharge Instructions (Signed)
No lidocaine cream over your new port until it has healed. The lidocaine cream has petroleum in it which will dissolve the skin glue. Let it heal for two weeks before using lidocaine cream. You can put ice into a zip lock bag and place over your new port prior to nurses accessing your new port with a needle. You only need to keep ice on it for 2-3 minutes to numb the area.     Implanted Port Insertion, Care After This sheet gives you information about how to care for yourself after your procedure. Your health care provider may also give you more specific instructions. If you have problems or questions, contact your health care provider. What can I expect after the procedure? After the procedure, it is common to have:  Discomfort at the port insertion site.  Bruising on the skin over the port. This should improve over 3-4 days. Follow these instructions at home: [facility] care  After your port is placed, you will get a manufacturer's information card. The card has information about your port. Keep this card with you at all times.  Take care of the port as told by your health care provider. Ask your health care provider if you or a family member can get training for taking care of the port at home. A home health care nurse may also take care of the port.  Make sure to remember what type of port you have. Incision care      Follow instructions from your health care provider about how to take care of your port insertion site. Make sure you: ? Wash your hands with soap and water before and after you change your bandage (dressing). If soap and water are not available, use hand sanitizer. ? Change your dressing as told by your health care provider.  Leave  skin glue in place. These skin closures may need to stay in place for 2 weeks or longer.   Check your port insertion site every day for signs of infection. Check for: ? Redness, swelling, or pain. ? Fluid or blood. ? Warmth. ? Pus or a bad  smell. Activity  Return to your normal activities as told by your health care provider. Ask your health care provider what activities are safe for you.  Do not lift anything that is heavier than 10 lb (4.5 kg), or the limit that you are told, until your health care provider says that it is safe. General instructions  Take over-the-counter and prescription medicines only as told by your health care provider.  Do not take baths, swim, or use a hot tub until your health care provider approves. You may shower tomorrow about 4:30PM.   Do not drive for 24 hours if you were given a sedative during your procedure.  Wear a medical alert bracelet in case of an emergency. This will tell any health care providers that you have a port.  Keep all follow-up visits as told by your health care provider. This is important. Contact a health care provider if:  You cannot flush your port with saline as directed, or you cannot draw blood from the port.  You have a fever or chills.  You have redness, swelling, or pain around your port insertion site.  You have fluid or blood coming from your port insertion site.  Your port insertion site feels warm to the touch.  You have pus or a bad smell coming from the port insertion site. Get help right away if:  You have chest pain or shortness of breath.  You have bleeding from your port that you cannot control. Summary  Take care of the port as told by your health care provider. Keep the manufacturer's information card with you at all times.  Change your dressing as told by your health care provider.  Contact a health care provider if you have a fever or chills or if you have redness, swelling, or pain around your port insertion site.  Keep all follow-up visits as told by your health care provider. This information is not intended to replace advice given to you by your health care provider. Make sure you discuss any questions you have with your health  care provider. Document Revised: 06/25/2018 Document Reviewed: 06/25/2018 Elsevier Patient Education  [place].      Moderate Conscious Sedation, Adult, Care After These instructions provide you with information about caring for yourself after your procedure. Your health care provider may also give you more specific instructions. Your treatment has been planned according to current medical practices, but problems sometimes occur. Call your health care provider if you have any problems or questions after your procedure. What can I expect after the procedure? After your procedure, it is common:  To feel sleepy for several hours.  To feel clumsy and have poor balance for several hours.  To have poor judgment for several hours.  To vomit if you eat too soon. Follow these instructions at home: For at least 24 hours after the procedure:   Do not: ? Participate in activities where you could fall or become injured. ? Drive. ? Use heavy machinery. ? Drink alcohol. ? Take sleeping pills or medicines that cause drowsiness. ? Make important decisions or sign legal documents. ? Take care of children on your own.  Rest. Eating and drinking  Follow the diet recommended by your health care provider.  If you vomit: ? Drink water, juice, or soup when you can drink without vomiting. ? Make sure you have little or no nausea before eating solid foods. General instructions  Have a responsible adult stay with you until you are awake and alert.  Take over-the-counter and prescription medicines only as told by your health care provider.  If you smoke, do not smoke without supervision.  Keep all follow-up visits as told by your health care provider. This is important. Contact a health care provider if:  You keep feeling nauseous or you keep vomiting.  You feel light-headed.  You develop a rash.  You have a fever. Get help right away if:  You have trouble breathing. This  information is not intended to replace advice given to you by your health care provider. Make sure you discuss any questions you have with your health care provider. Document Revised: 11/09/2017 Document Reviewed: 03/18/2016 Elsevier Patient Education  2020 [employer].

## 2020-01-05 NOTE — Procedures (Signed)
Interventional Radiology Procedure Note  Procedure: Placement of a right IJ approach single lumen PowerPort.  Tip is positioned at the superior cavoatrial junction and catheter is ready for immediate use.  Complications: None Recommendations:  - Ok to shower tomorrow - Do not submerge for 7 days - Routine line care   Signed,  Keyasia Jolliff S. Sadia Belfiore, DO   

## 2020-01-05 NOTE — Consult Note (Signed)
Chief Complaint: Patient was seen in consultation today for port a cath placement  Referring Physician(s): Dorsey,John T IV  Supervising Physician: Corrie Mckusick  Patient Status: [place]  History of Present Illness: Marc Watson is a 64 y.o. male with hx COPD, GERD, HTN and newly diagnosed Hodgkin's lymphoma who presents today for port a cath placement for chemotherapy.   Past Medical History:  Diagnosis Date   Arthritis    COPD (chronic obstructive pulmonary disease) ([place])    per CXR on 11/11/2019   GERD (gastroesophageal reflux disease)    occ   Hypertension    recently started taking on 11/05/2019    Past Surgical History:  Procedure Laterality Date   JOINT REPLACEMENT  2018   left hip   KNEE SURGERY Right    cartilage   LEG SURGERY Left 1969   "stunted growth left leg"   TOTAL HIP ARTHROPLASTY Left 03/27/2017   TOTAL HIP ARTHROPLASTY Left 03/27/2017   Procedure: TOTAL HIP ARTHROPLASTY ANTERIOR APPROACH;  Surgeon: Melrose Nakayama, MD;  Location: [place];  Service: Orthopedics;  Laterality: Left;   TOTAL SHOULDER ARTHROPLASTY Right 11/13/2019   Procedure: TOTAL SHOULDER ARTHROPLASTY;  Surgeon: Tania Ade, MD;  Location: [facility];  Service: Orthopedics;  Laterality: Right;    Allergies: No known allergies  Medications: Prior to Admission medications   Medication Sig Start Date End Date Taking? Authorizing Provider  losartan (COZAAR) 25 MG tablet Take 25 mg by mouth daily.   Yes [provider]  naproxen (NAPROSYN) 250 MG tablet Take by mouth 2 (two) times daily with a meal. As needed   Yes [provider]  Omega-3 Fatty Acids (FISH OIL PO) Take 2 capsules by mouth daily. OMEGA XL PROPRIETARY BLEND 300 MG d-ALPHA TOCOPHEROL (VITAMIN E)/EXTRA VIRGIN OLIVE OIL/EXTRACT (pcso-524) CONTAINING OMEGA FATTY ACIDS, GREEN LIPPED MUSSEL (PERNA CANLICULUS) OIL)    Yes [provider]  cyclobenzaprine (FLEXERIL) 10 MG tablet Take  10 mg by mouth at bedtime as needed for muscle spasms.  08/08/19   [provider]  oxyCODONE-acetaminophen (PERCOCET) 5-325 MG tablet Take 1-2 tablets every 4 hours as needed for post operative pain. MAX 6/day Patient not taking: Reported on 12/24/2019 11/13/19   Grier Mitts, PA-C     History reviewed. No pertinent family history.  Social History   Socioeconomic History   Marital status: Married    Spouse name: Not on file   Number of children: Not on file   Years of education: Not on file   Highest education level: Not on file  Occupational History   Not on file  Tobacco Use   Smoking status: Current Every Day Smoker    Packs/day: 0.50    Years: 30.00    Pack years: 15.00   Smokeless tobacco: Never Used  Substance and Sexual Activity   Alcohol use: Yes    Comment: occassionally   Drug use: Yes    Types: Cocaine    Comment: last used 02/2017- none since   Sexual activity: Not on file  Other Topics Concern   Not on file  Social History Narrative   Not on file   Social Determinants of Health   Financial Resource Strain:    Difficulty of Paying Living Expenses: Not on file  Food Insecurity:    Worried About [place] in the Last Year: Not on file   [employer] of Food in the Last Year: Not on file  Transportation Needs:  Lack of Transportation (Medical): Not on file   Lack of Transportation (Non-Medical): Not on file  Physical Activity:    Days of Exercise per Week: Not on file   Minutes of Exercise per Session: Not on file  Stress:    Feeling of Stress : Not on file  Social Connections:    Frequency of Communication with Friends and Family: Not on file   Frequency of Social Gatherings with Friends and Family: Not on file   Attends Religious Services: Not on file   Active Member of Golf or Organizations: Not on file   Attends Archivist Meetings: Not on file   Marital Status: Not on file      Review  of Systems denies fever,HA, CP, dyspnea, cough, abd/back pain, N/V or bleeding  Vital Signs: BP 128/87    Pulse 71    Temp 98.2 F (36.8 C) (Oral)    Resp 17    SpO2 96%   Physical Exam  Awake/alert; chest- CTA bilat; heart- RRR; abd- soft,+BS,NT; no LE edema Imaging: NM PET Image Initial (PI) Skull Base To Thigh  Result Date: 12/31/2019 CLINICAL DATA:  Initial treatment strategy for Hodgkin lymphoma. EXAM: NUCLEAR MEDICINE PET SKULL BASE TO THIGH TECHNIQUE: 12.5 mCi F-18 FDG was injected intravenously. Full-ring PET imaging was performed from the skull base to thigh after the radiotracer. CT data was obtained and used for attenuation correction and anatomic localization. Fasting blood glucose: 108 mg/dl COMPARISON:  Axillary ultrasound from 12/15/2019 FINDINGS: Mediastinal blood pool activity: SUV max 2.4 Liver activity: SUV max 3.7 NECK: Increased metabolic activity in tonsillar tissue, palatine tonsils bilaterally (SUVmax = 8.4 Mildly enlarged lymph nodes in the bilateral neck with mild increased FDG avidity seen at level II 2 and III tracking into IV. Representative node at level 2 on the left (image 29, series 4) short axis dimension of 1.3 cm (SUVmax = 2.9) ) Incidental CT findings: none CHEST: (Image 81, series 4) largest right axillary lymph node measuring 1.6 cm short axis (SUVmax = 11.0 smaller nodes with similar or slightly diminished activity in the right axilla. Left axillary lymph nodes with mild enlargement, retaining fatty hila and with FDG uptake less than or equal to blood pool. Also with high AP window lymph node, the largest of mediastinal lymph nodes (image 75, series 4) 1.5 cm (SUVmax = 2.9) no additional areas of suspicion in the chest.) Incidental CT findings: Scattered calcific atherosclerotic changes and coronary arteries. Nodular thyroid. 2.0 cm nodule rising from left inferior thyroid. This does not show FDG uptake beyond background. ABDOMEN/PELVIS: No abnormal hypermetabolic  activity within the liver, pancreas, adrenal glands, or spleen. No hypermetabolic lymph nodes in the abdomen or pelvis. Incidental CT findings: 1.6 cm left adrenal adenoma. Noncontrast appearance of liver, gallbladder, spleen, pancreas, right adrenal gland and right kidney are unremarkable. The cortical scarring involving left kidney. Small low-density lesions in the lower pole, likely cysts, also in the upper pole the left kidney. No acute bowel process.  Colonic diverticulosis.  Normal appendix. Signs of calcified atherosclerotic change of the abdominal aorta without aneurysm. Scattered small lymph nodes none with pathologic enlargement or increased FDG uptake in the retroperitoneum and upper abdomen. SKELETON: Marked degenerative changes with resultant FDG uptake about the right shoulder following right shoulder arthroplasty. Expected photopenia about a left hip arthroplasty. Incidental CT findings: Arthroplasty changes in the right shoulder and left hip. IMPRESSION: 1. Signs of right axillary adenopathy with increased metabolic activity as described. 2. Tonsillar activity  in the neck that is increased, greater than twice that of background liver. Attention on follow-up. 3. Mildly enlarged lymph nodes in the neck, left axilla and mediastinum as described without increased metabolic activity, attention on follow-up. 4. Signs of nodular thyroid with 2 cm left thyroid nodule. Recommend thyroid US (ref: J Am Coll Radiol. 2015 Feb;12(2): 143-50). 5. Signs of recent right shoulder arthroplasty and prior left hip arthroplasty. 6. Left adrenal adenoma. Electronically Signed   By: Zetta Bills M.D.   On: 12/31/2019 09:27   MM DIAG BREAST TOMO BILATERAL  Result Date: 12/10/2019 CLINICAL DATA:  Patient presents for enlarged right axillary lymph node seen on prior CT and MR in October 2020. Patient had severe right shoulder OA and is now status post right shoulder arthroplasty. EXAM: DIGITAL DIAGNOSTIC BILATERAL  MAMMOGRAM WITH CAD AND TOMO COMPARISON:  Previous exam(s). ACR Breast Density Category a: The breast tissue is almost entirely fatty. FINDINGS: Mammogram: No suspicious mass, distortion, or microcalcifications are identified to suggest presence of malignancy in the bilateral breasts. There are asymmetrically enlarged right axillary lymph nodes, measuring up to 1.9 cm. Mammographic images were processed with CAD. Ultrasound: Targeted ultrasound of the right axilla demonstrates several enlarged lymph nodes, the largest which is rounded in shape and without fatty hilum measuring 2.0 x 1.9 x 2.0 cm. IMPRESSION: Right axillary adenopathy is indeterminate, possibly related to the patient's right shoulder surgery. However tissue sampling is recommended to exclude malignancy. RECOMMENDATION: Ultrasound-guided core needle biopsy of the largest right axillary lymph node. This will be scheduled at the patient's convenience. I have discussed the findings and recommendations with the patient. If applicable, a reminder letter will be sent to the patient regarding the next appointment. BI-RADS CATEGORY  4: Suspicious. Electronically Signed   By: Audie Pinto M.D.   On: 12/10/2019 08:32   ECHOCARDIOGRAM COMPLETE  Result Date: 12/30/2019   ECHOCARDIOGRAM REPORT   Patient Name:   Marc Watson Date of Exam: 12/30/2019 Medical Rec #:  [id number]        Height:       73.0 in Accession #:    [id number]       Weight:       254.7 lb Date of Birth:  26-Mar-1956        BSA:          2.38 m Patient Age:    81 years         BP:           158/91 mmHg Patient Gender: M                HR:           80 bpm. Exam Location:  Outpatient Procedure: 2D Echo Indications:    chemotherapy evaluation  History:        Patient has no prior history of Echocardiogram examinations.                 COPD; Risk Factors:Hypertension and Current Smoker. Hx of                 cocaine use.  Sonographer:    Jannett Celestine RDCS (AE) Referring Phys: [id number] [name] IV  Sonographer Comments: Suboptimal parasternal window. IMPRESSIONS  1. Left ventricular ejection fraction, by visual estimation, is 65 to 70%. The left ventricle has normal function. There is no left ventricular hypertrophy.  2. The left ventricle has no regional wall motion abnormalities.  3. Global right  ventricle has normal systolic function.The right ventricular size is mildly enlarged. No increase in right ventricular wall thickness.  4. Left atrial size was normal.  5. Right atrial size was normal.  6. The mitral valve is normal in structure. No evidence of mitral valve regurgitation. No evidence of mitral stenosis.  7. The tricuspid valve is normal in structure. Tricuspid valve regurgitation is trivial.  8. The aortic valve was not well visualized. Aortic valve regurgitation is not visualized. No evidence of aortic valve sclerosis or stenosis.  9. The pulmonic valve was normal in structure. Pulmonic valve regurgitation is not visualized. 10. The inferior vena cava is normal in size with greater than 50% respiratory variability, suggesting right atrial pressure of 3 mmHg. 11. TR signal is inadequate for assessing pulmonary artery systolic pressure. 12. The average left ventricular global longitudinal strain is -20.3 %. This value may be underestimated due to suboptimal tracking in the inferoseptal basal segment. FINDINGS  Left Ventricle: Left ventricular ejection fraction, by visual estimation, is 65 to 70%. The left ventricle has normal function. The average left ventricular global longitudinal strain is -20.3 %. The left ventricle has no regional wall motion abnormalities. There is no left ventricular hypertrophy. Left ventricular diastolic parameters were normal. Normal left atrial pressure. Right Ventricle: The right ventricular size is mildly enlarged. No increase in right ventricular wall thickness. Global RV systolic function is has normal systolic function. Left Atrium: Left atrial size was  normal in size. Right Atrium: Right atrial size was normal in size Pericardium: There is no evidence of pericardial effusion. Mitral Valve: The mitral valve is normal in structure. No evidence of mitral valve regurgitation. No evidence of mitral valve stenosis by observation. Tricuspid Valve: The tricuspid valve is normal in structure. Tricuspid valve regurgitation is trivial. Aortic Valve: The aortic valve was not well visualized. Aortic valve regurgitation is not visualized. The aortic valve is structurally normal, with no evidence of sclerosis or stenosis. Pulmonic Valve: The pulmonic valve was normal in structure. Pulmonic valve regurgitation is not visualized. Pulmonic regurgitation is not visualized. Aorta: The aortic root and ascending aorta are structurally normal, with no evidence of dilitation. Venous: The inferior vena cava is normal in size with greater than 50% respiratory variability, suggesting right atrial pressure of 3 mmHg. IAS/Shunts: The interatrial septum was not well visualized.  LEFT VENTRICLE PLAX 2D LVIDd:         5.40 cm  Diastology LVIDs:         3.10 cm  LV e' lateral:   10.20 cm/s LV PW:         0.90 cm  LV E/e' lateral: 7.7 LV IVS:        0.80 cm  LV e' medial:    8.05 cm/s LVOT diam:     2.30 cm  LV E/e' medial:  9.7 LV SV:         103 ml LV SV Index:   41.80    2D Longitudinal Strain LVOT Area:     4.15 cm 2D Strain GLS Avg:     -20.3 %  RIGHT VENTRICLE RV S prime:     15.70 cm/s TAPSE (M-mode): 2.4 cm LEFT ATRIUM             Index       RIGHT ATRIUM           Index LA diam:        3.60 cm 1.51 cm/m  RA Area:  19.40 cm LA Vol (A2C):   73.3 ml 30.74 ml/m RA Volume:   50.20 ml  21.05 ml/m LA Vol (A4C):   48.3 ml 20.26 ml/m LA Biplane Vol: 65.1 ml 27.30 ml/m  AORTIC VALVE LVOT Vmax:   106.00 cm/s LVOT Vmean:  62.200 cm/s LVOT VTI:    0.190 m  AORTA Ao Root diam: 3.60 cm MITRAL VALVE MV Area (PHT): 2.17 cm             SHUNTS MV PHT:        101.21 msec          Systemic VTI:   0.19 m MV Decel Time: 349 msec             Systemic Diam: 2.30 cm MV E velocity: 78.30 cm/s 103 cm/s MV A velocity: 64.90 cm/s 70.3 cm/s MV E/A ratio:  1.21       1.5  Cherlynn Kaiser MD Electronically signed by Cherlynn Kaiser MD Signature Date/Time: 12/30/2019/7:56:13 PM    Final    [place] AXILLARY NODE CORE BIOPSY RIGHT  Addendum Date: 12/29/2019   ADDENDUM REPORT: 12/18/2019 08:11 ADDENDUM: Pathology revealed CLASSICAL HODGKIN LYMPHOMA of the RIGHT axillary lymph node. This was found to be concordant by Dr. Lillia Mountain. The patient reported doing well after the biopsy with tenderness at the site. Post biopsy instructions and care were reviewed and questions were answered. The patient was encouraged to call [facility] for any additional concerns. Pathology results were discussed with the patient by telephone with Dr. [name] of [facility]. Dr. Maudie Mercury will arrange for a medical oncology referral. Pathology results reported by Stacie Acres, RN on 12/18/2019. Electronically Signed   By: Lillia Mountain M.D.   On: 12/18/2019 08:11   Result Date: 12/29/2019 CLINICAL DATA:  Enlarged right axillary adenopathy. EXAM: [place] AXILLARY NODE CORE BIOPSY RIGHT COMPARISON:  Previous exam(s). FINDINGS: I met with the patient and we discussed the procedure of ultrasound-guided biopsy, including benefits and alternatives. We discussed the high likelihood of a successful procedure. We discussed the risks of the procedure, including infection, bleeding, tissue injury, clip migration, and inadequate sampling. Informed written consent was given. The usual time-out protocol was performed immediately prior to the procedure. Using sterile technique and 1% lidocaine and 1% lidocaine with epinephrine as local anesthetic, under direct ultrasound visualization, a 14 gauge spring-loaded device was used to perform biopsy of a right axillary lymph node using a lateral to medial approach. At the conclusion of  the procedure [facility] tissue marker clip was deployed into the biopsy cavity. IMPRESSION: Ultrasound guided biopsy of the right axilla. No apparent complications. Electronically Signed: By: Lillia Mountain M.D. On: 12/15/2019 09:10   [place] AXILLA RIGHT  Result Date: 12/10/2019 CLINICAL DATA:  Patient presents for enlarged right axillary lymph node seen on prior CT and MR in October 2020. Patient had severe right shoulder OA and is now status post right shoulder arthroplasty. EXAM: DIGITAL DIAGNOSTIC BILATERAL MAMMOGRAM WITH CAD AND TOMO COMPARISON:  Previous exam(s). ACR Breast Density Category a: The breast tissue is almost entirely fatty. FINDINGS: Mammogram: No suspicious mass, distortion, or microcalcifications are identified to suggest presence of malignancy in the bilateral breasts. There are asymmetrically enlarged right axillary lymph nodes, measuring up to 1.9 cm. Mammographic images were processed with CAD. Ultrasound: Targeted ultrasound of the right axilla demonstrates several enlarged lymph nodes, the largest which is rounded in shape and without fatty hilum measuring 2.0 x 1.9 x  2.0 cm. IMPRESSION: Right axillary adenopathy is indeterminate, possibly related to the patient's right shoulder surgery. However tissue sampling is recommended to exclude malignancy. RECOMMENDATION: Ultrasound-guided core needle biopsy of the largest right axillary lymph node. This will be scheduled at the patient's convenience. I have discussed the findings and recommendations with the patient. If applicable, a reminder letter will be sent to the patient regarding the next appointment. BI-RADS CATEGORY  4: Suspicious. Electronically Signed   By: Audie Pinto M.D.   On: 12/10/2019 08:32    Labs:  CBC: Recent Labs    11/11/19 0851 12/24/19 1214 01/05/20 1300  WBC 10.0 11.2* 10.2  HGB 14.6 13.8 14.0  HCT 45.2 42.5 42.8  PLT 292 371 354    COAGS: Recent Labs    11/11/19 0851  INR 0.9  APTT 30     BMP: Recent Labs    11/11/19 0851 12/24/19 1214  NA 138 144  K 4.0 4.1  CL 106 111  CO2 25 22  GLUCOSE 105* 108*  BUN 16 22  CALCIUM 9.2 9.0  CREATININE 0.96 1.04  GFRNONAA >60 >60  GFRAA >60 >60    LIVER FUNCTION TESTS: Recent Labs    11/11/19 0851 12/24/19 1214  BILITOT 0.6 <0.2*  AST 18 14*  ALT 23 15  ALKPHOS 90 148*  PROT 7.0 7.1  ALBUMIN 4.1 3.9    TUMOR MARKERS: No results for input(s): AFPTM, CEA, CA199, CHROMGRNA in the last 8760 hours.  Assessment and Plan: 64 y.o. male with hx COPD, GERD, HTN and newly diagnosed Hodgkin's lymphoma who presents today for port a cath placement for chemotherapy. Risks and benefits of image guided port-a-catheter placement was discussed with the patient including, but not limited to bleeding, infection, pneumothorax, or fibrin sheath development and need for additional procedures.  All of the patient's questions were answered, patient is agreeable to proceed. Consent signed and in chart.     Thank you for this interesting consult.  I greatly enjoyed meeting Marc Watson and look forward to participating in their care.  A copy of this report was sent to the requesting provider on this date.  Electronically Signed: D. Rowe Robert, PA-C 01/05/2020, 2:11 PM    I spent a total of  25 minutes   in face to face in clinical consultation, greater than 50% of which was counseling/coordinating care for port a cath placement

## 2020-01-06 ENCOUNTER — Encounter (HOSPITAL_COMMUNITY): Payer: Self-pay

## 2020-01-06 ENCOUNTER — Other Ambulatory Visit: Payer: Self-pay | Admitting: Hematology and Oncology

## 2020-01-06 DIAGNOSIS — C8104 Nodular lymphocyte predominant Hodgkin lymphoma, lymph nodes of axilla and upper limb: Secondary | ICD-10-CM

## 2020-01-09 ENCOUNTER — Telehealth: Payer: Self-pay | Admitting: Hematology and Oncology

## 2020-01-09 NOTE — Telephone Encounter (Signed)
Scheduled per sch msg. Called and spoke with patient. Confirmed appts  

## 2020-01-12 DIAGNOSIS — Z96611 Presence of right artificial shoulder joint: Secondary | ICD-10-CM | POA: Diagnosis not present

## 2020-01-12 DIAGNOSIS — M25611 Stiffness of right shoulder, not elsewhere classified: Secondary | ICD-10-CM | POA: Diagnosis not present

## 2020-01-13 ENCOUNTER — Other Ambulatory Visit: Payer: BC Managed Care – PPO

## 2020-01-14 ENCOUNTER — Inpatient Hospital Stay (HOSPITAL_BASED_OUTPATIENT_CLINIC_OR_DEPARTMENT_OTHER): Payer: BC Managed Care – PPO | Admitting: Hematology and Oncology

## 2020-01-14 ENCOUNTER — Inpatient Hospital Stay: Payer: BC Managed Care – PPO | Attending: Hematology and Oncology

## 2020-01-14 ENCOUNTER — Other Ambulatory Visit: Payer: Self-pay

## 2020-01-14 ENCOUNTER — Encounter: Payer: Self-pay | Admitting: Hematology and Oncology

## 2020-01-14 ENCOUNTER — Ambulatory Visit: Payer: BC Managed Care – PPO | Admitting: Hematology and Oncology

## 2020-01-14 VITALS — BP 129/88 | HR 88 | Temp 98.2°F | Resp 18 | Ht 73.0 in | Wt 262.9 lb

## 2020-01-14 DIAGNOSIS — F17200 Nicotine dependence, unspecified, uncomplicated: Secondary | ICD-10-CM

## 2020-01-14 DIAGNOSIS — J449 Chronic obstructive pulmonary disease, unspecified: Secondary | ICD-10-CM | POA: Diagnosis not present

## 2020-01-14 DIAGNOSIS — Z79899 Other long term (current) drug therapy: Secondary | ICD-10-CM | POA: Diagnosis not present

## 2020-01-14 DIAGNOSIS — I10 Essential (primary) hypertension: Secondary | ICD-10-CM | POA: Diagnosis not present

## 2020-01-14 DIAGNOSIS — C8104 Nodular lymphocyte predominant Hodgkin lymphoma, lymph nodes of axilla and upper limb: Secondary | ICD-10-CM | POA: Diagnosis not present

## 2020-01-14 DIAGNOSIS — Z5111 Encounter for antineoplastic chemotherapy: Secondary | ICD-10-CM | POA: Diagnosis not present

## 2020-01-14 DIAGNOSIS — Z791 Long term (current) use of non-steroidal anti-inflammatories (NSAID): Secondary | ICD-10-CM | POA: Diagnosis not present

## 2020-01-14 DIAGNOSIS — E042 Nontoxic multinodular goiter: Secondary | ICD-10-CM | POA: Insufficient documentation

## 2020-01-14 DIAGNOSIS — R590 Localized enlarged lymph nodes: Secondary | ICD-10-CM | POA: Diagnosis not present

## 2020-01-14 DIAGNOSIS — F17210 Nicotine dependence, cigarettes, uncomplicated: Secondary | ICD-10-CM | POA: Diagnosis not present

## 2020-01-14 DIAGNOSIS — K219 Gastro-esophageal reflux disease without esophagitis: Secondary | ICD-10-CM | POA: Insufficient documentation

## 2020-01-14 DIAGNOSIS — Z452 Encounter for adjustment and management of vascular access device: Secondary | ICD-10-CM | POA: Insufficient documentation

## 2020-01-14 DIAGNOSIS — C8174 Other classical Hodgkin lymphoma, lymph nodes of axilla and upper limb: Secondary | ICD-10-CM | POA: Insufficient documentation

## 2020-01-14 MED ORDER — ONDANSETRON HCL 8 MG PO TABS: 8.0000 mg | ORAL_TABLET | Freq: Three times a day (TID) | ORAL | 1 refills | Status: DC | PRN

## 2020-01-14 MED ORDER — PROCHLORPERAZINE MALEATE 10 MG PO TABS: 10.0000 mg | ORAL_TABLET | Freq: Four times a day (QID) | ORAL | 1 refills | Status: DC | PRN

## 2020-01-14 MED ORDER — LIDOCAINE-PRILOCAINE 2.5-2.5 % EX CREA: 1.0000 "application " | TOPICAL_CREAM | CUTANEOUS | 1 refills | Status: DC | PRN

## 2020-01-14 NOTE — Progress Notes (Signed)
[place] Telephone:([phone]) [phone]   [phone]  PROGRESS NOTE  Patient Care Team: [name], MD as PCP - General (Internal Medicine)  Hematological/Oncological History # Classic Hodgkin's Lymphoma, Stage II 1)10/10/2019: CT scan for right shoulder pain showed increased right axillary lymphadenopathy.  2) 12/10/2019: patient underwent US of the right axilla, noted to have right axillary lymphadenopathy 3) 12/15/2019: [place] axillary lymph node biopsy. Pathology confirms Classical Hodgkin's lymphoma 4) 12/24/2019: Establish care with Dr. Lorenso Courier 5)  12/31/2019: PET CT scan showed signs of right axillary adenopathy with increased metabolic activity  Interval History:  Marc Watson 64 y.o. male with medical history significant for classical Hodgkin Lymphoma who presents for a follow up visit. The patient's last visit was on 12/24/2019. In the interim since the last visit he underwent a PET CT scan which confirmed disease isolated to the axilla and he had a Port placed.  On exam today Mr. Levasseur notes that he feels well.  He is accompanied by his wife.  He notes that he has had no new symptoms in the interim since his last visit.  He reports that he does not have any issues with fevers, chills, sweats, nausea, vomiting or diarrhea.  He denies having any increase in the lymphadenopathy under his arm or discomfort there.  He does report that he is beginning to try to cut down on his smoking and attempt to quit entirely.  Additionally the rest of our visit was focused on the discussion of the logistics moving forward with chemotherapy and the treatments.  The details of this discussion is listed below.  A full 10 point ROS is listed below.  MEDICAL HISTORY:  Past Medical History:  Diagnosis Date   Arthritis    COPD (chronic obstructive pulmonary disease) ([place])    per CXR on 11/11/2019   GERD (gastroesophageal reflux disease)    occ   Hypertension    recently started  taking on 11/05/2019    SURGICAL HISTORY: Past Surgical History:  Procedure Laterality Date   IR IMAGING GUIDED PORT INSERTION  01/05/2020   JOINT REPLACEMENT  2018   left hip   KNEE SURGERY Right    cartilage   LEG SURGERY Left 1969   "stunted growth left leg"   TOTAL HIP ARTHROPLASTY Left 03/27/2017   TOTAL HIP ARTHROPLASTY Left 03/27/2017   Procedure: TOTAL HIP ARTHROPLASTY ANTERIOR APPROACH;  Surgeon: Melrose Nakayama, MD;  Location: [facility];  Service: Orthopedics;  Laterality: Left;   TOTAL SHOULDER ARTHROPLASTY Right 11/13/2019   Procedure: TOTAL SHOULDER ARTHROPLASTY;  Surgeon: Tania Ade, MD;  Location: [facility];  Service: Orthopedics;  Laterality: Right;    SOCIAL HISTORY: Social History   Socioeconomic History   Marital status: Married    Spouse name: Not on file   Number of children: Not on file   Years of education: Not on file   Highest education level: Not on file  Occupational History   Not on file  Tobacco Use   Smoking status: Current Every Day Smoker    Packs/day: 0.50    Years: 30.00    Pack years: 15.00   Smokeless tobacco: Never Used  Substance and Sexual Activity   Alcohol use: Yes    Comment: occassionally   Drug use: Yes    Types: Cocaine    Comment: last used 02/2017- none since   Sexual activity: Not on file  Other Topics Concern   Not on file  Social History Narrative   Not on  file   Social Determinants of Health   Financial Resource Strain:    Difficulty of Paying Living Expenses: Not on file  Food Insecurity:    Worried About Charity fundraiser in the Last Year: Not on file   [employer] of Food in the Last Year: Not on file  Transportation Needs:    Lack of Transportation (Medical): Not on file   Lack of Transportation (Non-Medical): Not on file  Physical Activity:    Days of Exercise per Week: Not on file   Minutes of Exercise per Session: Not on file  Stress:    Feeling of Stress : Not on file  Social  Connections:    Frequency of Communication with Friends and Family: Not on file   Frequency of Social Gatherings with Friends and Family: Not on file   Attends Religious Services: Not on file   Active Member of Clubs or Organizations: Not on file   Attends Archivist Meetings: Not on file   Marital Status: Not on file  Intimate Partner Violence:    Fear of Current or Ex-Partner: Not on file   Emotionally Abused: Not on file   Physically Abused: Not on file   Sexually Abused: Not on file    FAMILY HISTORY: No family history on file.  ALLERGIES:  is allergic to no known allergies.  MEDICATIONS:  Current Outpatient Medications  Medication Sig Dispense Refill   losartan (COZAAR) 50 MG tablet Take 50 mg by mouth daily.     naproxen (NAPROSYN) 250 MG tablet Take by mouth 2 (two) times daily with a meal. As needed     Omega-3 Fatty Acids (FISH OIL PO) Take 2 capsules by mouth daily. OMEGA XL PROPRIETARY BLEND 300 MG d-ALPHA TOCOPHEROL (VITAMIN E)/EXTRA VIRGIN OLIVE OIL/EXTRACT (pcso-524) CONTAINING OMEGA FATTY ACIDS, GREEN LIPPED MUSSEL (PERNA CANLICULUS) OIL)      oxyCODONE-acetaminophen (PERCOCET) 5-325 MG tablet Take 1-2 tablets every 4 hours as needed for post operative pain. MAX 6/day 30 tablet 0   cyclobenzaprine (FLEXERIL) 10 MG tablet Take 10 mg by mouth at bedtime as needed for muscle spasms.      lidocaine-prilocaine (EMLA) cream Apply 1 application topically as needed. 30 g 1   ondansetron (ZOFRAN) 8 MG tablet Take 1 tablet (8 mg total) by mouth every 8 (eight) hours as needed for nausea or vomiting. 30 tablet 1   prochlorperazine (COMPAZINE) 10 MG tablet Take 1 tablet (10 mg total) by mouth every 6 (six) hours as needed for nausea or vomiting. 30 tablet 1   No current facility-administered medications for this visit.    REVIEW OF SYSTEMS:   Constitutional: ( - ) fevers, ( - )  chills , ( - ) night sweats Eyes: ( - ) blurriness of vision, ( - )  double vision, ( - ) watery eyes Ears, nose, mouth, throat, and face: ( - ) mucositis, ( - ) sore throat Respiratory: ( - ) cough, ( - ) dyspnea, ( - ) wheezes Cardiovascular: ( - ) palpitation, ( - ) chest discomfort, ( - ) lower extremity swelling Gastrointestinal:  ( - ) nausea, ( - ) heartburn, ( - ) change in bowel habits Skin: ( - ) abnormal skin rashes Lymphatics: ( - ) new lymphadenopathy, ( - ) easy bruising Neurological: ( - ) numbness, ( - ) tingling, ( - ) new weaknesses Behavioral/Psych: ( - ) mood change, ( - ) new changes  All other systems were reviewed with  the patient and are negative.  PHYSICAL EXAMINATION: ECOG PERFORMANCE STATUS: 0 - Asymptomatic  Vitals:   01/14/20 1511  BP: 129/88  Pulse: 88  Resp: 18  Temp: 98.2 F (36.8 C)  SpO2: 98%   Filed Weights   01/14/20 1511  Weight: 262 lb 14.4 oz (119.3 kg)    GENERAL: well appearing middle aged Caucasian male in NAD  SKIN: skin color, texture, turgor are normal, no rashes or significant lesions EYES: conjunctiva are pink and non-injected, sclera clear LUNGS: clear to auscultation and percussion with normal breathing effort HEART: regular rate & rhythm and no murmurs and no lower extremity edema Musculoskeletal: no cyanosis of digits and no clubbing  PSYCH: alert & oriented x 3, fluent speech NEURO: no focal motor/sensory deficits  LABORATORY DATA:  I have reviewed the data as listed CBC Latest Ref Rng & Units 01/05/2020 12/24/2019 11/11/2019  WBC 4.0 - 10.5 K/uL 10.2 11.2(H) 10.0  Hemoglobin 13.0 - 17.0 g/dL 14.0 13.8 14.6  Hematocrit 39.0 - 52.0 % 42.8 42.5 45.2  Platelets 150 - 400 K/uL 354 371 292    CMP Latest Ref Rng & Units 12/24/2019 11/11/2019 03/19/2017  Glucose 70 - 99 mg/dL 108(H) 105(H) 100(H)  BUN 8 - 23 mg/dL 22 16 16   Creatinine 0.61 - 1.24 mg/dL 1.04 0.96 0.84  Sodium 135 - 145 mmol/L 144 138 140  Potassium 3.5 - 5.1 mmol/L 4.1 4.0 3.9  Chloride 98 - 111 mmol/L 111 106 109  CO2 22 - 32  mmol/L 22 25 24   Calcium 8.9 - 10.3 mg/dL 9.0 9.2 8.9  Total Protein 6.5 - 8.1 g/dL 7.1 7.0 -  Total Bilirubin 0.3 - 1.2 mg/dL <0.2(L) 0.6 -  Alkaline Phos 38 - 126 U/L 148(H) 90 -  AST 15 - 41 U/L 14(L) 18 -  ALT 0 - 44 U/L 15 23 -     RADIOGRAPHIC STUDIES: I have personally reviewed the radiological images as listed and agreed with the findings in the report: FDG avidity in the left axilla with increased lymphadenopathy in the chest, no increase in FDG.  NM PET Image Initial (PI) Skull Base To Thigh  Result Date: 12/31/2019 CLINICAL DATA:  Initial treatment strategy for Hodgkin lymphoma. EXAM: NUCLEAR MEDICINE PET SKULL BASE TO THIGH TECHNIQUE: 12.5 mCi F-18 FDG was injected intravenously. Full-ring PET imaging was performed from the skull base to thigh after the radiotracer. CT data was obtained and used for attenuation correction and anatomic localization. Fasting blood glucose: 108 mg/dl COMPARISON:  Axillary ultrasound from 12/15/2019 FINDINGS: Mediastinal blood pool activity: SUV max 2.4 Liver activity: SUV max 3.7 NECK: Increased metabolic activity in tonsillar tissue, palatine tonsils bilaterally (SUVmax = 8.4 Mildly enlarged lymph nodes in the bilateral neck with mild increased FDG avidity seen at level II 2 and III tracking into IV. Representative node at level 2 on the left (image 29, series 4) short axis dimension of 1.3 cm (SUVmax = 2.9) ) Incidental CT findings: none CHEST: (Image 81, series 4) largest right axillary lymph node measuring 1.6 cm short axis (SUVmax = 11.0 smaller nodes with similar or slightly diminished activity in the right axilla. Left axillary lymph nodes with mild enlargement, retaining fatty hila and with FDG uptake less than or equal to blood pool. Also with high AP window lymph node, the largest of mediastinal lymph nodes (image 75, series 4) 1.5 cm (SUVmax = 2.9) no additional areas of suspicion in the chest.) Incidental CT findings: Scattered calcific  atherosclerotic changes  and coronary arteries. Nodular thyroid. 2.0 cm nodule rising from left inferior thyroid. This does not show FDG uptake beyond background. ABDOMEN/PELVIS: No abnormal hypermetabolic activity within the liver, pancreas, adrenal glands, or spleen. No hypermetabolic lymph nodes in the abdomen or pelvis. Incidental CT findings: 1.6 cm left adrenal adenoma. Noncontrast appearance of liver, gallbladder, spleen, pancreas, right adrenal gland and right kidney are unremarkable. The cortical scarring involving left kidney. Small low-density lesions in the lower pole, likely cysts, also in the upper pole the left kidney. No acute bowel process.  Colonic diverticulosis.  Normal appendix. Signs of calcified atherosclerotic change of the abdominal aorta without aneurysm. Scattered small lymph nodes none with pathologic enlargement or increased FDG uptake in the retroperitoneum and upper abdomen. SKELETON: Marked degenerative changes with resultant FDG uptake about the right shoulder following right shoulder arthroplasty. Expected photopenia about a left hip arthroplasty. Incidental CT findings: Arthroplasty changes in the right shoulder and left hip. IMPRESSION: 1. Signs of right axillary adenopathy with increased metabolic activity as described. 2. Tonsillar activity in the neck that is increased, greater than twice that of background liver. Attention on follow-up. 3. Mildly enlarged lymph nodes in the neck, left axilla and mediastinum as described without increased metabolic activity, attention on follow-up. 4. Signs of nodular thyroid with 2 cm left thyroid nodule. Recommend thyroid US (ref: J Am Coll Radiol. 2015 Feb;12(2): 143-50). 5. Signs of recent right shoulder arthroplasty and prior left hip arthroplasty. 6. Left adrenal adenoma. Electronically Signed   By: Zetta Bills M.D.   On: 12/31/2019 09:27   ECHOCARDIOGRAM COMPLETE  Result Date: 12/30/2019   ECHOCARDIOGRAM REPORT   Patient Name:    ALARIC BRANDER Date of Exam: 12/30/2019 Medical Rec #:  [id number]        Height:       73.0 in Accession #:    [id number]       Weight:       254.7 lb Date of Birth:  [birth year]        BSA:          2.38 m Patient Age:    27 years         BP:           158/91 mmHg Patient Gender: M                HR:           80 bpm. Exam Location:  Outpatient Procedure: 2D Echo Indications:    chemotherapy evaluation  History:        Patient has no prior history of Echocardiogram examinations.                 COPD; Risk Factors:Hypertension and Current Smoker. Hx of                 cocaine use.  Sonographer:    Jannett Celestine RDCS (AE) Referring Phys: [id number] [name] IV  Sonographer Comments: Suboptimal parasternal window. IMPRESSIONS  1. Left ventricular ejection fraction, by visual estimation, is 65 to 70%. The left ventricle has normal function. There is no left ventricular hypertrophy.  2. The left ventricle has no regional wall motion abnormalities.  3. Global right ventricle has normal systolic function.The right ventricular size is mildly enlarged. No increase in right ventricular wall thickness.  4. Left atrial size was normal.  5. Right atrial size was normal.  6. The mitral valve is normal in structure. No evidence of mitral valve  regurgitation. No evidence of mitral stenosis.  7. The tricuspid valve is normal in structure. Tricuspid valve regurgitation is trivial.  8. The aortic valve was not well visualized. Aortic valve regurgitation is not visualized. No evidence of aortic valve sclerosis or stenosis.  9. The pulmonic valve was normal in structure. Pulmonic valve regurgitation is not visualized. 10. The inferior vena cava is normal in size with greater than 50% respiratory variability, suggesting right atrial pressure of 3 mmHg. 11. TR signal is inadequate for assessing pulmonary artery systolic pressure. 12. The average left ventricular global longitudinal strain is -20.3 %. This value may be underestimated  due to suboptimal tracking in the inferoseptal basal segment. FINDINGS  Left Ventricle: Left ventricular ejection fraction, by visual estimation, is 65 to 70%. The left ventricle has normal function. The average left ventricular global longitudinal strain is -20.3 %. The left ventricle has no regional wall motion abnormalities. There is no left ventricular hypertrophy. Left ventricular diastolic parameters were normal. Normal left atrial pressure. Right Ventricle: The right ventricular size is mildly enlarged. No increase in right ventricular wall thickness. Global RV systolic function is has normal systolic function. Left Atrium: Left atrial size was normal in size. Right Atrium: Right atrial size was normal in size Pericardium: There is no evidence of pericardial effusion. Mitral Valve: The mitral valve is normal in structure. No evidence of mitral valve regurgitation. No evidence of mitral valve stenosis by observation. Tricuspid Valve: The tricuspid valve is normal in structure. Tricuspid valve regurgitation is trivial. Aortic Valve: The aortic valve was not well visualized. Aortic valve regurgitation is not visualized. The aortic valve is structurally normal, with no evidence of sclerosis or stenosis. Pulmonic Valve: The pulmonic valve was normal in structure. Pulmonic valve regurgitation is not visualized. Pulmonic regurgitation is not visualized. Aorta: The aortic root and ascending aorta are structurally normal, with no evidence of dilitation. Venous: The inferior vena cava is normal in size with greater than 50% respiratory variability, suggesting right atrial pressure of 3 mmHg. IAS/Shunts: The interatrial septum was not well visualized.  LEFT VENTRICLE PLAX 2D LVIDd:         5.40 cm  Diastology LVIDs:         3.10 cm  LV e' lateral:   10.20 cm/s LV PW:         0.90 cm  LV E/e' lateral: 7.7 LV IVS:        0.80 cm  LV e' medial:    8.05 cm/s LVOT diam:     2.30 cm  LV E/e' medial:  9.7 LV SV:         103 ml  LV SV Index:   41.80    2D Longitudinal Strain LVOT Area:     4.15 cm 2D Strain GLS Avg:     -20.3 %  RIGHT VENTRICLE RV S prime:     15.70 cm/s TAPSE (M-mode): 2.4 cm LEFT ATRIUM             Index       RIGHT ATRIUM           Index LA diam:        3.60 cm 1.51 cm/m  RA Area:     19.40 cm LA Vol (A2C):   73.3 ml 30.74 ml/m RA Volume:   50.20 ml  21.05 ml/m LA Vol (A4C):   48.3 ml 20.26 ml/m LA Biplane Vol: 65.1 ml 27.30 ml/m  AORTIC VALVE LVOT Vmax:   106.00 cm/s  LVOT Vmean:  62.200 cm/s LVOT VTI:    0.190 m  AORTA Ao Root diam: 3.60 cm MITRAL VALVE MV Area (PHT): 2.17 cm             SHUNTS MV PHT:        101.21 msec          Systemic VTI:  0.19 m MV Decel Time: 349 msec             Systemic Diam: 2.30 cm MV E velocity: 78.30 cm/s 103 cm/s MV A velocity: 64.90 cm/s 70.3 cm/s MV E/A ratio:  1.21       1.5  Cherlynn Kaiser MD Electronically signed by Cherlynn Kaiser MD Signature Date/Time: 12/30/2019/7:56:13 PM    Final     ASSESSMENT & PLAN Marc Watson 64 y.o. male with medical history significant for classical Hodgkin Lymphoma who presents for a follow up visit.  Today our visit focused on discussing the results of the PET CT scan and the treatment plan moving forward.  The PET CT scan did show increased activity at the right axillary lymph node, with some enlargement of other lymph nodes in the chest without much in the way of FDG avidity.  As such this patient would probably be best staged at a stage II.  Today we discussed AVD chemotherapy with dropping the bleomycin given the patient's age and concern for lung function.  Additionally we discussed the full treatment plan including 2 cycles of chemotherapy and then a PET scan to determine the additional steps moving forward.  The patient is wife voiced understanding of these findings.  We will plan to have the patient start chemotherapy on 01/16/2020.  The current plan is for AVD chemotherapy x 2 cycles then reassessment. The regimen consists of  doxorubicin 25mg /m2, vinblastine 6mg /m2, and dacarbazine 375mg /m2 on Day 1 and Day 15. Bleomycin will be held on concern for his lung function and for his age. He will receive Cycle 1 Day 1 on 01/16/2020.   # Classic Hodgkin's Lymphoma, Stage II --today discussed treatment plans and logistics moving forward. The patient also had chemotherapy education today. --plan is to start AVD chemotherapy (with no bleomycin) on 01/16/2020 for two cycles. --after completion of 2 cycles will plan for a repeat PET scan to determine next course of action (duration of remaining chemo vs rads)  --patient has undergone appropriate pre-treatment testing including TTE, port placement, and blood work. PFTs were not able to be performed due to issues with scheduling during the [place] pandemic --labs to be drawn tomorrow prior to treatment. --plan for this patient's care was discussed at [facility] on 01/13/2020 --plan for RTC on 01/30/2020 prior to Cycle 1 Day 15 dose of chemotherapy.   #Thyroid Nodule --found incidentally on last PET CT scan on 12/31/2019 --will address with ultrasound once chemotherapy is underway --continue to monitor   #Symptom Management --patient provided with PRN medications for zofran and compazine for nausea once treatment starts --EMLA cream provided for port  --will continue to monitor through course of treatment   No orders of the defined types were placed in this encounter.   All questions were answered. The patient knows to call the clinic with any problems, questions or concerns.  A total of more than 45 minutes were spent on this encounter and over half of that time was spent on counseling and coordination of care as outlined above.   [name], MD Department of Hematology/Oncology [facility] at  [facility] Phone: [phone] Pager: [phone] Email: [email or web address]  01/16/2020 8:32 AM

## 2020-01-14 NOTE — Patient Instructions (Signed)
Steps to Quit Smoking Smoking tobacco is the leading cause of preventable death. It can affect almost every organ in the body. Smoking puts you and people around you at risk for many serious, long-lasting (chronic) diseases. Quitting smoking can be hard, but it is one of the best things that you can do for your health. It is never too late to quit. How do I get ready to quit? When you decide to quit smoking, make a plan to help you succeed. Before you quit:  Pick a date to quit. Set a date within the next 2 weeks to give you time to prepare.  Write down the reasons why you are quitting. Keep this list in places where you will see it often.  Tell your family, friends, and co-workers that you are quitting. Their support is important.  Talk with your doctor about the choices that may help you quit.  Find out if your health insurance will pay for these treatments.  Know the people, places, things, and activities that make you want to smoke (triggers). Avoid them. What first steps can I take to quit smoking?  Throw away all cigarettes at home, at work, and in your car.  Throw away the things that you use when you smoke, such as ashtrays and lighters.  Clean your car. Make sure to empty the ashtray.  Clean your home, including curtains and carpets. What can I do to help me quit smoking? Talk with your doctor about taking medicines and seeing a counselor at the same time. You are more likely to succeed when you do both.  If you are pregnant or breastfeeding, talk with your doctor about counseling or other ways to quit smoking. Do not take medicine to help you quit smoking unless your doctor tells you to do so. To quit smoking: Quit right away  Quit smoking totally, instead of slowly cutting back on how much you smoke over a period of time.  Go to counseling. You are more likely to quit if you go to counseling sessions regularly. Take medicine You may take medicines to help you quit. Some  medicines need a prescription, and some you can buy over-the-counter. Some medicines may contain a drug called nicotine to replace the nicotine in cigarettes. Medicines may:  Help you to stop having the desire to smoke (cravings).  Help to stop the problems that come when you stop smoking (withdrawal symptoms). Your doctor may ask you to use:  Nicotine patches, gum, or lozenges.  Nicotine inhalers or sprays.  Non-nicotine medicine that is taken by mouth. Find resources Find resources and other ways to help you quit smoking and remain smoke-free after you quit. These resources are most helpful when you use them often. They include:  Online chats with a counselor.  Phone quitlines.  Printed self-help materials.  Support groups or group counseling.  Text messaging programs.  Mobile phone apps. Use apps on your mobile phone or tablet that can help you stick to your quit plan. There are many free apps for mobile phones and tablets as well as websites. Examples include Quit Guide from the CDC and smokefree.gov  What things can I do to make it easier to quit?   Talk to your family and friends. Ask them to support and encourage you.  Call a phone quitline (1-800-QUIT-NOW), reach out to support groups, or work with a counselor.  Ask people who smoke to not smoke around you.  Avoid places that make you want to smoke,   such as: ? Bars. ? Parties. ? Smoke-break areas at work.  Spend time with people who do not smoke.  Lower the stress in your life. Stress can make you want to smoke. Try these things to help your stress: ? Getting regular exercise. ? Doing deep-breathing exercises. ? Doing yoga. ? Meditating. ? Doing a body scan. To do this, close your eyes, focus on one area of your body at a time from head to toe. Notice which parts of your body are tense. Try to relax the muscles in those areas. How will I feel when I quit smoking? Day 1 to 3 weeks Within the first 24 hours,  you may start to have some problems that come from quitting tobacco. These problems are very bad 2-3 days after you quit, but they do not often last for more than 2-3 weeks. You may get these symptoms:  Mood swings.  Feeling restless, nervous, angry, or annoyed.  Trouble concentrating.  Dizziness.  Strong desire for high-sugar foods and nicotine.  Weight gain.  Trouble pooping (constipation).  Feeling like you may vomit (nausea).  Coughing or a sore throat.  Changes in how the medicines that you take for other issues work in your body.  Depression.  Trouble sleeping (insomnia). Week 3 and afterward After the first 2-3 weeks of quitting, you may start to notice more positive results, such as:  Better sense of smell and taste.  Less coughing and sore throat.  Slower heart rate.  Lower blood pressure.  Clearer skin.  Better breathing.  Fewer sick days. Quitting smoking can be hard. Do not give up if you fail the first time. Some people need to try a few times before they succeed. Do your best to stick to your quit plan, and talk with your doctor if you have any questions or concerns. Summary  Smoking tobacco is the leading cause of preventable death. Quitting smoking can be hard, but it is one of the best things that you can do for your health.  When you decide to quit smoking, make a plan to help you succeed.  Quit smoking right away, not slowly over a period of time.  When you start quitting, seek help from your doctor, family, or friends. This information is not intended to replace advice given to you by your health care provider. Make sure you discuss any questions you have with your health care provider. Document Revised: 08/22/2019 Document Reviewed: 02/15/2019 [employer] Patient Education  2020 [employer]  

## 2020-01-16 ENCOUNTER — Other Ambulatory Visit: Payer: Self-pay | Admitting: Hematology and Oncology

## 2020-01-16 ENCOUNTER — Inpatient Hospital Stay: Payer: BC Managed Care – PPO

## 2020-01-16 ENCOUNTER — Encounter: Payer: Self-pay | Admitting: Hematology and Oncology

## 2020-01-16 ENCOUNTER — Other Ambulatory Visit: Payer: Self-pay

## 2020-01-16 VITALS — BP 166/85 | HR 70 | Temp 98.3°F | Resp 18

## 2020-01-16 DIAGNOSIS — C8104 Nodular lymphocyte predominant Hodgkin lymphoma, lymph nodes of axilla and upper limb: Secondary | ICD-10-CM

## 2020-01-16 DIAGNOSIS — Z5111 Encounter for antineoplastic chemotherapy: Secondary | ICD-10-CM | POA: Diagnosis not present

## 2020-01-16 DIAGNOSIS — Z452 Encounter for adjustment and management of vascular access device: Secondary | ICD-10-CM | POA: Diagnosis not present

## 2020-01-16 DIAGNOSIS — E042 Nontoxic multinodular goiter: Secondary | ICD-10-CM | POA: Diagnosis not present

## 2020-01-16 DIAGNOSIS — J449 Chronic obstructive pulmonary disease, unspecified: Secondary | ICD-10-CM | POA: Diagnosis not present

## 2020-01-16 DIAGNOSIS — K219 Gastro-esophageal reflux disease without esophagitis: Secondary | ICD-10-CM | POA: Diagnosis not present

## 2020-01-16 DIAGNOSIS — Z95828 Presence of other vascular implants and grafts: Secondary | ICD-10-CM

## 2020-01-16 DIAGNOSIS — R590 Localized enlarged lymph nodes: Secondary | ICD-10-CM | POA: Diagnosis not present

## 2020-01-16 DIAGNOSIS — C8174 Other classical Hodgkin lymphoma, lymph nodes of axilla and upper limb: Secondary | ICD-10-CM | POA: Diagnosis not present

## 2020-01-16 DIAGNOSIS — I10 Essential (primary) hypertension: Secondary | ICD-10-CM | POA: Diagnosis not present

## 2020-01-16 DIAGNOSIS — F17210 Nicotine dependence, cigarettes, uncomplicated: Secondary | ICD-10-CM | POA: Diagnosis not present

## 2020-01-16 DIAGNOSIS — Z791 Long term (current) use of non-steroidal anti-inflammatories (NSAID): Secondary | ICD-10-CM | POA: Diagnosis not present

## 2020-01-16 DIAGNOSIS — Z79899 Other long term (current) drug therapy: Secondary | ICD-10-CM | POA: Diagnosis not present

## 2020-01-16 LAB — CBC WITH DIFFERENTIAL (CANCER CENTER ONLY)
Abs Immature Granulocytes: 0.02 10*3/uL (ref 0.00–0.07)
Basophils Absolute: 0.1 10*3/uL (ref 0.0–0.1)
Basophils Relative: 1 %
Eosinophils Absolute: 0.3 10*3/uL (ref 0.0–0.5)
Eosinophils Relative: 3 %
HCT: 41.2 % (ref 39.0–52.0)
Hemoglobin: 13.4 g/dL (ref 13.0–17.0)
Immature Granulocytes: 0 %
Lymphocytes Relative: 31 %
Lymphs Abs: 3 10*3/uL (ref 0.7–4.0)
MCH: 28.2 pg (ref 26.0–34.0)
MCHC: 32.5 g/dL (ref 30.0–36.0)
MCV: 86.7 fL (ref 80.0–100.0)
Monocytes Absolute: 1.1 10*3/uL — ABNORMAL HIGH (ref 0.1–1.0)
Monocytes Relative: 12 %
Neutro Abs: 4.9 10*3/uL (ref 1.7–7.7)
Neutrophils Relative %: 53 %
Platelet Count: 285 10*3/uL (ref 150–400)
RBC: 4.75 MIL/uL (ref 4.22–5.81)
RDW: 13.6 % (ref 11.5–15.5)
WBC Count: 9.5 10*3/uL (ref 4.0–10.5)
nRBC: 0 % (ref 0.0–0.2)

## 2020-01-16 LAB — CMP (CANCER CENTER ONLY)
ALT: 47 U/L — ABNORMAL HIGH (ref 0–44)
AST: 27 U/L (ref 15–41)
Albumin: 3.8 g/dL (ref 3.5–5.0)
Alkaline Phosphatase: 116 U/L (ref 38–126)
Anion gap: 7 (ref 5–15)
BUN: 17 mg/dL (ref 8–23)
CO2: 27 mmol/L (ref 22–32)
Calcium: 9.4 mg/dL (ref 8.9–10.3)
Chloride: 107 mmol/L (ref 98–111)
Creatinine: 0.82 mg/dL (ref 0.61–1.24)
GFR, Est AFR Am: >60 mL/min (ref >60)
GFR, Estimated: >60 mL/min (ref >60)
Glucose, Bld: 106 mg/dL — ABNORMAL HIGH (ref 70–99)
Potassium: 4 mmol/L (ref 3.5–5.1)
Sodium: 141 mmol/L (ref 135–145)
Total Bilirubin: 0.2 mg/dL — ABNORMAL LOW (ref 0.3–1.2)
Total Protein: 6.8 g/dL (ref 6.5–8.1)

## 2020-01-16 LAB — LACTATE DEHYDROGENASE: LDH: 159 U/L (ref 98–192)

## 2020-01-16 MED ORDER — HEPARIN SOD (PORK) LOCK FLUSH 100 UNIT/ML IV SOLN
500.0000 [IU] | Freq: Once | INTRAVENOUS | Status: AC | PRN
  Administered 2020-01-16: 500 [IU]
  Filled 2020-01-16: qty 5

## 2020-01-16 MED ORDER — PALONOSETRON HCL INJECTION 0.25 MG/5ML
0.2500 mg | Freq: Once | INTRAVENOUS | Status: AC
  Administered 2020-01-16: 10:00:00 0.25 mg via INTRAVENOUS

## 2020-01-16 MED ORDER — SODIUM CHLORIDE 0.9 % IV SOLN
375.0000 mg/m2 | Freq: Once | INTRAVENOUS | Status: AC
  Administered 2020-01-16: 920 mg via INTRAVENOUS
  Filled 2020-01-16: qty 92

## 2020-01-16 MED ORDER — DEXAMETHASONE SODIUM PHOSPHATE 10 MG/ML IJ SOLN
INTRAMUSCULAR | Status: AC
  Filled 2020-01-16: qty 1

## 2020-01-16 MED ORDER — DEXAMETHASONE SODIUM PHOSPHATE 10 MG/ML IJ SOLN
10.0000 mg | Freq: Once | INTRAMUSCULAR | Status: AC
  Administered 2020-01-16: 10:00:00 10 mg via INTRAVENOUS

## 2020-01-16 MED ORDER — DOXORUBICIN HCL CHEMO IV INJECTION 2 MG/ML
25.0000 mg/m2 | Freq: Once | INTRAVENOUS | Status: AC
  Administered 2020-01-16: 12:00:00 62 mg via INTRAVENOUS
  Filled 2020-01-16: qty 31

## 2020-01-16 MED ORDER — SODIUM CHLORIDE 0.9 % IV SOLN
Freq: Once | INTRAVENOUS | Status: AC
  Filled 2020-01-16: qty 250

## 2020-01-16 MED ORDER — SODIUM CHLORIDE 0.9 % IV SOLN
150.0000 mg | Freq: Once | INTRAVENOUS | Status: AC
  Administered 2020-01-16: 150 mg via INTRAVENOUS
  Filled 2020-01-16: qty 5

## 2020-01-16 MED ORDER — VINBLASTINE SULFATE CHEMO INJECTION 1 MG/ML
6.0000 mg/m2 | Freq: Once | INTRAVENOUS | Status: AC
  Administered 2020-01-16: 14.6 mg via INTRAVENOUS
  Filled 2020-01-16: qty 14.6

## 2020-01-16 MED ORDER — SODIUM CHLORIDE 0.9% FLUSH
10.0000 mL | INTRAVENOUS | Status: DC | PRN
  Administered 2020-01-16: 10 mL
  Filled 2020-01-16: qty 10

## 2020-01-16 MED ORDER — PALONOSETRON HCL INJECTION 0.25 MG/5ML
INTRAVENOUS | Status: AC
  Filled 2020-01-16: qty 5

## 2020-01-16 MED ORDER — SODIUM CHLORIDE 0.9% FLUSH
10.0000 mL | INTRAVENOUS | Status: DC | PRN
  Administered 2020-01-16: 14:00:00 10 mL
  Filled 2020-01-16: qty 10

## 2020-01-16 NOTE — Patient Instructions (Signed)
[facility] Discharge Instructions for Patients Receiving Chemotherapy  Today you received the following chemotherapy agents Adriamycin, Vinblastine, Dacarbazine  To help prevent nausea and vomiting after your treatment, we encourage you to take your nausea medication as prescribed.   If you develop nausea and vomiting that is not controlled by your nausea medication, call the clinic.   BELOW ARE SYMPTOMS THAT SHOULD BE REPORTED IMMEDIATELY:  *FEVER GREATER THAN 100.5 F  *CHILLS WITH OR WITHOUT FEVER  NAUSEA AND VOMITING THAT IS NOT CONTROLLED WITH YOUR NAUSEA MEDICATION  *UNUSUAL SHORTNESS OF BREATH  *UNUSUAL BRUISING OR BLEEDING  TENDERNESS IN MOUTH AND THROAT WITH OR WITHOUT PRESENCE OF ULCERS  *URINARY PROBLEMS  *BOWEL PROBLEMS  UNUSUAL RASH Items with * indicate a potential emergency and should be followed up as soon as possible.  Feel free to call the clinic should you have any questions or concerns. The clinic phone number is [phone].  Please show the [facility] at check-in to the Emergency Department and triage nurse.  Doxorubicin injection What is this medicine? DOXORUBICIN (dox oh ROO bi sin) is a chemotherapy drug. It is used to treat many kinds of cancer like leukemia, lymphoma, neuroblastoma, sarcoma, and Wilms' tumor. It is also used to treat bladder cancer, breast cancer, lung cancer, ovarian cancer, stomach cancer, and thyroid cancer. This medicine may be used for other purposes; ask your health care provider or pharmacist if you have questions. COMMON BRAND NAME(S): Adriamycin, Adriamycin PFS, Adriamycin RDF, Rubex What should I tell my health care provider before I take this medicine? They need to know if you have any of these conditions:  heart disease  history of low blood counts caused by a medicine  liver disease  recent or ongoing radiation therapy  an unusual or allergic reaction to doxorubicin, other chemotherapy  agents, other medicines, foods, dyes, or preservatives  pregnant or trying to get pregnant  breast-feeding How should I use this medicine? This drug is given as an infusion into a vein. It is administered in a hospital or clinic by a specially trained health care professional. If you have pain, swelling, burning or any unusual feeling around the site of your injection, tell your health care professional right away. Talk to your pediatrician regarding the use of this medicine in children. Special care may be needed. Overdosage: If you think you have taken too much of this medicine contact a poison control center or emergency room at once. NOTE: This medicine is only for you. Do not share this medicine with others. What if I miss a dose? It is important not to miss your dose. Call your doctor or health care professional if you are unable to keep an appointment. What may interact with this medicine? This medicine may interact with the following medications:  6-mercaptopurine  paclitaxel  phenytoin  St. John's Wort  trastuzumab  verapamil This list may not describe all possible interactions. Give your health care provider a list of all the medicines, herbs, non-prescription drugs, or dietary supplements you use. Also tell them if you smoke, drink alcohol, or use illegal drugs. Some items may interact with your medicine. What should I watch for while using this medicine? This drug may make you feel generally unwell. This is not uncommon, as chemotherapy can affect healthy cells as well as cancer cells. Report any side effects. Continue your course of treatment even though you feel ill unless your doctor tells you to stop. There is a maximum amount  of this medicine you should receive throughout your life. The amount depends on the medical condition being treated and your overall health. Your doctor will watch how much of this medicine you receive in your lifetime. Tell your doctor if you have  taken this medicine before. You may need blood work done while you are taking this medicine. Your urine may turn red for a few days after your dose. This is not blood. If your urine is dark or brown, call your doctor. In some cases, you may be given additional medicines to help with side effects. Follow all directions for their use. Call your doctor or health care professional for advice if you get a fever, chills or sore throat, or other symptoms of a cold or flu. Do not treat yourself. This drug decreases your body's ability to fight infections. Try to avoid being around people who are sick. This medicine may increase your risk to bruise or bleed. Call your doctor or health care professional if you notice any unusual bleeding. Talk to your doctor about your risk of cancer. You may be more at risk for certain types of cancers if you take this medicine. Do not become pregnant while taking this medicine or for 6 months after stopping it. Women should inform their doctor if they wish to become pregnant or think they might be pregnant. Men should not father a child while taking this medicine and for 6 months after stopping it. There is a potential for serious side effects to an unborn child. Talk to your health care professional or pharmacist for more information. Do not breast-feed an infant while taking this medicine. This medicine has caused ovarian failure in some women and reduced sperm counts in some men This medicine may interfere with the ability to have a child. Talk with your doctor or health care professional if you are concerned about your fertility. This medicine may cause a decrease in Co-Enzyme Q-10. You should make sure that you get enough Co-Enzyme Q-10 while you are taking this medicine. Discuss the foods you eat and the vitamins you take with your health care professional. What side effects may I notice from receiving this medicine? Side effects that you should report to your doctor or  health care professional as soon as possible:  allergic reactions like skin rash, itching or hives, swelling of the face, lips, or tongue  breathing problems  chest pain  fast or irregular heartbeat  low blood counts - this medicine may decrease the number of white blood cells, red blood cells and platelets. You may be at increased risk for infections and bleeding.  pain, redness, or irritation at site where injected  signs of infection - fever or chills, cough, sore throat, pain or difficulty passing urine  signs of decreased platelets or bleeding - bruising, pinpoint red spots on the skin, black, tarry stools, blood in the urine  swelling of the ankles, feet, hands  tiredness  weakness Side effects that usually do not require medical attention (report to your doctor or health care professional if they continue or are bothersome):  diarrhea  hair loss  mouth sores  nail discoloration or damage  nausea  red colored urine  vomiting This list may not describe all possible side effects. Call your doctor for medical advice about side effects. You may report side effects to FDA at 1-800-FDA-1088. Where should I keep my medicine? This drug is given in a hospital or clinic and will not be stored at home.  NOTE: This sheet is a summary. It may not cover all possible information. If you have questions about this medicine, talk to your doctor, pharmacist, or health care provider.  2020 Elsevier/Gold Standard (2017-07-11 11:01:26)  Vinblastine injection What is this medicine? VINBLASTINE (vin BLAS teen) is a chemotherapy drug. It slows the growth of cancer cells. This medicine is used to treat many types of cancer like breast cancer, testicular cancer, Hodgkin's disease, non-Hodgkin's lymphoma, and sarcoma. This medicine may be used for other purposes; ask your health care provider or pharmacist if you have questions. COMMON BRAND NAME(S): Velban What should I tell my health  care provider before I take this medicine? They need to know if you have any of these conditions:  blood disorders  dental disease  gout  infection (especially a virus infection such as chickenpox, cold sores, or herpes)  liver disease  lung disease  nervous system disease  recent or ongoing radiation therapy  an unusual or allergic reaction to vinblastine, other chemotherapy agents, other medicines, foods, dyes, or preservatives  pregnant or trying to get pregnant  breast-feeding How should I use this medicine? This drug is given as an infusion into a vein. It is administered in a hospital or clinic by a specially trained health care professional. If you have pain, swelling, burning or any unusual feeling around the site of your injection, tell your health care professional right away. Talk to your pediatrician regarding the use of this medicine in children. While this drug may be prescribed for selected conditions, precautions do apply. Overdosage: If you think you have taken too much of this medicine contact a poison control center or emergency room at once. NOTE: This medicine is only for you. Do not share this medicine with others. What if I miss a dose? It is important not to miss your dose. Call your doctor or health care professional if you are unable to keep an appointment. What may interact with this medicine? Do not take this medicine with any of the following medications:  erythromycin  itraconazole  mibefradil  voriconazole This medicine may also interact with the following medications:  cyclosporine  fluconazole  ketoconazole  medicines for seizures like phenytoin  medicines to increase blood counts like filgrastim, pegfilgrastim, sargramostim  vaccines  verapamil Talk to your doctor or health care professional before taking any of these medicines:  acetaminophen  aspirin  ibuprofen  ketoprofen  naproxen This list may not describe all  possible interactions. Give your health care provider a list of all the medicines, herbs, non-prescription drugs, or dietary supplements you use. Also tell them if you smoke, drink alcohol, or use illegal drugs. Some items may interact with your medicine. What should I watch for while using this medicine? Your condition will be monitored carefully while you are receiving this medicine. You will need important blood work done while you are taking this medicine. This drug may make you feel generally unwell. This is not uncommon, as chemotherapy can affect healthy cells as well as cancer cells. Report any side effects. Continue your course of treatment even though you feel ill unless your doctor tells you to stop. In some cases, you may be given additional medicines to help with side effects. Follow all directions for their use. Call your doctor or health care professional for advice if you get a fever, chills or sore throat, or other symptoms of a cold or flu. Do not treat yourself. This drug decreases your body's ability to fight infections.  Try to avoid being around people who are sick. This medicine may increase your risk to bruise or bleed. Call your doctor or health care professional if you notice any unusual bleeding. Be careful brushing and flossing your teeth or using a toothpick because you may get an infection or bleed more easily. If you have any dental work done, tell your dentist you are receiving this medicine. Avoid taking products that contain aspirin, acetaminophen, ibuprofen, naproxen, or ketoprofen unless instructed by your doctor. These medicines may hide a fever. Do not become pregnant while taking this medicine. Women should inform their doctor if they wish to become pregnant or think they might be pregnant. There is a potential for serious side effects to an unborn child. Talk to your health care professional or pharmacist for more information. Do not breast-feed an infant while taking  this medicine. Men may have a lower sperm count while taking this medicine. Talk to your doctor if you plan to father a child. What side effects may I notice from receiving this medicine? Side effects that you should report to your doctor or health care professional as soon as possible:  allergic reactions like skin rash, itching or hives, swelling of the face, lips, or tongue  low blood counts - This drug may decrease the number of white blood cells, red blood cells and platelets. You may be at increased risk for infections and bleeding.  signs of infection - fever or chills, cough, sore throat, pain or difficulty passing urine  signs of decreased platelets or bleeding - bruising, pinpoint red spots on the skin, black, tarry stools, nosebleeds  signs of decreased red blood cells - unusually weak or tired, fainting spells, lightheadedness  breathing problems  changes in hearing  change in the amount of urine  chest pain  high blood pressure  mouth sores  nausea and vomiting  pain, swelling, redness or irritation at the injection site  pain, tingling, numbness in the hands or feet  problems with balance, dizziness  seizures Side effects that usually do not require medical attention (report to your doctor or health care professional if they continue or are bothersome):  constipation  hair loss  jaw pain  loss of appetite  sensitivity to light  stomach pain  tumor pain This list may not describe all possible side effects. Call your doctor for medical advice about side effects. You may report side effects to FDA at 1-800-FDA-1088. Where should I keep my medicine? This drug is given in a hospital or clinic and will not be stored at home. NOTE: This sheet is a summary. It may not cover all possible information. If you have questions about this medicine, talk to your doctor, pharmacist, or health care provider.  2020 Elsevier/Gold Standard (2008-08-24  17:15:59)  Dacarbazine, DTIC injection What is this medicine? DACARBAZINE (da KAR ba zeen) is a chemotherapy drug. This medicine is used to treat skin cancer. It is also used with other medicines to treat Hodgkin's disease. This medicine may be used for other purposes; ask your health care provider or pharmacist if you have questions. COMMON BRAND NAME(S): DTIC-Dome What should I tell my health care provider before I take this medicine? They need to know if you have any of these conditions:  infection (especially virus infection such as chickenpox, cold sores, or herpes)  kidney disease  liver disease  low blood counts like low platelets, red blood cells, white blood cells  recent radiation therapy  an unusual or allergic  reaction to dacarbazine, other chemotherapy agents, other medicines, foods, dyes, or preservatives  pregnant or trying to get pregnant  breast-feeding How should I use this medicine? This drug is given as an injection or infusion into a vein. It is administered in a hospital or clinic by a specially trained health care professional. Talk to your pediatrician regarding the use of this medicine in children. While this drug may be prescribed for selected conditions, precautions do apply. Overdosage: If you think you have taken too much of this medicine contact a poison control center or emergency room at once. NOTE: This medicine is only for you. Do not share this medicine with others. What if I miss a dose? It is important not to miss your dose. Call your doctor or health care professional if you are unable to keep an appointment. What may interact with this medicine?  medicines to increase blood counts like filgrastim, pegfilgrastim, sargramostim  vaccines This list may not describe all possible interactions. Give your health care provider a list of all the medicines, herbs, non-prescription drugs, or dietary supplements you use. Also tell them if you smoke,  drink alcohol, or use illegal drugs. Some items may interact with your medicine. What should I watch for while using this medicine? Your condition will be monitored carefully while you are receiving this medicine. You will need important blood work done while you are taking this medicine. This drug may make you feel generally unwell. This is not uncommon, as chemotherapy can affect healthy cells as well as cancer cells. Report any side effects. Continue your course of treatment even though you feel ill unless your doctor tells you to stop. Call your doctor or health care professional for advice if you get a fever, chills or sore throat, or other symptoms of a cold or flu. Do not treat yourself. This drug decreases your body's ability to fight infections. Try to avoid being around people who are sick. This medicine may increase your risk to bruise or bleed. Call your doctor or health care professional if you notice any unusual bleeding. Talk to your doctor about your risk of cancer. You may be more at risk for certain types of cancers if you take this medicine. Do not become pregnant while taking this medicine. Women should inform their doctor if they wish to become pregnant or think they might be pregnant. There is a potential for serious side effects to an unborn child. Talk to your health care professional or pharmacist for more information. Do not breast-feed an infant while taking this medicine. What side effects may I notice from receiving this medicine? Side effects that you should report to your doctor or health care professional as soon as possible:  allergic reactions like skin rash, itching or hives, swelling of the face, lips, or tongue  low blood counts - this medicine may decrease the number of white blood cells, red blood cells and platelets. You may be at increased risk for infections and bleeding.  signs of infection - fever or chills, cough, sore throat, pain or difficulty passing  urine  signs of decreased platelets or bleeding - bruising, pinpoint red spots on the skin, black, tarry stools, blood in the urine  signs of decreased red blood cells - unusually weak or tired, fainting spells, lightheadedness  breathing problems  muscle pains  pain at site where injected  trouble passing urine or change in the amount of urine  vomiting  yellowing of the eyes or skin Side effects  that usually do not require medical attention (report to your doctor or health care professional if they continue or are bothersome):  diarrhea  hair loss  loss of appetite  nausea  skin more sensitive to sun or ultraviolet light  stomach upset This list may not describe all possible side effects. Call your doctor for medical advice about side effects. You may report side effects to FDA at 1-800-FDA-1088. Where should I keep my medicine? This drug is given in a hospital or clinic and will not be stored at home. NOTE: This sheet is a summary. It may not cover all possible information. If you have questions about this medicine, talk to your doctor, pharmacist, or health care provider.  2020 Elsevier/Gold Standard (2016-01-28 15:17:39)  Coronavirus (COVID-19) Are you at risk?  Are you at risk for the Coronavirus (COVID-19)?  To be considered HIGH RISK for Coronavirus (COVID-19), you have to meet the following criteria:  . Traveled to [place], [place], [place], [place] or [place]; or in the [place] to [place], [place], [place], or [place]; and have fever, cough, and shortness of breath within the last 2 weeks of travel OR . Been in close contact with a person diagnosed with COVID-19 within the last 2 weeks and have fever, cough, and shortness of breath . IF YOU DO NOT MEET THESE CRITERIA, YOU ARE CONSIDERED LOW RISK FOR COVID-19.  What to do if you are HIGH RISK for COVID-19?  Marland Kitchen If you are having a medical emergency, call 911. . Seek medical care right away. Before  you go to a doctor's office, urgent care or emergency department, call ahead and tell them about your recent travel, contact with someone diagnosed with COVID-19, and your symptoms. You should receive instructions from your physician's office regarding next steps of care.  . When you arrive at healthcare provider, tell the healthcare staff immediately you have returned from visiting [place], [place], [place], [place] or [place]; or traveled in the [place] to [place], [place], [place], or [place]; in the last two weeks or you have been in close contact with a person diagnosed with COVID-19 in the last 2 weeks.   . Tell the health care staff about your symptoms: fever, cough and shortness of breath. . After you have been seen by a medical provider, you will be either: o Tested for (COVID-19) and discharged home on quarantine except to seek medical care if symptoms worsen, and asked to  - Stay home and avoid contact with others until you get your results (4-5 days)  - Avoid travel on public transportation if possible (such as bus, train, or airplane) or o Sent to the Emergency Department by EMS for evaluation, COVID-19 testing, and possible admission depending on your condition and test results.  What to do if you are LOW RISK for COVID-19?  Reduce your risk of any infection by using the same precautions used for avoiding the common cold or flu:  Marland Kitchen Wash your hands often with soap and warm water for at least 20 seconds.  If soap and water are not readily available, use an alcohol-based hand sanitizer with at least 60% alcohol.  . If coughing or sneezing, cover your mouth and nose by coughing or sneezing into the elbow areas of your shirt or coat, into a tissue or into your sleeve (not your hands). . Avoid shaking hands with others and consider head nods or verbal greetings only. . Avoid touching your eyes, nose,  or mouth with unwashed hands.  . Avoid close contact with people who are  sick. . Avoid places or events with large numbers of people in one location, like concerts or sporting events. . Carefully consider travel plans you have or are making. . If you are planning any travel outside or inside the [place], visit the CDC's Travelers' Health webpage for the latest health notices. . If you have some symptoms but not all symptoms, continue to monitor at home and seek medical attention if your symptoms worsen. . If you are having a medical emergency, call 911.   [place] / e-Visit: [email or web address]         [facility]: [facility]: [id number]                   [facility]: [phone]

## 2020-01-16 NOTE — Progress Notes (Signed)
Met with patient in lobby to introduce myself as Arboriculturist and to offer available resources.  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 Patient states he is ok.  Gave him my card for any additional financial questions or concerns.

## 2020-01-19 ENCOUNTER — Telehealth: Payer: Self-pay | Admitting: *Deleted

## 2020-01-19 ENCOUNTER — Telehealth: Payer: Self-pay | Admitting: Hematology and Oncology

## 2020-01-19 ENCOUNTER — Other Ambulatory Visit: Payer: Self-pay | Admitting: *Deleted

## 2020-01-19 DIAGNOSIS — C8174 Other classical Hodgkin lymphoma, lymph nodes of axilla and upper limb: Secondary | ICD-10-CM

## 2020-01-19 NOTE — Telephone Encounter (Signed)
Received message from weekend on call nurse team stating that patient called in regarding itching. Pt was advised to take Benadryl from on call MD. TCT patient to follow up with him today.  No answer. VM message left for him on identified phone for him to call back. Received call back from patient. He states the itching has resolved and 1 dose of benadryl.  He did have some nausea this am and took Zofran. He c/o headache after this. Advised that headache is a frequent side effect of the zofran. Advised that he should use compazine for nausea instead d/t headaches.  He also asked about lab appt for this week. He states that Dr. Lorenso Courier told him he would get labs in-between chemo therapy appts.  Spoke with Dr. Lorenso Courier and he is good with patient getting labs this friday . High priority scheduling message sent for lab appt and lab orders placed for CBC, CMP and LDH.  Pt aware of lab appt. No further questions or concerns.

## 2020-01-19 NOTE — Telephone Encounter (Signed)
Called pt to see how he did with his treatment last week.  Left message for pt to call back.

## 2020-01-19 NOTE — Telephone Encounter (Signed)
Scheduled appt per 2/8 sch message - pt is aware of apt date and time

## 2020-01-19 NOTE — Telephone Encounter (Signed)
-----   Message from Rolene Course, RN sent at 01/16/2020 11:26 AM EST ----- Regarding: Marc Watson 1st Tx F/U call - Adriamycin, Vinblastine, DTIC Marc Watson 1st Tx F/U call - Adriamycin, Vinblastine, DTIC

## 2020-01-23 ENCOUNTER — Other Ambulatory Visit: Payer: Self-pay

## 2020-01-23 ENCOUNTER — Inpatient Hospital Stay: Payer: BC Managed Care – PPO

## 2020-01-23 DIAGNOSIS — F17210 Nicotine dependence, cigarettes, uncomplicated: Secondary | ICD-10-CM | POA: Diagnosis not present

## 2020-01-23 DIAGNOSIS — Z95828 Presence of other vascular implants and grafts: Secondary | ICD-10-CM

## 2020-01-23 DIAGNOSIS — R590 Localized enlarged lymph nodes: Secondary | ICD-10-CM | POA: Diagnosis not present

## 2020-01-23 DIAGNOSIS — Z791 Long term (current) use of non-steroidal anti-inflammatories (NSAID): Secondary | ICD-10-CM | POA: Diagnosis not present

## 2020-01-23 DIAGNOSIS — J449 Chronic obstructive pulmonary disease, unspecified: Secondary | ICD-10-CM | POA: Diagnosis not present

## 2020-01-23 DIAGNOSIS — I10 Essential (primary) hypertension: Secondary | ICD-10-CM | POA: Diagnosis not present

## 2020-01-23 DIAGNOSIS — K219 Gastro-esophageal reflux disease without esophagitis: Secondary | ICD-10-CM | POA: Diagnosis not present

## 2020-01-23 DIAGNOSIS — C8174 Other classical Hodgkin lymphoma, lymph nodes of axilla and upper limb: Secondary | ICD-10-CM | POA: Diagnosis not present

## 2020-01-23 DIAGNOSIS — Z452 Encounter for adjustment and management of vascular access device: Secondary | ICD-10-CM | POA: Diagnosis not present

## 2020-01-23 DIAGNOSIS — Z79899 Other long term (current) drug therapy: Secondary | ICD-10-CM | POA: Diagnosis not present

## 2020-01-23 DIAGNOSIS — Z5111 Encounter for antineoplastic chemotherapy: Secondary | ICD-10-CM | POA: Diagnosis not present

## 2020-01-23 DIAGNOSIS — E042 Nontoxic multinodular goiter: Secondary | ICD-10-CM | POA: Diagnosis not present

## 2020-01-23 LAB — CBC WITH DIFFERENTIAL (CANCER CENTER ONLY)
Abs Immature Granulocytes: 0.06 10*3/uL (ref 0.00–0.07)
Basophils Absolute: 0.1 10*3/uL (ref 0.0–0.1)
Basophils Relative: 1 %
Eosinophils Absolute: 0.3 10*3/uL (ref 0.0–0.5)
Eosinophils Relative: 4 %
HCT: 39.1 % (ref 39.0–52.0)
Hemoglobin: 12.8 g/dL — ABNORMAL LOW (ref 13.0–17.0)
Immature Granulocytes: 1 %
Lymphocytes Relative: 35 %
Lymphs Abs: 2.8 10*3/uL (ref 0.7–4.0)
MCH: 29 pg (ref 26.0–34.0)
MCHC: 32.7 g/dL (ref 30.0–36.0)
MCV: 88.5 fL (ref 80.0–100.0)
Monocytes Absolute: 0.4 10*3/uL (ref 0.1–1.0)
Monocytes Relative: 4 %
Neutro Abs: 4.4 10*3/uL (ref 1.7–7.7)
Neutrophils Relative %: 55 %
Platelet Count: 303 10*3/uL (ref 150–400)
RBC: 4.42 MIL/uL (ref 4.22–5.81)
RDW: 13.6 % (ref 11.5–15.5)
WBC Count: 7.9 10*3/uL (ref 4.0–10.5)
nRBC: 0 % (ref 0.0–0.2)

## 2020-01-23 LAB — CMP (CANCER CENTER ONLY)
ALT: 34 U/L (ref 0–44)
AST: 19 U/L (ref 15–41)
Albumin: 3.7 g/dL (ref 3.5–5.0)
Alkaline Phosphatase: 116 U/L (ref 38–126)
Anion gap: 9 (ref 5–15)
BUN: 19 mg/dL (ref 8–23)
CO2: 26 mmol/L (ref 22–32)
Calcium: 9.4 mg/dL (ref 8.9–10.3)
Chloride: 106 mmol/L (ref 98–111)
Creatinine: 0.83 mg/dL (ref 0.61–1.24)
GFR, Est AFR Am: >60 mL/min (ref >60)
GFR, Estimated: >60 mL/min (ref >60)
Glucose, Bld: 119 mg/dL — ABNORMAL HIGH (ref 70–99)
Potassium: 4.1 mmol/L (ref 3.5–5.1)
Sodium: 141 mmol/L (ref 135–145)
Total Bilirubin: <0.2 mg/dL — ABNORMAL LOW (ref 0.3–1.2)
Total Protein: 6.7 g/dL (ref 6.5–8.1)

## 2020-01-23 LAB — LACTATE DEHYDROGENASE: LDH: 128 U/L (ref 98–192)

## 2020-01-23 MED ORDER — HEPARIN SOD (PORK) LOCK FLUSH 100 UNIT/ML IV SOLN
500.0000 [IU] | Freq: Once | INTRAVENOUS | Status: AC | PRN
  Administered 2020-01-23: 12:00:00 500 [IU]
  Filled 2020-01-23: qty 5

## 2020-01-23 MED ORDER — SODIUM CHLORIDE 0.9% FLUSH
10.0000 mL | INTRAVENOUS | Status: DC | PRN
  Administered 2020-01-23: 12:00:00 10 mL
  Filled 2020-01-23: qty 10

## 2020-01-23 NOTE — Patient Instructions (Signed)

## 2020-01-28 ENCOUNTER — Other Ambulatory Visit: Payer: Self-pay | Admitting: *Deleted

## 2020-01-28 DIAGNOSIS — C8104 Nodular lymphocyte predominant Hodgkin lymphoma, lymph nodes of axilla and upper limb: Secondary | ICD-10-CM

## 2020-01-29 ENCOUNTER — Other Ambulatory Visit: Payer: BC Managed Care – PPO

## 2020-01-29 ENCOUNTER — Inpatient Hospital Stay: Payer: BC Managed Care – PPO

## 2020-01-29 ENCOUNTER — Encounter: Payer: Self-pay | Admitting: Hematology and Oncology

## 2020-01-29 ENCOUNTER — Other Ambulatory Visit: Payer: Self-pay

## 2020-01-29 ENCOUNTER — Other Ambulatory Visit: Payer: Self-pay | Admitting: Hematology and Oncology

## 2020-01-29 ENCOUNTER — Inpatient Hospital Stay (HOSPITAL_BASED_OUTPATIENT_CLINIC_OR_DEPARTMENT_OTHER): Payer: BC Managed Care – PPO | Admitting: Hematology and Oncology

## 2020-01-29 VITALS — BP 150/89 | HR 76 | Temp 98.3°F | Resp 20 | Wt 268.4 lb

## 2020-01-29 DIAGNOSIS — C8104 Nodular lymphocyte predominant Hodgkin lymphoma, lymph nodes of axilla and upper limb: Secondary | ICD-10-CM

## 2020-01-29 DIAGNOSIS — J449 Chronic obstructive pulmonary disease, unspecified: Secondary | ICD-10-CM | POA: Diagnosis not present

## 2020-01-29 DIAGNOSIS — E042 Nontoxic multinodular goiter: Secondary | ICD-10-CM | POA: Diagnosis not present

## 2020-01-29 DIAGNOSIS — I10 Essential (primary) hypertension: Secondary | ICD-10-CM | POA: Diagnosis not present

## 2020-01-29 DIAGNOSIS — Z79899 Other long term (current) drug therapy: Secondary | ICD-10-CM | POA: Diagnosis not present

## 2020-01-29 DIAGNOSIS — F17210 Nicotine dependence, cigarettes, uncomplicated: Secondary | ICD-10-CM | POA: Diagnosis not present

## 2020-01-29 DIAGNOSIS — Z452 Encounter for adjustment and management of vascular access device: Secondary | ICD-10-CM | POA: Diagnosis not present

## 2020-01-29 DIAGNOSIS — R590 Localized enlarged lymph nodes: Secondary | ICD-10-CM | POA: Diagnosis not present

## 2020-01-29 DIAGNOSIS — C8174 Other classical Hodgkin lymphoma, lymph nodes of axilla and upper limb: Secondary | ICD-10-CM | POA: Diagnosis not present

## 2020-01-29 DIAGNOSIS — Z5111 Encounter for antineoplastic chemotherapy: Secondary | ICD-10-CM | POA: Diagnosis not present

## 2020-01-29 DIAGNOSIS — Z791 Long term (current) use of non-steroidal anti-inflammatories (NSAID): Secondary | ICD-10-CM | POA: Diagnosis not present

## 2020-01-29 DIAGNOSIS — Z95828 Presence of other vascular implants and grafts: Secondary | ICD-10-CM

## 2020-01-29 DIAGNOSIS — K219 Gastro-esophageal reflux disease without esophagitis: Secondary | ICD-10-CM | POA: Diagnosis not present

## 2020-01-29 LAB — CBC WITH DIFFERENTIAL (CANCER CENTER ONLY)
Abs Immature Granulocytes: 0.01 10*3/uL (ref 0.00–0.07)
Basophils Absolute: 0.1 10*3/uL (ref 0.0–0.1)
Basophils Relative: 1 %
Eosinophils Absolute: 0.2 10*3/uL (ref 0.0–0.5)
Eosinophils Relative: 4 %
HCT: 39.6 % (ref 39.0–52.0)
Hemoglobin: 13.1 g/dL (ref 13.0–17.0)
Immature Granulocytes: 0 %
Lymphocytes Relative: 40 %
Lymphs Abs: 2.6 10*3/uL (ref 0.7–4.0)
MCH: 28.7 pg (ref 26.0–34.0)
MCHC: 33.1 g/dL (ref 30.0–36.0)
MCV: 86.8 fL (ref 80.0–100.0)
Monocytes Absolute: 0.8 10*3/uL (ref 0.1–1.0)
Monocytes Relative: 12 %
Neutro Abs: 2.8 10*3/uL (ref 1.7–7.7)
Neutrophils Relative %: 43 %
Platelet Count: 315 10*3/uL (ref 150–400)
RBC: 4.56 MIL/uL (ref 4.22–5.81)
RDW: 13.6 % (ref 11.5–15.5)
WBC Count: 6.5 10*3/uL (ref 4.0–10.5)
nRBC: 0 % (ref 0.0–0.2)

## 2020-01-29 LAB — CMP (CANCER CENTER ONLY)
ALT: 29 U/L (ref 0–44)
AST: 20 U/L (ref 15–41)
Albumin: 3.9 g/dL (ref 3.5–5.0)
Alkaline Phosphatase: 111 U/L (ref 38–126)
Anion gap: 6 (ref 5–15)
BUN: 17 mg/dL (ref 8–23)
CO2: 25 mmol/L (ref 22–32)
Calcium: 9.3 mg/dL (ref 8.9–10.3)
Chloride: 108 mmol/L (ref 98–111)
Creatinine: 0.85 mg/dL (ref 0.61–1.24)
GFR, Est AFR Am: >60 mL/min (ref >60)
GFR, Estimated: >60 mL/min (ref >60)
Glucose, Bld: 107 mg/dL — ABNORMAL HIGH (ref 70–99)
Potassium: 3.9 mmol/L (ref 3.5–5.1)
Sodium: 139 mmol/L (ref 135–145)
Total Bilirubin: <0.2 mg/dL — ABNORMAL LOW (ref 0.3–1.2)
Total Protein: 6.9 g/dL (ref 6.5–8.1)

## 2020-01-29 LAB — LACTATE DEHYDROGENASE: LDH: 139 U/L (ref 98–192)

## 2020-01-29 MED ORDER — PALONOSETRON HCL INJECTION 0.25 MG/5ML
INTRAVENOUS | Status: AC
  Filled 2020-01-29: qty 5

## 2020-01-29 MED ORDER — DOXORUBICIN HCL CHEMO IV INJECTION 2 MG/ML
25.0000 mg/m2 | Freq: Once | INTRAVENOUS | Status: AC
  Administered 2020-01-29: 14:00:00 62 mg via INTRAVENOUS
  Filled 2020-01-29: qty 31

## 2020-01-29 MED ORDER — SODIUM CHLORIDE 0.9% FLUSH
10.0000 mL | INTRAVENOUS | Status: DC | PRN
  Administered 2020-01-29: 15:00:00 10 mL
  Filled 2020-01-29: qty 10

## 2020-01-29 MED ORDER — DEXAMETHASONE SODIUM PHOSPHATE 10 MG/ML IJ SOLN
INTRAMUSCULAR | Status: AC
  Filled 2020-01-29: qty 1

## 2020-01-29 MED ORDER — PALONOSETRON HCL INJECTION 0.25 MG/5ML
0.2500 mg | Freq: Once | INTRAVENOUS | Status: AC
  Administered 2020-01-29: 13:00:00 0.25 mg via INTRAVENOUS

## 2020-01-29 MED ORDER — SODIUM CHLORIDE 0.9 % IV SOLN
375.0000 mg/m2 | Freq: Once | INTRAVENOUS | Status: AC
  Administered 2020-01-29: 14:00:00 920 mg via INTRAVENOUS
  Filled 2020-01-29: qty 92

## 2020-01-29 MED ORDER — SODIUM CHLORIDE 0.9 % IV SOLN
150.0000 mg | Freq: Once | INTRAVENOUS | Status: AC
  Administered 2020-01-29: 13:00:00 150 mg via INTRAVENOUS
  Filled 2020-01-29: qty 150

## 2020-01-29 MED ORDER — HEPARIN SOD (PORK) LOCK FLUSH 100 UNIT/ML IV SOLN
500.0000 [IU] | Freq: Once | INTRAVENOUS | Status: AC | PRN
  Administered 2020-01-29: 15:00:00 500 [IU]
  Filled 2020-01-29: qty 5

## 2020-01-29 MED ORDER — DEXAMETHASONE SODIUM PHOSPHATE 10 MG/ML IJ SOLN
10.0000 mg | Freq: Once | INTRAMUSCULAR | Status: AC
  Administered 2020-01-29: 10 mg via INTRAVENOUS

## 2020-01-29 MED ORDER — SODIUM CHLORIDE 0.9 % IV SOLN
Freq: Once | INTRAVENOUS | Status: AC
  Filled 2020-01-29: qty 250

## 2020-01-29 MED ORDER — HEPARIN SOD (PORK) LOCK FLUSH 100 UNIT/ML IV SOLN
500.0000 [IU] | Freq: Once | INTRAVENOUS | Status: DC | PRN
  Filled 2020-01-29: qty 5

## 2020-01-29 MED ORDER — VINBLASTINE SULFATE CHEMO INJECTION 1 MG/ML
6.0000 mg/m2 | Freq: Once | INTRAVENOUS | Status: AC
  Administered 2020-01-29: 14:00:00 14.6 mg via INTRAVENOUS
  Filled 2020-01-29: qty 14.6

## 2020-01-29 MED ORDER — SODIUM CHLORIDE 0.9% FLUSH
10.0000 mL | INTRAVENOUS | Status: DC | PRN
  Administered 2020-01-29: 10 mL
  Filled 2020-01-29: qty 10

## 2020-01-29 NOTE — Progress Notes (Signed)
[name] [phone]) [phone]   [phone]  PROGRESS NOTE  Patient Care Team: [name], MD as PCP - General (Internal Medicine)  Hematological/Oncological History # Classic Hodgkin's Lymphoma, Stage II 1)10/10/2019: CT scan for right shoulder pain showed increased right axillary lymphadenopathy.  2) 12/10/2019: patient underwent US of the right axilla, noted to have right axillary lymphadenopathy 3) 12/15/2019: [place] axillary lymph node biopsy. Pathology confirms Classical Hodgkin's lymphoma 4) 12/24/2019: Establish care with Dr. Lorenso Courier 5)  12/31/2019: PET CT scan showed signs of right axillary adenopathy with increased metabolic activity 6) A999333: Cycle 1 Day 1 of AVD chemotherapy (Bleomycin to be held for the duration of treatment) 7) 01/29/2020: Cycle 1 Day 14 of AVD chemotherapy   Interval History:  Marc Watson 64 y.o. male with medical history significant for classical Hodgkin Lymphoma who presents for a follow up visit. The patient's last visit was on 12/24/2019. In the interim since the last visit he started Cycle 1 of AVD chemotherapy.   On exam today Marc Watson notes that he feels well.  Reports that the Monday after his first dose of AVD chemotherapy he had whole body itching which resolved with children's chewable Benadryl.  He called our office to discuss the findings and we recommended continued Benadryl as needed for the itching symptoms and he reports he has not had any itching since.  He reports that he had some mild nausea but no vomiting and he reports that he has had no diarrhea.  He endorses continued to have a good appetite and reports that his weight has actually increased since he first started.  He has had no other symptoms of fever, chills, sweats, shortness of breath, chest pain, or lower extremity swelling.  He has not had any hair loss.  A full 10 point ROS is listed below.  MEDICAL HISTORY:  Past Medical History:  Diagnosis Date     Arthritis    COPD (chronic obstructive pulmonary disease) ([place])    per CXR on 11/11/2019   GERD (gastroesophageal reflux disease)    occ   Hypertension    recently started taking on 11/05/2019    SURGICAL HISTORY: Past Surgical History:  Procedure Laterality Date   IR IMAGING GUIDED PORT INSERTION  01/05/2020   JOINT REPLACEMENT  2018   left hip   KNEE SURGERY Right    cartilage   LEG SURGERY Left 1969   "stunted growth left leg"   TOTAL HIP ARTHROPLASTY Left 03/27/2017   TOTAL HIP ARTHROPLASTY Left 03/27/2017   Procedure: TOTAL HIP ARTHROPLASTY ANTERIOR APPROACH;  Surgeon: Melrose Nakayama, MD;  Location: [facility];  Service: Orthopedics;  Laterality: Left;   TOTAL SHOULDER ARTHROPLASTY Right 11/13/2019   Procedure: TOTAL SHOULDER ARTHROPLASTY;  Surgeon: Tania Ade, MD;  Location: [facility];  Service: Orthopedics;  Laterality: Right;    SOCIAL HISTORY: Social History   Socioeconomic History   Marital status: Married    Spouse name: Not on file   Number of children: Not on file   Years of education: Not on file   Highest education level: Not on file  Occupational History   Not on file  Tobacco Use   Smoking status: Current Every Day Smoker    Packs/day: 0.50    Years: 30.00    Pack years: 15.00   Smokeless tobacco: Never Used  Substance and Sexual Activity   Alcohol use: Yes    Comment: occassionally   Drug use: Yes    Types: Cocaine  Comment: last used 02/2017- none since   Sexual activity: Not on file  Other Topics Concern   Not on file  Social History Narrative   Not on file   Social Determinants of Health   Financial Resource Strain:    Difficulty of Paying Living Expenses: Not on file  Food Insecurity:    Worried About [place] in the Last Year: Not on file   Ran Out of Food in the Last Year: Not on file  Transportation Needs:    Lack of Transportation (Medical): Not on file   Lack of Transportation  (Non-Medical): Not on file  Physical Activity:    Days of Exercise per Week: Not on file   Minutes of Exercise per Session: Not on file  Stress:    Feeling of Stress : Not on file  Social Connections:    Frequency of Communication with Friends and Family: Not on file   Frequency of Social Gatherings with Friends and Family: Not on file   Attends Religious Services: Not on file   Active Member of Clubs or Organizations: Not on file   Attends Archivist Meetings: Not on file   Marital Status: Not on file  Intimate Partner Violence:    Fear of Current or Ex-Partner: Not on file   Emotionally Abused: Not on file   Physically Abused: Not on file   Sexually Abused: Not on file    FAMILY HISTORY: No family history on file.  ALLERGIES:  is allergic to no known allergies.  MEDICATIONS:  Current Outpatient Medications  Medication Sig Dispense Refill   cyclobenzaprine (FLEXERIL) 10 MG tablet Take 10 mg by mouth at bedtime as needed for muscle spasms.      lidocaine-prilocaine (EMLA) cream Apply 1 application topically as needed. 30 g 1   losartan (COZAAR) 50 MG tablet Take 50 mg by mouth daily.     naproxen (NAPROSYN) 250 MG tablet Take by mouth 2 (two) times daily with a meal. As needed     Omega-3 Fatty Acids (FISH OIL PO) Take 2 capsules by mouth daily. OMEGA XL PROPRIETARY BLEND 300 MG d-ALPHA TOCOPHEROL (VITAMIN E)/EXTRA VIRGIN OLIVE OIL/EXTRACT (pcso-524) CONTAINING OMEGA FATTY ACIDS, GREEN LIPPED MUSSEL (PERNA CANLICULUS) OIL)      ondansetron (ZOFRAN) 8 MG tablet Take 1 tablet (8 mg total) by mouth every 8 (eight) hours as needed for nausea or vomiting. (Patient not taking: Reported on 01/29/2020) 30 tablet 1   oxyCODONE-acetaminophen (PERCOCET) 5-325 MG tablet Take 1-2 tablets every 4 hours as needed for post operative pain. MAX 6/day (Patient not taking: Reported on 01/29/2020) 30 tablet 0   prochlorperazine (COMPAZINE) 10 MG tablet Take 1 tablet (10 mg  total) by mouth every 6 (six) hours as needed for nausea or vomiting. (Patient not taking: Reported on 01/29/2020) 30 tablet 1   No current facility-administered medications for this visit.    REVIEW OF SYSTEMS:   Constitutional: ( - ) fevers, ( - )  chills , ( - ) night sweats Eyes: ( - ) blurriness of vision, ( - ) double vision, ( - ) watery eyes Ears, nose, mouth, throat, and face: ( - ) mucositis, ( - ) sore throat Respiratory: ( - ) cough, ( - ) dyspnea, ( - ) wheezes Cardiovascular: ( - ) palpitation, ( - ) chest discomfort, ( - ) lower extremity swelling Gastrointestinal:  ( - ) nausea, ( - ) heartburn, ( - ) change in bowel habits Skin: ( - )  abnormal skin rashes Lymphatics: ( - ) new lymphadenopathy, ( - ) easy bruising Neurological: ( - ) numbness, ( - ) tingling, ( - ) new weaknesses Behavioral/Psych: ( - ) mood change, ( - ) new changes  All other systems were reviewed with the patient and are negative.  PHYSICAL EXAMINATION: ECOG PERFORMANCE STATUS: 0 - Asymptomatic  There were no vitals filed for this visit. There were no vitals filed for this visit.  GENERAL: well appearing middle aged Caucasian male in NAD  SKIN: skin color, texture, turgor are normal, no rashes or significant lesions EYES: conjunctiva are pink and non-injected, sclera clear LUNGS: clear to auscultation and percussion with normal breathing effort HEART: regular rate & rhythm and no murmurs and no lower extremity edema Musculoskeletal: no cyanosis of digits and no clubbing  PSYCH: alert & oriented x 3, fluent speech NEURO: no focal motor/sensory deficits  LABORATORY DATA:  I have reviewed the data as listed CBC Latest Ref Rng & Units 01/29/2020 01/23/2020 01/16/2020  WBC 4.0 - 10.5 K/uL 6.5 7.9 9.5  Hemoglobin 13.0 - 17.0 g/dL 13.1 12.8(L) 13.4  Hematocrit 39.0 - 52.0 % 39.6 39.1 41.2  Platelets 150 - 400 K/uL 315 303 285    CMP Latest Ref Rng & Units 01/23/2020 01/16/2020 12/24/2019  Glucose 70 - 99  mg/dL 119(H) 106(H) 108(H)  BUN 8 - 23 mg/dL 19 17 22   Creatinine 0.61 - 1.24 mg/dL 0.83 0.82 1.04  Sodium 135 - 145 mmol/L 141 141 144  Potassium 3.5 - 5.1 mmol/L 4.1 4.0 4.1  Chloride 98 - 111 mmol/L 106 107 111  CO2 22 - 32 mmol/L 26 27 22   Calcium 8.9 - 10.3 mg/dL 9.4 9.4 9.0  Total Protein 6.5 - 8.1 g/dL 6.7 6.8 7.1  Total Bilirubin 0.3 - 1.2 mg/dL <0.2(L) 0.2(L) <0.2(L)  Alkaline Phos 38 - 126 U/L 116 116 148(H)  AST 15 - 41 U/L 19 27 14(L)  ALT 0 - 44 U/L 34 47(H) 15     RADIOGRAPHIC STUDIES: I have personally reviewed the radiological images as listed and agreed with the findings in the report: FDG avidity in the left axilla with increased lymphadenopathy in the chest, no increase in FDG.  NM PET Image Initial (PI) Skull Base To Thigh  Result Date: 12/31/2019 CLINICAL DATA:  Initial treatment strategy for Hodgkin lymphoma. EXAM: NUCLEAR MEDICINE PET SKULL BASE TO THIGH TECHNIQUE: 12.5 mCi F-18 FDG was injected intravenously. Full-ring PET imaging was performed from the skull base to thigh after the radiotracer. CT data was obtained and used for attenuation correction and anatomic localization. Fasting blood glucose: 108 mg/dl COMPARISON:  Axillary ultrasound from 12/15/2019 FINDINGS: Mediastinal blood pool activity: SUV max 2.4 Liver activity: SUV max 3.7 NECK: Increased metabolic activity in tonsillar tissue, palatine tonsils bilaterally (SUVmax = 8.4 Mildly enlarged lymph nodes in the bilateral neck with mild increased FDG avidity seen at level II 2 and III tracking into IV. Representative node at level 2 on the left (image 29, series 4) short axis dimension of 1.3 cm (SUVmax = 2.9) ) Incidental CT findings: none CHEST: (Image 81, series 4) largest right axillary lymph node measuring 1.6 cm short axis (SUVmax = 11.0 smaller nodes with similar or slightly diminished activity in the right axilla. Left axillary lymph nodes with mild enlargement, retaining fatty hila and with FDG uptake  less than or equal to blood pool. Also with high AP window lymph node, the largest of mediastinal lymph nodes (image 75, series  4) 1.5 cm (SUVmax = 2.9) no additional areas of suspicion in the chest.) Incidental CT findings: Scattered calcific atherosclerotic changes and coronary arteries. Nodular thyroid. 2.0 cm nodule rising from left inferior thyroid. This does not show FDG uptake beyond background. ABDOMEN/PELVIS: No abnormal hypermetabolic activity within the liver, pancreas, adrenal glands, or spleen. No hypermetabolic lymph nodes in the abdomen or pelvis. Incidental CT findings: 1.6 cm left adrenal adenoma. Noncontrast appearance of liver, gallbladder, spleen, pancreas, right adrenal gland and right kidney are unremarkable. The cortical scarring involving left kidney. Small low-density lesions in the lower pole, likely cysts, also in the upper pole the left kidney. No acute bowel process.  Colonic diverticulosis.  Normal appendix. Signs of calcified atherosclerotic change of the abdominal aorta without aneurysm. Scattered small lymph nodes none with pathologic enlargement or increased FDG uptake in the retroperitoneum and upper abdomen. SKELETON: Marked degenerative changes with resultant FDG uptake about the right shoulder following right shoulder arthroplasty. Expected photopenia about a left hip arthroplasty. Incidental CT findings: Arthroplasty changes in the right shoulder and left hip. IMPRESSION: 1. Signs of right axillary adenopathy with increased metabolic activity as described. 2. Tonsillar activity in the neck that is increased, greater than twice that of background liver. Attention on follow-up. 3. Mildly enlarged lymph nodes in the neck, left axilla and mediastinum as described without increased metabolic activity, attention on follow-up. 4. Signs of nodular thyroid with 2 cm left thyroid nodule. Recommend thyroid US (ref: J Am Coll Radiol. 2015 Feb;12(2): 143-50). 5. Signs of recent right  shoulder arthroplasty and prior left hip arthroplasty. 6. Left adrenal adenoma. Electronically Signed   By: Zetta Bills M.D.   On: 12/31/2019 09:27    ASSESSMENT & PLAN Marc Watson 64 y.o. male with medical history significant for classical Hodgkin Lymphoma who presents for a follow up visit. He completed Cycle 1 Day 1 of therapy and now presents for Cycle 1 Day 14 treatment.   Overall Marc Watson is tolerating therapy well with minimal side effects including some itching which has resolved and mild nausea without vomiting.  Today his counts also appear robust and he has no complaints or concerns moving forward.  As such I think it is appropriate to continue with AVD chemotherapy as planned.  The current plan is for AVD chemotherapy x 2 cycles then reassessment. The regimen consists of doxorubicin 25mg /m2, vinblastine 6mg /m2, and dacarbazine 375mg /m2 on Day 1 and Day 15. Bleomycin will be held on concern for his lung function and for his age. He received Cycle 1 Day 1 on 01/16/2020.   # Classic Hodgkin's Lymphoma, Stage II --continue AVD chemotherapy (with no bleomycin). Today is Cycle 1 Day 14 (due to scheduling had to move up 1 day early than anticipated Day 15) --after completion of 2 full cycles will plan for a repeat PET scan to determine next course of action (duration of remaining chemo vs rads)  --patient has undergone appropriate pre-treatment testing including TTE, port placement, and blood work. PFTs were not able to be performed due to issues with scheduling during the [place] pandemic. Bleomycin held due to age and concern for underlying lung disease --labs to be drawn weekly, next due 02/05/2020 --plan for RTC on 02/12/2020 prior to Cycle 2 Day 1 dose of chemotherapy.   #Thyroid Nodule --found incidentally on last PET CT scan on 12/31/2019 --will address with ultrasound once chemotherapy is underway --continue to monitor   #Itching, resolved  --recommend benadryl PRN --call for  worsening or  intractable itching  #Symptom Management --patient provided with PRN medications for zofran and compazine for nausea  --EMLA cream provided for port  --will continue to monitor through course of treatment   No orders of the defined types were placed in this encounter.  All questions were answered. The patient knows to call the clinic with any problems, questions or concerns.  A total of more than 30 minutes were spent on this encounter and over half of that time was spent on counseling and coordination of care as outlined above.   Marc Peoples, MD Department of Hematology/Oncology [place] at [facility] Phone: [phone] Pager: [phone] Email: [email or web address]  01/29/2020 12:05 PM

## 2020-01-30 ENCOUNTER — Telehealth: Payer: Self-pay | Admitting: Hematology and Oncology

## 2020-01-30 NOTE — Telephone Encounter (Signed)
Scheduled per los. Called and spoke with patient. Confirmed appt 

## 2020-02-05 ENCOUNTER — Inpatient Hospital Stay: Payer: BC Managed Care – PPO

## 2020-02-05 ENCOUNTER — Other Ambulatory Visit: Payer: Self-pay

## 2020-02-05 DIAGNOSIS — C8104 Nodular lymphocyte predominant Hodgkin lymphoma, lymph nodes of axilla and upper limb: Secondary | ICD-10-CM

## 2020-02-05 DIAGNOSIS — C8174 Other classical Hodgkin lymphoma, lymph nodes of axilla and upper limb: Secondary | ICD-10-CM | POA: Diagnosis not present

## 2020-02-05 DIAGNOSIS — I10 Essential (primary) hypertension: Secondary | ICD-10-CM | POA: Diagnosis not present

## 2020-02-05 DIAGNOSIS — Z5111 Encounter for antineoplastic chemotherapy: Secondary | ICD-10-CM | POA: Diagnosis not present

## 2020-02-05 DIAGNOSIS — K219 Gastro-esophageal reflux disease without esophagitis: Secondary | ICD-10-CM | POA: Diagnosis not present

## 2020-02-05 DIAGNOSIS — Z452 Encounter for adjustment and management of vascular access device: Secondary | ICD-10-CM | POA: Diagnosis not present

## 2020-02-05 DIAGNOSIS — Z79899 Other long term (current) drug therapy: Secondary | ICD-10-CM | POA: Diagnosis not present

## 2020-02-05 DIAGNOSIS — F17210 Nicotine dependence, cigarettes, uncomplicated: Secondary | ICD-10-CM | POA: Diagnosis not present

## 2020-02-05 DIAGNOSIS — J449 Chronic obstructive pulmonary disease, unspecified: Secondary | ICD-10-CM | POA: Diagnosis not present

## 2020-02-05 DIAGNOSIS — E042 Nontoxic multinodular goiter: Secondary | ICD-10-CM | POA: Diagnosis not present

## 2020-02-05 DIAGNOSIS — R590 Localized enlarged lymph nodes: Secondary | ICD-10-CM | POA: Diagnosis not present

## 2020-02-05 DIAGNOSIS — Z791 Long term (current) use of non-steroidal anti-inflammatories (NSAID): Secondary | ICD-10-CM | POA: Diagnosis not present

## 2020-02-05 LAB — CMP (CANCER CENTER ONLY)
ALT: 32 U/L (ref 0–44)
AST: 18 U/L (ref 15–41)
Albumin: 3.8 g/dL (ref 3.5–5.0)
Alkaline Phosphatase: 101 U/L (ref 38–126)
Anion gap: 8 (ref 5–15)
BUN: 17 mg/dL (ref 8–23)
CO2: 25 mmol/L (ref 22–32)
Calcium: 9.5 mg/dL (ref 8.9–10.3)
Chloride: 110 mmol/L (ref 98–111)
Creatinine: 0.92 mg/dL (ref 0.61–1.24)
GFR, Est AFR Am: >60 mL/min (ref >60)
GFR, Estimated: >60 mL/min (ref >60)
Glucose, Bld: 118 mg/dL — ABNORMAL HIGH (ref 70–99)
Potassium: 3.9 mmol/L (ref 3.5–5.1)
Sodium: 143 mmol/L (ref 135–145)
Total Bilirubin: <0.2 mg/dL — ABNORMAL LOW (ref 0.3–1.2)
Total Protein: 6.9 g/dL (ref 6.5–8.1)

## 2020-02-05 LAB — CBC WITH DIFFERENTIAL (CANCER CENTER ONLY)
Abs Immature Granulocytes: 0.04 10*3/uL (ref 0.00–0.07)
Basophils Absolute: 0.1 10*3/uL (ref 0.0–0.1)
Basophils Relative: 1 %
Eosinophils Absolute: 0.2 10*3/uL (ref 0.0–0.5)
Eosinophils Relative: 3 %
HCT: 39.2 % (ref 39.0–52.0)
Hemoglobin: 13 g/dL (ref 13.0–17.0)
Immature Granulocytes: 1 %
Lymphocytes Relative: 42 %
Lymphs Abs: 2.5 10*3/uL (ref 0.7–4.0)
MCH: 29.1 pg (ref 26.0–34.0)
MCHC: 33.2 g/dL (ref 30.0–36.0)
MCV: 87.7 fL (ref 80.0–100.0)
Monocytes Absolute: 0.4 10*3/uL (ref 0.1–1.0)
Monocytes Relative: 7 %
Neutro Abs: 2.7 10*3/uL (ref 1.7–7.7)
Neutrophils Relative %: 46 %
Platelet Count: 357 10*3/uL (ref 150–400)
RBC: 4.47 MIL/uL (ref 4.22–5.81)
RDW: 13.7 % (ref 11.5–15.5)
WBC Count: 5.9 10*3/uL (ref 4.0–10.5)
nRBC: 0 % (ref 0.0–0.2)

## 2020-02-05 LAB — LACTATE DEHYDROGENASE: LDH: 153 U/L (ref 98–192)

## 2020-02-06 DIAGNOSIS — Z9889 Other specified postprocedural states: Secondary | ICD-10-CM | POA: Diagnosis not present

## 2020-02-12 ENCOUNTER — Other Ambulatory Visit: Payer: Self-pay | Admitting: Hematology and Oncology

## 2020-02-12 ENCOUNTER — Other Ambulatory Visit: Payer: Self-pay

## 2020-02-12 ENCOUNTER — Inpatient Hospital Stay: Payer: BC Managed Care – PPO | Attending: Hematology and Oncology | Admitting: Hematology and Oncology

## 2020-02-12 ENCOUNTER — Inpatient Hospital Stay: Payer: BC Managed Care – PPO

## 2020-02-12 ENCOUNTER — Encounter: Payer: Self-pay | Admitting: Hematology and Oncology

## 2020-02-12 VITALS — BP 137/88 | HR 80 | Temp 98.2°F | Resp 18 | Ht 73.0 in | Wt 263.1 lb

## 2020-02-12 DIAGNOSIS — Z791 Long term (current) use of non-steroidal anti-inflammatories (NSAID): Secondary | ICD-10-CM | POA: Insufficient documentation

## 2020-02-12 DIAGNOSIS — I10 Essential (primary) hypertension: Secondary | ICD-10-CM | POA: Insufficient documentation

## 2020-02-12 DIAGNOSIS — C8104 Nodular lymphocyte predominant Hodgkin lymphoma, lymph nodes of axilla and upper limb: Secondary | ICD-10-CM

## 2020-02-12 DIAGNOSIS — M1990 Unspecified osteoarthritis, unspecified site: Secondary | ICD-10-CM | POA: Insufficient documentation

## 2020-02-12 DIAGNOSIS — J449 Chronic obstructive pulmonary disease, unspecified: Secondary | ICD-10-CM | POA: Diagnosis not present

## 2020-02-12 DIAGNOSIS — R140 Abdominal distension (gaseous): Secondary | ICD-10-CM | POA: Diagnosis not present

## 2020-02-12 DIAGNOSIS — K219 Gastro-esophageal reflux disease without esophagitis: Secondary | ICD-10-CM | POA: Diagnosis not present

## 2020-02-12 DIAGNOSIS — Z95828 Presence of other vascular implants and grafts: Secondary | ICD-10-CM

## 2020-02-12 DIAGNOSIS — E041 Nontoxic single thyroid nodule: Secondary | ICD-10-CM | POA: Diagnosis not present

## 2020-02-12 DIAGNOSIS — Z79899 Other long term (current) drug therapy: Secondary | ICD-10-CM | POA: Insufficient documentation

## 2020-02-12 DIAGNOSIS — Z5111 Encounter for antineoplastic chemotherapy: Secondary | ICD-10-CM | POA: Insufficient documentation

## 2020-02-12 DIAGNOSIS — C8174 Other classical Hodgkin lymphoma, lymph nodes of axilla and upper limb: Secondary | ICD-10-CM | POA: Insufficient documentation

## 2020-02-12 DIAGNOSIS — R110 Nausea: Secondary | ICD-10-CM | POA: Insufficient documentation

## 2020-02-12 LAB — CBC WITH DIFFERENTIAL (CANCER CENTER ONLY)
Abs Immature Granulocytes: 0.03 10*3/uL (ref 0.00–0.07)
Basophils Absolute: 0.1 10*3/uL (ref 0.0–0.1)
Basophils Relative: 1 %
Eosinophils Absolute: 0.1 10*3/uL (ref 0.0–0.5)
Eosinophils Relative: 1 %
HCT: 41.6 % (ref 39.0–52.0)
Hemoglobin: 13.8 g/dL (ref 13.0–17.0)
Immature Granulocytes: 0 %
Lymphocytes Relative: 32 %
Lymphs Abs: 2.7 10*3/uL (ref 0.7–4.0)
MCH: 29.3 pg (ref 26.0–34.0)
MCHC: 33.2 g/dL (ref 30.0–36.0)
MCV: 88.3 fL (ref 80.0–100.0)
Monocytes Absolute: 1 10*3/uL (ref 0.1–1.0)
Monocytes Relative: 12 %
Neutro Abs: 4.5 10*3/uL (ref 1.7–7.7)
Neutrophils Relative %: 54 %
Platelet Count: 328 10*3/uL (ref 150–400)
RBC: 4.71 MIL/uL (ref 4.22–5.81)
RDW: 14.2 % (ref 11.5–15.5)
WBC Count: 8.3 10*3/uL (ref 4.0–10.5)
nRBC: 0 % (ref 0.0–0.2)

## 2020-02-12 LAB — CMP (CANCER CENTER ONLY)
ALT: 30 U/L (ref 0–44)
AST: 22 U/L (ref 15–41)
Albumin: 4 g/dL (ref 3.5–5.0)
Alkaline Phosphatase: 102 U/L (ref 38–126)
Anion gap: 9 (ref 5–15)
BUN: 17 mg/dL (ref 8–23)
CO2: 24 mmol/L (ref 22–32)
Calcium: 9.3 mg/dL (ref 8.9–10.3)
Chloride: 110 mmol/L (ref 98–111)
Creatinine: 0.83 mg/dL (ref 0.61–1.24)
GFR, Est AFR Am: >60 mL/min (ref >60)
GFR, Estimated: >60 mL/min (ref >60)
Glucose, Bld: 105 mg/dL — ABNORMAL HIGH (ref 70–99)
Potassium: 4.1 mmol/L (ref 3.5–5.1)
Sodium: 143 mmol/L (ref 135–145)
Total Bilirubin: <0.2 mg/dL — ABNORMAL LOW (ref 0.3–1.2)
Total Protein: 7 g/dL (ref 6.5–8.1)

## 2020-02-12 LAB — LACTATE DEHYDROGENASE: LDH: 172 U/L (ref 98–192)

## 2020-02-12 MED ORDER — DEXAMETHASONE SODIUM PHOSPHATE 10 MG/ML IJ SOLN
INTRAMUSCULAR | Status: AC
  Filled 2020-02-12: qty 1

## 2020-02-12 MED ORDER — PALONOSETRON HCL INJECTION 0.25 MG/5ML
0.2500 mg | Freq: Once | INTRAVENOUS | Status: AC
  Administered 2020-02-12: 0.25 mg via INTRAVENOUS

## 2020-02-12 MED ORDER — PALONOSETRON HCL INJECTION 0.25 MG/5ML
INTRAVENOUS | Status: AC
  Filled 2020-02-12: qty 5

## 2020-02-12 MED ORDER — SODIUM CHLORIDE 0.9% FLUSH
10.0000 mL | INTRAVENOUS | Status: DC | PRN
  Administered 2020-02-12: 10 mL
  Filled 2020-02-12: qty 10

## 2020-02-12 MED ORDER — DOXORUBICIN HCL CHEMO IV INJECTION 2 MG/ML
25.0000 mg/m2 | Freq: Once | INTRAVENOUS | Status: AC
  Administered 2020-02-12: 12:00:00 62 mg via INTRAVENOUS
  Filled 2020-02-12: qty 31

## 2020-02-12 MED ORDER — HEPARIN SOD (PORK) LOCK FLUSH 100 UNIT/ML IV SOLN
500.0000 [IU] | Freq: Once | INTRAVENOUS | Status: AC | PRN
  Administered 2020-02-12: 500 [IU]
  Filled 2020-02-12: qty 5

## 2020-02-12 MED ORDER — SODIUM CHLORIDE 0.9 % IV SOLN
150.0000 mg | Freq: Once | INTRAVENOUS | Status: AC
  Administered 2020-02-12: 150 mg via INTRAVENOUS
  Filled 2020-02-12: qty 150

## 2020-02-12 MED ORDER — DEXAMETHASONE SODIUM PHOSPHATE 10 MG/ML IJ SOLN
10.0000 mg | Freq: Once | INTRAMUSCULAR | Status: AC
  Administered 2020-02-12: 10 mg via INTRAVENOUS

## 2020-02-12 MED ORDER — SODIUM CHLORIDE 0.9 % IV SOLN
375.0000 mg/m2 | Freq: Once | INTRAVENOUS | Status: AC
  Administered 2020-02-12: 920 mg via INTRAVENOUS
  Filled 2020-02-12: qty 92

## 2020-02-12 MED ORDER — SODIUM CHLORIDE 0.9 % IV SOLN
Freq: Once | INTRAVENOUS | Status: AC
  Filled 2020-02-12: qty 250

## 2020-02-12 MED ORDER — VINBLASTINE SULFATE CHEMO INJECTION 1 MG/ML
6.1500 mg/m2 | Freq: Once | INTRAVENOUS | Status: AC
  Administered 2020-02-12: 12:00:00 15 mg via INTRAVENOUS
  Filled 2020-02-12: qty 15

## 2020-02-12 MED ORDER — OMEPRAZOLE 40 MG PO CPDR: 40.0000 mg | DELAYED_RELEASE_CAPSULE | Freq: Every day | ORAL | 3 refills | Status: DC

## 2020-02-12 NOTE — Progress Notes (Signed)
[place] [phone]   [phone]  PROGRESS NOTE  Patient Care Team: [name], MD as PCP - General (Internal Medicine)  Hematological/Oncological History # Classical Hodgkin's Lymphoma, Stage II 1)10/10/2019: CT scan for right shoulder pain showed increased right axillary lymphadenopathy.  2) 12/10/2019: patient underwent US of the right axilla, noted to have right axillary lymphadenopathy 3) 12/15/2019: [place] axillary lymph node biopsy. Pathology confirms Classical Hodgkin's lymphoma 4) 12/24/2019: Establish care with Dr. Lorenso Courier 5)  12/31/2019: PET CT scan showed signs of right axillary adenopathy with increased metabolic activity 6) A999333: Cycle 1 Day 1 of AVD chemotherapy (Bleomycin to be held for the duration of treatment) 7) 01/29/2020: Cycle 1 Day 14 of AVD chemotherapy  8) 02/12/2020: Cycle 2 Day 1 of AVD chemotherapy   Interval History:  Marc Watson 64 y.o. male with medical history significant for classical Hodgkin Lymphoma who presents for a follow up visit. The patient's last visit was on 01/29/2020. Today the patient will start Cycle 2 Day 1 of AVD chemotherapy.   Exam today Marc Watson notes that he feels well overall but is having some side effects from his treatment.  He notes that he has itching predominantly on the day he received chemotherapy he has been taking children's chewable Benadryl 4 pills prior to treatment and that has been helping.  He notes that he has been having quite a lot of gas since he started treatment.  He notes that he has been belching and passing gas a much higher volume than usual.  He notes that they are not particularly foul-smelling.  He does notice an increase in his number of bowel movements per day, reporting is having soft stools approximately 5 times per day.  He has not had any watery diarrhea or abdominal pain associated with this.  Furthermore he reports that he has had some mild nausea, but no  episodes of vomiting.  He reports that his appetite has been hit or miss and that he occasionally feels full after just taking a few bites.  He does note that he is not sleeping particularly well, but can get up to 5 to 6 hours of solid sleep per night.  He initially had attempted Zofran, but this caused him to have massive headache and therefore has been using Compazine alone.  Otherwise he does note some mild hair loss but overall his symptoms not been so severe that he would want to alter therapy.  Other than an increase in fatigue his review of systems was otherwise unremarkable.  A full 10 point ROS is listed below.   A full 10 point ROS is listed below.   MEDICAL HISTORY:  Past Medical History:  Diagnosis Date  . Arthritis   . COPD (chronic obstructive pulmonary disease) ([place])    per CXR on 11/11/2019  . GERD (gastroesophageal reflux disease)    occ  . Hypertension    recently started taking on 11/05/2019   ALLERGIES:  is allergic to no known allergies.  MEDICATIONS:  Current Outpatient Medications  Medication Sig Dispense Refill  . ondansetron (ZOFRAN) 8 MG tablet Take 1 tablet (8 mg total) by mouth every 8 (eight) hours as needed for nausea or vomiting. 30 tablet 1  . prochlorperazine (COMPAZINE) 10 MG tablet Take 1 tablet (10 mg total) by mouth every 6 (six) hours as needed for nausea or vomiting. 30 tablet 1  . cyclobenzaprine (FLEXERIL) 10 MG tablet Take 10 mg by mouth at bedtime as needed  for muscle spasms.     Marland Kitchen lidocaine-prilocaine (EMLA) cream Apply 1 application topically as needed. 30 g 1  . losartan (COZAAR) 50 MG tablet Take 50 mg by mouth daily.    . naproxen (NAPROSYN) 250 MG tablet Take by mouth 2 (two) times daily with a meal. As needed    . Omega-3 Fatty Acids (FISH OIL PO) Take 2 capsules by mouth daily. OMEGA XL PROPRIETARY BLEND 300 MG d-ALPHA TOCOPHEROL (VITAMIN E)/EXTRA VIRGIN OLIVE OIL/EXTRACT (pcso-524) CONTAINING OMEGA FATTY ACIDS, GREEN LIPPED MUSSEL (PERNA  CANLICULUS) OIL)      No current facility-administered medications for this visit.    REVIEW OF SYSTEMS:   Constitutional: ( - ) fevers, ( - )  chills , ( - ) night sweats Eyes: ( - ) blurriness of vision, ( - ) double vision, ( - ) watery eyes Ears, nose, mouth, throat, and face: ( - ) mucositis, ( - ) sore throat Respiratory: ( - ) cough, ( - ) dyspnea, ( - ) wheezes Cardiovascular: ( - ) palpitation, ( - ) chest discomfort, ( - ) lower extremity swelling Gastrointestinal:  ( + ) nausea, ( - ) heartburn, ( + ) change in bowel habits Skin: ( - ) abnormal skin rashes Lymphatics: ( - ) new lymphadenopathy, ( - ) easy bruising Neurological: ( - ) numbness, ( - ) tingling, ( - ) new weaknesses Behavioral/Psych: ( - ) mood change, ( - ) new changes  All other systems were reviewed with the patient and are negative.  PHYSICAL EXAMINATION: ECOG PERFORMANCE STATUS: 0 - Asymptomatic  Vitals:   02/12/20 0947  BP: 137/88  Pulse: 80  Resp: 18  Temp: 98.2 F (36.8 C)  SpO2: 99%   Filed Weights   02/12/20 0947  Weight: 263 lb 1.6 oz (119.3 kg)    GENERAL: well appearing middle aged Caucasian male in NAD  SKIN: skin color, texture, turgor are normal, no rashes or significant lesions EYES: conjunctiva are pink and non-injected, sclera clear LUNGS: clear to auscultation and percussion with normal breathing effort HEART: regular rate & rhythm and no murmurs and no lower extremity edema ABDOMEN: no HSM. Non-distended, not tender to palpation.  Musculoskeletal: no cyanosis of digits and no clubbing  PSYCH: alert & oriented x 3, fluent speech NEURO: no focal motor/sensory deficits  LABORATORY DATA:  I have reviewed the data as listed CBC Latest Ref Rng & Units 02/12/2020 02/05/2020 01/29/2020  WBC 4.0 - 10.5 K/uL 8.3 5.9 6.5  Hemoglobin 13.0 - 17.0 g/dL 13.8 13.0 13.1  Hematocrit 39.0 - 52.0 % 41.6 39.2 39.6  Platelets 150 - 400 K/uL 328 357 315    CMP Latest Ref Rng & Units 02/05/2020  01/29/2020 01/23/2020  Glucose 70 - 99 mg/dL 118(H) 107(H) 119(H)  BUN 8 - 23 mg/dL 17 17 19   Creatinine 0.61 - 1.24 mg/dL 0.92 0.85 0.83  Sodium 135 - 145 mmol/L 143 139 141  Potassium 3.5 - 5.1 mmol/L 3.9 3.9 4.1  Chloride 98 - 111 mmol/L 110 108 106  CO2 22 - 32 mmol/L 25 25 26   Calcium 8.9 - 10.3 mg/dL 9.5 9.3 9.4  Total Protein 6.5 - 8.1 g/dL 6.9 6.9 6.7  Total Bilirubin 0.3 - 1.2 mg/dL <0.2(L) <0.2(L) <0.2(L)  Alkaline Phos 38 - 126 U/L 101 111 116  AST 15 - 41 U/L 18 20 19   ALT 0 - 44 U/L 32 29 34     RADIOGRAPHIC STUDIES: I have personally reviewed the  radiological images as listed and agreed with the findings in the report: FDG avidity in the left axilla with increased lymphadenopathy in the chest, no increase in FDG.  No results found.  ASSESSMENT & PLAN ALDWIN ZICKEFOOSE 64 y.o. male with medical history significant for classical Hodgkin Lymphoma who presents for a follow up visit. He completed Cycle 1 of therapy and now presents for Cycle 2 Day 1 treatment.   Overall Marc Watson is tolerating therapy well with minimal side effects including some itching, mild nausea (no vomiting), and gas.  Today his counts continue to be robust and he has no concerns moving about forward.  As such I think it is appropriate to continue with AVD chemotherapy as planned.  The current plan is for AVD chemotherapy x 2 cycles then reassessment with PET CT scan. The regimen consists of doxorubicin 25mg /m2, vinblastine 6mg /m2, and dacarbazine 375mg /m2 on Day 1 and Day 15. Bleomycin will be held on concern for his lung function and for his age. He received Cycle 1 Day 1 on 01/16/2020.   # Classical Hodgkin's Lymphoma, Stage II --continue AVD chemotherapy (with no bleomycin). Today is Cycle 2 Day 1  --after completion of 2 full cycles will plan for a repeat PET scan to determine next course of action (duration of remaining chemo vs rads). Will schedule PET CT for the week of 03/01/2020 --patient has  undergone appropriate pre-treatment testing including TTE, port placement, and blood work. PFTs were not able to be performed due to issues with scheduling during the [place] pandemic. Bleomycin held due to age and concern for underlying lung disease --stable labs, can check q 2 weeks on chemotherapy days.  --plan for RTC on 02/26/2020 prior to Cycle 2 Day 14 dose of chemotherapy.   #Thyroid Nodule --found incidentally on last PET CT scan on 12/31/2019 --may require ultrasound to further evaluate --continue to monitor   #Itching, resolved  --continue benadryl PRN --patient notes he has some itching on day of treatment, but not as severe as prior episode.  --call for worsening or intractable itching  #Symptom Management --patient provided with PRN medications for zofran and compazine for nausea  --recommend omeprazole for heartburn, stomach upset. Patient can use OTC simethicone for excessive gas.  --EMLA cream provided for port  --will continue to monitor through course of treatment   No orders of the defined types were placed in this encounter.  All questions were answered. The patient knows to call the clinic with any problems, questions or concerns.  A total of more than 30 minutes were spent on this encounter and over half of that time was spent on counseling and coordination of care as outlined above.   [name], MD Department of Hematology/Oncology [place] at [facility] Phone: [phone] Pager: [phone] Email: [email or web address]  02/12/2020 10:07 AM

## 2020-02-12 NOTE — Patient Instructions (Signed)
[facility] Discharge Instructions for Patients Receiving Chemotherapy  Today you received the following chemotherapy agents: doxorubicin, vinblastine, and dacarbazine.  To help prevent nausea and vomiting after your treatment, we encourage you to take your nausea medication as directed.   If you develop nausea and vomiting that is not controlled by your nausea medication, call the clinic.   BELOW ARE SYMPTOMS THAT SHOULD BE REPORTED IMMEDIATELY:  *FEVER GREATER THAN 100.5 F  *CHILLS WITH OR WITHOUT FEVER  NAUSEA AND VOMITING THAT IS NOT CONTROLLED WITH YOUR NAUSEA MEDICATION  *UNUSUAL SHORTNESS OF BREATH  *UNUSUAL BRUISING OR BLEEDING  TENDERNESS IN MOUTH AND THROAT WITH OR WITHOUT PRESENCE OF ULCERS  *URINARY PROBLEMS  *BOWEL PROBLEMS  UNUSUAL RASH Items with * indicate a potential emergency and should be followed up as soon as possible.  Feel free to call the clinic should you have any questions or concerns. The clinic phone number is [phone].  Please show the CHEMO ALERT CARD at check-in to the Emergency Department and triage nurse.   

## 2020-02-12 NOTE — Patient Instructions (Signed)

## 2020-02-13 ENCOUNTER — Telehealth: Payer: Self-pay | Admitting: Hematology and Oncology

## 2020-02-13 NOTE — Telephone Encounter (Signed)
Added Md appt per 3/4 los. Called and spoke with patient. Notified of change in time on 3/18. Patient confirmed

## 2020-02-18 ENCOUNTER — Telehealth: Payer: Self-pay | Admitting: Emergency Medicine

## 2020-02-18 ENCOUNTER — Telehealth: Payer: Self-pay | Admitting: *Deleted

## 2020-02-18 NOTE — Telephone Encounter (Signed)
TCT patient regarding an episode of chest pain he experienced @ 4am this morning. He had called on call service and they relayed the information to Dr. Lorenso Courier. Spoke with patient now and asked how he was feeling. He states he feels fine. No further episodes of chest pain. Denies SOB, diaphoresis, nausea, vomiting etc.  Advised that if this happens again and does not go away spontaneously , that he should go to the ED in the event that this is cardiac related. He voiced understanding. Currently he is out shopping with his wife and feels well except for some fatigue.  He is in-between chemo cycles.  He is due to come back next week for labs, MD visit and chemo.

## 2020-02-18 NOTE — Telephone Encounter (Signed)
Returning pt's VM on triage line.  Pt reports having chest tightness w/out any other symptoms at 0400 this morning for about 10-12 minutes that then eased off and has not returned since.  Pt was advised by overnight call line to call back in the morning to see if he needed to f/u with his oncologist Lorenso Courier.  Pt states he is not having any tightness now or any SOB/DOE/CP or any other cardiac/resp symptoms.  Pt states he feels fine but wanted to let Lorenso Courier know in case he needed to be seen.  Spoke with MD Lorenso Courier who advised that pt monitor for any reoccurrences or similar symptoms and contact either [place] or his PCP right away if they occur again.  Pt does not have a hx of cardiac issues and does not have a cardiologist at this time.  Pt verbalized understanding of instructions and denies any further questions/concerns at this time.

## 2020-02-26 ENCOUNTER — Inpatient Hospital Stay (HOSPITAL_BASED_OUTPATIENT_CLINIC_OR_DEPARTMENT_OTHER): Payer: BC Managed Care – PPO | Admitting: Hematology and Oncology

## 2020-02-26 ENCOUNTER — Encounter: Payer: Self-pay | Admitting: Hematology and Oncology

## 2020-02-26 ENCOUNTER — Other Ambulatory Visit: Payer: Self-pay

## 2020-02-26 ENCOUNTER — Ambulatory Visit: Payer: BC Managed Care – PPO

## 2020-02-26 ENCOUNTER — Other Ambulatory Visit: Payer: BC Managed Care – PPO

## 2020-02-26 ENCOUNTER — Inpatient Hospital Stay: Payer: BC Managed Care – PPO

## 2020-02-26 VITALS — BP 136/86 | HR 83 | Temp 98.7°F | Resp 18 | Ht 73.0 in | Wt 264.9 lb

## 2020-02-26 DIAGNOSIS — R110 Nausea: Secondary | ICD-10-CM | POA: Diagnosis not present

## 2020-02-26 DIAGNOSIS — C8104 Nodular lymphocyte predominant Hodgkin lymphoma, lymph nodes of axilla and upper limb: Secondary | ICD-10-CM | POA: Diagnosis not present

## 2020-02-26 DIAGNOSIS — I10 Essential (primary) hypertension: Secondary | ICD-10-CM | POA: Diagnosis not present

## 2020-02-26 DIAGNOSIS — Z79899 Other long term (current) drug therapy: Secondary | ICD-10-CM | POA: Diagnosis not present

## 2020-02-26 DIAGNOSIS — Z5112 Encounter for antineoplastic immunotherapy: Secondary | ICD-10-CM | POA: Diagnosis not present

## 2020-02-26 DIAGNOSIS — C8174 Other classical Hodgkin lymphoma, lymph nodes of axilla and upper limb: Secondary | ICD-10-CM | POA: Diagnosis not present

## 2020-02-26 DIAGNOSIS — Z791 Long term (current) use of non-steroidal anti-inflammatories (NSAID): Secondary | ICD-10-CM | POA: Diagnosis not present

## 2020-02-26 DIAGNOSIS — Z95828 Presence of other vascular implants and grafts: Secondary | ICD-10-CM

## 2020-02-26 DIAGNOSIS — M1990 Unspecified osteoarthritis, unspecified site: Secondary | ICD-10-CM | POA: Diagnosis not present

## 2020-02-26 DIAGNOSIS — E041 Nontoxic single thyroid nodule: Secondary | ICD-10-CM

## 2020-02-26 DIAGNOSIS — K219 Gastro-esophageal reflux disease without esophagitis: Secondary | ICD-10-CM | POA: Diagnosis not present

## 2020-02-26 DIAGNOSIS — R140 Abdominal distension (gaseous): Secondary | ICD-10-CM | POA: Diagnosis not present

## 2020-02-26 DIAGNOSIS — J449 Chronic obstructive pulmonary disease, unspecified: Secondary | ICD-10-CM | POA: Diagnosis not present

## 2020-02-26 DIAGNOSIS — Z5111 Encounter for antineoplastic chemotherapy: Secondary | ICD-10-CM | POA: Diagnosis not present

## 2020-02-26 LAB — CMP (CANCER CENTER ONLY)
ALT: 21 U/L (ref 0–44)
AST: 19 U/L (ref 15–41)
Albumin: 3.6 g/dL (ref 3.5–5.0)
Alkaline Phosphatase: 90 U/L (ref 38–126)
Anion gap: 8 (ref 5–15)
BUN: 15 mg/dL (ref 8–23)
CO2: 26 mmol/L (ref 22–32)
Calcium: 9.3 mg/dL (ref 8.9–10.3)
Chloride: 107 mmol/L (ref 98–111)
Creatinine: 0.85 mg/dL (ref 0.61–1.24)
GFR, Est AFR Am: >60 mL/min (ref >60)
GFR, Estimated: >60 mL/min (ref >60)
Glucose, Bld: 111 mg/dL — ABNORMAL HIGH (ref 70–99)
Potassium: 3.7 mmol/L (ref 3.5–5.1)
Sodium: 141 mmol/L (ref 135–145)
Total Bilirubin: 0.2 mg/dL — ABNORMAL LOW (ref 0.3–1.2)
Total Protein: 6.3 g/dL — ABNORMAL LOW (ref 6.5–8.1)

## 2020-02-26 LAB — CBC WITH DIFFERENTIAL (CANCER CENTER ONLY)
Abs Immature Granulocytes: 0.02 10*3/uL (ref 0.00–0.07)
Basophils Absolute: 0.1 10*3/uL (ref 0.0–0.1)
Basophils Relative: 2 %
Eosinophils Absolute: 0.1 10*3/uL (ref 0.0–0.5)
Eosinophils Relative: 2 %
HCT: 39.4 % (ref 39.0–52.0)
Hemoglobin: 12.9 g/dL — ABNORMAL LOW (ref 13.0–17.0)
Immature Granulocytes: 0 %
Lymphocytes Relative: 39 %
Lymphs Abs: 2.4 10*3/uL (ref 0.7–4.0)
MCH: 28.9 pg (ref 26.0–34.0)
MCHC: 32.7 g/dL (ref 30.0–36.0)
MCV: 88.1 fL (ref 80.0–100.0)
Monocytes Absolute: 1 10*3/uL (ref 0.1–1.0)
Monocytes Relative: 16 %
Neutro Abs: 2.6 10*3/uL (ref 1.7–7.7)
Neutrophils Relative %: 41 %
Platelet Count: 295 10*3/uL (ref 150–400)
RBC: 4.47 MIL/uL (ref 4.22–5.81)
RDW: 14.5 % (ref 11.5–15.5)
WBC Count: 6.2 10*3/uL (ref 4.0–10.5)
nRBC: 0 % (ref 0.0–0.2)

## 2020-02-26 LAB — LACTATE DEHYDROGENASE: LDH: 157 U/L (ref 98–192)

## 2020-02-26 MED ORDER — SODIUM CHLORIDE 0.9 % IV SOLN
375.0000 mg/m2 | Freq: Once | INTRAVENOUS | Status: AC
  Administered 2020-02-26: 920 mg via INTRAVENOUS
  Filled 2020-02-26: qty 92

## 2020-02-26 MED ORDER — DEXAMETHASONE SODIUM PHOSPHATE 10 MG/ML IJ SOLN
10.0000 mg | Freq: Once | INTRAMUSCULAR | Status: AC
  Administered 2020-02-26: 10 mg via INTRAVENOUS

## 2020-02-26 MED ORDER — VINBLASTINE SULFATE CHEMO INJECTION 1 MG/ML
6.1500 mg/m2 | Freq: Once | INTRAVENOUS | Status: AC
  Administered 2020-02-26: 15 mg via INTRAVENOUS
  Filled 2020-02-26: qty 15

## 2020-02-26 MED ORDER — HEPARIN SOD (PORK) LOCK FLUSH 100 UNIT/ML IV SOLN
500.0000 [IU] | Freq: Once | INTRAVENOUS | Status: DC | PRN
  Filled 2020-02-26: qty 5

## 2020-02-26 MED ORDER — SODIUM CHLORIDE 0.9 % IV SOLN
150.0000 mg | Freq: Once | INTRAVENOUS | Status: AC
  Administered 2020-02-26: 150 mg via INTRAVENOUS
  Filled 2020-02-26: qty 150

## 2020-02-26 MED ORDER — DOXORUBICIN HCL CHEMO IV INJECTION 2 MG/ML
25.0000 mg/m2 | Freq: Once | INTRAVENOUS | Status: AC
  Administered 2020-02-26: 62 mg via INTRAVENOUS
  Filled 2020-02-26: qty 31

## 2020-02-26 MED ORDER — SODIUM CHLORIDE 0.9% FLUSH
10.0000 mL | INTRAVENOUS | Status: DC | PRN
  Administered 2020-02-26: 10 mL
  Filled 2020-02-26: qty 10

## 2020-02-26 MED ORDER — PALONOSETRON HCL INJECTION 0.25 MG/5ML
0.2500 mg | Freq: Once | INTRAVENOUS | Status: AC
  Administered 2020-02-26: 0.25 mg via INTRAVENOUS

## 2020-02-26 MED ORDER — SODIUM CHLORIDE 0.9 % IV SOLN
Freq: Once | INTRAVENOUS | Status: AC
  Filled 2020-02-26: qty 250

## 2020-02-26 MED ORDER — DEXAMETHASONE SODIUM PHOSPHATE 10 MG/ML IJ SOLN
INTRAMUSCULAR | Status: AC
  Filled 2020-02-26: qty 1

## 2020-02-26 MED ORDER — HEPARIN SOD (PORK) LOCK FLUSH 100 UNIT/ML IV SOLN
500.0000 [IU] | Freq: Once | INTRAVENOUS | Status: AC | PRN
  Administered 2020-02-26: 500 [IU]
  Filled 2020-02-26: qty 5

## 2020-02-26 MED ORDER — PALONOSETRON HCL INJECTION 0.25 MG/5ML
INTRAVENOUS | Status: AC
  Filled 2020-02-26: qty 5

## 2020-02-26 NOTE — Patient Instructions (Signed)
[facility] Discharge Instructions for Patients Receiving Chemotherapy  Today you received the following chemotherapy agents Adriamycin,Vinblastine,Dacarbazine  To help prevent nausea and vomiting after your treatment, we encourage you to take your nausea medication as directed.   If you develop nausea and vomiting that is not controlled by your nausea medication, call the clinic.   BELOW ARE SYMPTOMS THAT SHOULD BE REPORTED IMMEDIATELY:  *FEVER GREATER THAN 100.5 F  *CHILLS WITH OR WITHOUT FEVER  NAUSEA AND VOMITING THAT IS NOT CONTROLLED WITH YOUR NAUSEA MEDICATION  *UNUSUAL SHORTNESS OF BREATH  *UNUSUAL BRUISING OR BLEEDING  TENDERNESS IN MOUTH AND THROAT WITH OR WITHOUT PRESENCE OF ULCERS  *URINARY PROBLEMS  *BOWEL PROBLEMS  UNUSUAL RASH Items with * indicate a potential emergency and should be followed up as soon as possible.  Feel free to call the clinic should you have any questions or concerns. The clinic phone number is [phone].  Please show the CHEMO ALERT CARD at check-in to the Emergency Department and triage nurse.   

## 2020-02-26 NOTE — Progress Notes (Signed)
[facility] [phone]   [phone]  PROGRESS NOTE  Patient Care Team: [name], MD as PCP - General (Internal Medicine)  Hematological/Oncological History # Classical Hodgkin's Lymphoma, Stage II 1)10/10/2019: CT scan for right shoulder pain showed increased right axillary lymphadenopathy.  2) 12/10/2019: patient underwent US of the right axilla, noted to have right axillary lymphadenopathy 3) 12/15/2019: [place] axillary lymph node biopsy. Pathology confirms Classical Hodgkin's lymphoma 4) 12/24/2019: Establish care with Dr. Lorenso Courier 5)  12/31/2019: PET CT scan showed signs of right axillary adenopathy with increased metabolic activity 6) [id number]: Cycle 1 Day 1 of AVD chemotherapy (Bleomycin to be held for the duration of treatment) 7) 01/29/2020: Cycle 1 Day 14 of AVD chemotherapy  8) 02/12/2020: Cycle 2 Day 1 of AVD chemotherapy  9) 02/26/2020: Cycle 2 Day 15 of AVD chemotherapy   Interval History:  Marc Watson 64 y.o. male with medical history significant for classical Hodgkin Lymphoma who presents for a follow up visit. The patient's last visit was on 02/12/2020. Today the patient will start Cycle 2 Day 15 of AVD chemotherapy.   On exam today Marc Watson notes that he feels well.  He notes he has had some mild fatigue but overall his energy levels have been quite good.  He reports that his appetite has remained intact and his weight is stable.  He has not had any further issues with nausea and has never had any vomiting throughout the course of this disease.  Today he denies having any chest pain or shortness of breath.  Overall he notes he is tolerating therapy well other than some itching that he has on day 1 and 2 of therapy, which has resolved with chewable Benadryl. A full 10 point ROS is listed below.   On further discussion the Marc Watson was hoping to discuss the Covid vaccine.  We discussed the risks and benefits of the vaccine and my concern  that in patients receiving treatment for lymphoma their response to the vaccine may be muted.  I also noted that as long as he maintains strong precautions to prevent from being infected with the virus we would recommend he get the injection following completion of his chemotherapy, what it is most likely to be effective in protecting him from the severe side effects of the infection.   MEDICAL HISTORY:  Past Medical History:  Diagnosis Date  . Arthritis   . COPD (chronic obstructive pulmonary disease) ([place])    per CXR on 11/11/2019  . GERD (gastroesophageal reflux disease)    occ  . Hypertension    recently started taking on 11/05/2019   ALLERGIES:  is allergic to no known allergies.  MEDICATIONS:  Current Outpatient Medications  Medication Sig Dispense Refill  . cyclobenzaprine (FLEXERIL) 10 MG tablet Take 10 mg by mouth at bedtime as needed for muscle spasms.     Marland Kitchen lidocaine-prilocaine (EMLA) cream Apply 1 application topically as needed. 30 g 1  . losartan (COZAAR) 50 MG tablet Take 50 mg by mouth daily.    . naproxen (NAPROSYN) 250 MG tablet Take by mouth 2 (two) times daily with a meal. As needed    . Omega-3 Fatty Acids (FISH OIL PO) Take 2 capsules by mouth daily. OMEGA XL PROPRIETARY BLEND 300 MG d-ALPHA TOCOPHEROL (VITAMIN E)/EXTRA VIRGIN OLIVE OIL/EXTRACT ([id number]) CONTAINING OMEGA FATTY ACIDS, GREEN LIPPED MUSSEL (PERNA CANLICULUS) OIL)     . omeprazole (PRILOSEC) 40 MG capsule Take 1 capsule (40 mg total) by mouth  daily. 90 capsule 3  . ondansetron (ZOFRAN) 8 MG tablet Take 1 tablet (8 mg total) by mouth every 8 (eight) hours as needed for nausea or vomiting. 30 tablet 1  . prochlorperazine (COMPAZINE) 10 MG tablet Take 1 tablet (10 mg total) by mouth every 6 (six) hours as needed for nausea or vomiting. 30 tablet 1   No current facility-administered medications for this visit.    REVIEW OF SYSTEMS:   Constitutional: ( - ) fevers, ( - )  chills , ( - ) night sweats (+)  itching Eyes: ( - ) blurriness of vision, ( - ) double vision, ( - ) watery eyes Ears, nose, mouth, throat, and face: ( - ) mucositis, ( - ) sore throat Respiratory: ( - ) cough, ( - ) dyspnea, ( - ) wheezes Cardiovascular: ( - ) palpitation, ( - ) chest discomfort, ( - ) lower extremity swelling Gastrointestinal:  ( - ) nausea, ( - ) heartburn, ( - ) change in bowel habits Skin: ( - ) abnormal skin rashes Lymphatics: ( - ) new lymphadenopathy, ( - ) easy bruising Neurological: ( - ) numbness, ( - ) tingling, ( - ) new weaknesses Behavioral/Psych: ( - ) mood change, ( - ) new changes  All other systems were reviewed with the patient and are negative.  PHYSICAL EXAMINATION: ECOG PERFORMANCE STATUS: 0 - Asymptomatic  Vitals:   02/26/20 1048  BP: 136/86  Pulse: 83  Resp: 18  Temp: 98.7 F (37.1 C)  SpO2: 98%   Filed Weights   02/26/20 1048  Weight: 264 lb 14.4 oz (120.2 kg)    GENERAL: well appearing middle aged Caucasian male in NAD  SKIN: skin color, texture, turgor are normal, no rashes or significant lesions EYES: conjunctiva are pink and non-injected, sclera clear LUNGS: clear to auscultation and percussion with normal breathing effort HEART: regular rate & rhythm and no murmurs and no lower extremity edema Musculoskeletal: no cyanosis of digits and no clubbing  PSYCH: alert & oriented x 3, fluent speech NEURO: no focal motor/sensory deficits  LABORATORY DATA:  I have reviewed the data as listed CBC Latest Ref Rng & Units 02/26/2020 02/12/2020 02/05/2020  WBC 4.0 - 10.5 K/uL 6.2 8.3 5.9  Hemoglobin 13.0 - 17.0 g/dL 12.9(L) 13.8 13.0  Hematocrit 39.0 - 52.0 % 39.4 41.6 39.2  Platelets 150 - 400 K/uL 295 328 357    CMP Latest Ref Rng & Units 02/26/2020 02/12/2020 02/05/2020  Glucose 70 - 99 mg/dL 111(H) 105(H) 118(H)  BUN 8 - 23 mg/dL 15 17 17   Creatinine 0.61 - 1.24 mg/dL 0.85 0.83 0.92  Sodium 135 - 145 mmol/L 141 143 143  Potassium 3.5 - 5.1 mmol/L 3.7 4.1 3.9    Chloride 98 - 111 mmol/L 107 110 110  CO2 22 - 32 mmol/L 26 24 25   Calcium 8.9 - 10.3 mg/dL 9.3 9.3 9.5  Total Protein 6.5 - 8.1 g/dL 6.3(L) 7.0 6.9  Total Bilirubin 0.3 - 1.2 mg/dL 0.2(L) <0.2(L) <0.2(L)  Alkaline Phos 38 - 126 U/L 90 102 101  AST 15 - 41 U/L 19 22 18   ALT 0 - 44 U/L 21 30 32     RADIOGRAPHIC STUDIES: I have personally reviewed the radiological images as listed and agreed with the findings in the report: FDG avidity in the left axilla with increased lymphadenopathy in the chest, no increase in FDG (reviewed prior to treatment).  No results found.  ASSESSMENT & PLAN Marc Watson  64 y.o. male with medical history significant for classical Hodgkin Lymphoma who presents for a follow up visit. He completed Cycle 1 of therapy and now presents for Cycle 2 Day 15 treatment.   Overall Mr. Macdonell is tolerating therapy well with minimal side effects including some continued itching on Day 1 and 2, mild nausea (no vomiting), and gas.  Today his labs continue to be robust and he has no concerns moving about forward.  I think it is appropriate to continue with AVD chemotherapy as planned.  The current plan is for AVD chemotherapy x 2 cycles then reassessment with PET CT scan (scheduled for 03/08/2020). The regimen consists of doxorubicin 25mg /m2, vinblastine 6mg /m2, and dacarbazine 375mg /m2 on Day 1 and Day 15. Bleomycin will be held on concern for his lung function and for his age. He received Cycle 1 Day 1 on 01/16/2020.   IPS Score For Hodgkin Lymphoma: 2 (81% OS, 67% freedom from progression)   # Classical Hodgkin's Lymphoma, Stage II (Nonbulky, Favorable) --continue AVD chemotherapy (with no bleomycin). Today is Cycle 2 Day 15  --after completion of 2 full cycles will plan for a repeat PET scan to determine next course of action (duration of remaining chemo vs rads). Will schedule PET CT for the week of 03/08/2020.  --patient has undergone appropriate pre-treatment testing  including TTE, port placement, and blood work. PFTs were not able to be performed due to issues with scheduling during the [place] pandemic. Bleomycin held due to age and concern for underlying lung disease --stable labs, can check q 2 weeks on chemotherapy days.  --plan for RTC on 03/11/2020 prior to Cycle 3 Day 1 dose of chemotherapy.   #Thyroid Nodule --found incidentally on last PET CT scan on 12/31/2019 --may require ultrasound to further evaluate --continue to monitor   #Itching, stable --continue benadryl PRN --patient notes he has some itching on day 1 and 2 of treatment, but not as severe as intial episode.  --call for worsening or intractable itching  #Symptom Management --patient provided with PRN medications for zofran and compazine for nausea  --recommend omeprazole for heartburn, stomach upset. Patient can use OTC simethicone for excessive gas.  --EMLA cream provided for port  --will continue to monitor through course of treatment   No orders of the defined types were placed in this encounter.  All questions were answered. The patient knows to call the clinic with any problems, questions or concerns.  A total of more than 30 minutes were spent on this encounter and over half of that time was spent on counseling and coordination of care as outlined above.   [name], MD Department of Hematology/Oncology [place] at [facility] Phone: [phone] Pager: [phone] Email: [email or web address]  02/26/2020 11:47 AM

## 2020-02-27 ENCOUNTER — Telehealth: Payer: Self-pay | Admitting: Hematology and Oncology

## 2020-02-27 NOTE — Telephone Encounter (Signed)
Scheduled per los. Called and spoke with patient. Confirmed appt 

## 2020-03-01 ENCOUNTER — Ambulatory Visit (HOSPITAL_COMMUNITY): Payer: BC Managed Care – PPO

## 2020-03-05 NOTE — Progress Notes (Signed)
Pharmacist Chemotherapy Monitoring - Follow Up Assessment    I verify that I have reviewed each item in the below checklist:  . Regimen for the patient is scheduled for the appropriate day and plan matches scheduled date. Marland Kitchen Appropriate non-routine labs are ordered dependent on drug ordered. . If applicable, additional medications reviewed and ordered per protocol based on lifetime cumulative doses and/or treatment regimen.   Plan for follow-up and/or issues identified: No . I-vent associated with next due treatment: No . MD and/or nursing notified: No  Philomena Course 03/05/2020 3:45 PM

## 2020-03-08 ENCOUNTER — Other Ambulatory Visit (HOSPITAL_COMMUNITY): Payer: BC Managed Care – PPO

## 2020-03-08 ENCOUNTER — Ambulatory Visit (HOSPITAL_COMMUNITY)
Admission: RE | Admit: 2020-03-08 | Discharge: 2020-03-08 | Disposition: A | Discharge status: Home / Self Care | Payer: BC Managed Care – PPO | Source: Ambulatory Visit | Attending: Hematology and Oncology | Admitting: Hematology and Oncology

## 2020-03-08 ENCOUNTER — Other Ambulatory Visit: Payer: Self-pay

## 2020-03-08 DIAGNOSIS — C8104 Nodular lymphocyte predominant Hodgkin lymphoma, lymph nodes of axilla and upper limb: Secondary | ICD-10-CM | POA: Insufficient documentation

## 2020-03-08 DIAGNOSIS — C8190 Hodgkin lymphoma, unspecified, unspecified site: Secondary | ICD-10-CM | POA: Diagnosis not present

## 2020-03-08 LAB — GLUCOSE, CAPILLARY: Glucose-Capillary: 91 mg/dL (ref 70–99)

## 2020-03-08 MED ORDER — FLUDEOXYGLUCOSE F - 18 (FDG) INJECTION
13.2000 | Freq: Once | INTRAVENOUS | Status: AC | PRN
  Administered 2020-03-08: 08:00:00 13.2 via INTRAVENOUS

## 2020-03-10 NOTE — Progress Notes (Signed)
[facility] [phone]   [phone]  PROGRESS NOTE  Patient Care Team: [name], MD as PCP - General (Internal Medicine)  Hematological/Oncological History # Classical Hodgkin's Lymphoma, Stage II 1)10/10/2019: CT scan for right shoulder pain showed increased right axillary lymphadenopathy.  2) 12/10/2019: patient underwent US of the right axilla, noted to have right axillary lymphadenopathy 3) 12/15/2019: [place] axillary lymph node biopsy. Pathology confirms Classical Hodgkin's lymphoma 4) 12/24/2019: Establish care with Dr. Lorenso Courier 5)  12/31/2019: PET CT scan showed signs of right axillary adenopathy with increased metabolic activity 6) [id number]: Cycle 1 Day 1 of AVD chemotherapy (Bleomycin to be held for the duration of treatment) 7) 01/29/2020: Cycle 1 Day 14 of AVD chemotherapy  8) 02/12/2020: Cycle 2 Day 1 of AVD chemotherapy  9) 02/26/2020: Cycle 2 Day 15 of AVD chemotherapy  10) 03/11/2020: Cycle 3 Day 1 of AVD chemotherapy   Interval History:  Marc Watson 64 y.o. male with medical history significant for classical Hodgkin Lymphoma who presents for a follow up visit. The patient's last visit was on 02/12/2020. Today the patient will start Cycle 3 Day 1 of AVD chemotherapy.   On exam today Marc Watson notes continues to have some nausea following chemotherapy, but has not had any issues with appetite or vomiting.  He reports that he does have itching on day 1 and 2 of treatment, but that Benadryl therapy has been working to alleviate that.  Otherwise he has had no concerns or complaints.  He reports that he has not had any issues with fevers, chills, sweats, nausea, vomiting or diarrhea.  He notes that he has been taking appropriate precautions to prevent Covid infection.  Overall he has no major questions or concerns regarding continuation of therapy today.  His wife has accompanied him and all questions have been answered to her satisfaction.  A full 10  point ROS is listed below.  MEDICAL HISTORY:  Past Medical History:  Diagnosis Date  . Arthritis   . COPD (chronic obstructive pulmonary disease) ([place])    per CXR on 11/11/2019  . GERD (gastroesophageal reflux disease)    occ  . Hypertension    recently started taking on 11/05/2019   ALLERGIES:  is allergic to no known allergies.  MEDICATIONS:  Current Outpatient Medications  Medication Sig Dispense Refill  . cyclobenzaprine (FLEXERIL) 10 MG tablet Take 10 mg by mouth at bedtime as needed for muscle spasms.     Marland Kitchen lidocaine-prilocaine (EMLA) cream Apply 1 application topically as needed. 30 g 1  . losartan (COZAAR) 50 MG tablet Take 50 mg by mouth daily.    . naproxen (NAPROSYN) 250 MG tablet Take by mouth 2 (two) times daily with a meal. As needed    . Omega-3 Fatty Acids (FISH OIL PO) Take 2 capsules by mouth daily. OMEGA XL PROPRIETARY BLEND 300 MG d-ALPHA TOCOPHEROL (VITAMIN E)/EXTRA VIRGIN OLIVE OIL/EXTRACT ([id number]) CONTAINING OMEGA FATTY ACIDS, GREEN LIPPED MUSSEL (PERNA CANLICULUS) OIL)     . omeprazole (PRILOSEC) 40 MG capsule Take 1 capsule (40 mg total) by mouth daily. 90 capsule 3  . ondansetron (ZOFRAN) 8 MG tablet Take 1 tablet (8 mg total) by mouth every 8 (eight) hours as needed for nausea or vomiting. 30 tablet 1  . prochlorperazine (COMPAZINE) 10 MG tablet Take 1 tablet (10 mg total) by mouth every 6 (six) hours as needed for nausea or vomiting. 30 tablet 1   No current facility-administered medications for this visit.  Facility-Administered Medications Ordered in Other Visits  Medication Dose Route Frequency Provider Last Rate Last Admin  . dacarbazine (DTIC) 920 mg in sodium chloride 0.9 % 250 mL chemo infusion  375 mg/m2 (Treatment Plan Recorded) Intravenous Once Orson Slick, MD      . dexamethasone (DECADRON) injection 10 mg  10 mg Intravenous Once Orson Slick, MD      . DOXOrubicin (ADRIAMYCIN) chemo injection 62 mg  25 mg/m2 (Treatment Plan  Recorded) Intravenous Once Orson Slick, MD      . fosaprepitant (EMEND) 150 mg in sodium chloride 0.9 % 145 mL IVPB  150 mg Intravenous Once Narda Rutherford T IV, MD      . heparin lock flush 100 unit/mL  500 Units Intracatheter Once PRN Orson Slick, MD      . palonosetron (ALOXI) injection 0.25 mg  0.25 mg Intravenous Once Narda Rutherford T IV, MD      . sodium chloride flush (NS) 0.9 % injection 10 mL  10 mL Intracatheter PRN [name] IV, MD      . vinBLAStine (VELBAN) 15 mg in sodium chloride 0.9 % 50 mL chemo infusion  6.15 mg/m2 (Treatment Plan Recorded) Intravenous Once Orson Slick, MD        REVIEW OF SYSTEMS:   Constitutional: ( - ) fevers, ( - )  chills , ( - ) night sweats (+) itching Eyes: ( - ) blurriness of vision, ( - ) double vision, ( - ) watery eyes Ears, nose, mouth, throat, and face: ( - ) mucositis, ( - ) sore throat Respiratory: ( - ) cough, ( - ) dyspnea, ( - ) wheezes Cardiovascular: ( - ) palpitation, ( - ) chest discomfort, ( - ) lower extremity swelling Gastrointestinal:  ( - ) nausea, ( - ) heartburn, ( - ) change in bowel habits Skin: ( - ) abnormal skin rashes Lymphatics: ( - ) new lymphadenopathy, ( - ) easy bruising Neurological: ( - ) numbness, ( - ) tingling, ( - ) new weaknesses Behavioral/Psych: ( - ) mood change, ( - ) new changes  All other systems were reviewed with the patient and are negative.  PHYSICAL EXAMINATION: ECOG PERFORMANCE STATUS: 0 - Asymptomatic  Vitals:   03/11/20 1107 03/11/20 1143  BP: (!) 162/97 (!) 139/92  Pulse: 74   Resp: 20   Temp: 98.7 F (37.1 C)   SpO2: 99%    Filed Weights   03/11/20 1107  Weight: 266 lb (120.7 kg)    GENERAL: well appearing middle aged Caucasian male in NAD  SKIN: skin color, texture, turgor are normal, no rashes or significant lesions EYES: conjunctiva are pink and non-injected, sclera clear LUNGS: clear to auscultation and percussion with normal breathing effort HEART:  regular rate & rhythm and no murmurs and no lower extremity edema Musculoskeletal: no cyanosis of digits and no clubbing  PSYCH: alert & oriented x 3, fluent speech NEURO: no focal motor/sensory deficits  LABORATORY DATA:  I have reviewed the data as listed CBC Latest Ref Rng & Units 03/11/2020 02/26/2020 02/12/2020  WBC 4.0 - 10.5 K/uL 8.3 6.2 8.3  Hemoglobin 13.0 - 17.0 g/dL 12.4(L) 12.9(L) 13.8  Hematocrit 39.0 - 52.0 % 37.5(L) 39.4 41.6  Platelets 150 - 400 K/uL 323 295 328    CMP Latest Ref Rng & Units 03/11/2020 02/26/2020 02/12/2020  Glucose 70 - 99 mg/dL 107(H) 111(H) 105(H)  BUN 8 - 23 mg/dL  14 15 17   Creatinine 0.61 - 1.24 mg/dL 1.09 0.85 0.83  Sodium 135 - 145 mmol/L 141 141 143  Potassium 3.5 - 5.1 mmol/L 3.8 3.7 4.1  Chloride 98 - 111 mmol/L 108 107 110  CO2 22 - 32 mmol/L 24 26 24   Calcium 8.9 - 10.3 mg/dL 9.5 9.3 9.3  Total Protein 6.5 - 8.1 g/dL 6.6 6.3(L) 7.0  Total Bilirubin 0.3 - 1.2 mg/dL <0.2(L) 0.2(L) <0.2(L)  Alkaline Phos 38 - 126 U/L 87 90 102  AST 15 - 41 U/L 24 19 22   ALT 0 - 44 U/L 42 21 30     RADIOGRAPHIC STUDIES: I have personally reviewed the radiological images as listed and agreed with the findings in the report: marked improvement in right axillary FDG avidity. Some residual activity in the neck/AP window.   NM PET Image Restag (PS) Skull Base To Thigh  Result Date: 03/08/2020 CLINICAL DATA:  Subsequent treatment strategy for Hodgkin's lymphoma. EXAM: NUCLEAR MEDICINE PET SKULL BASE TO THIGH TECHNIQUE: 13.2 mCi F-18 FDG was injected intravenously. Full-ring PET imaging was performed from the skull base to thigh after the radiotracer. CT data was obtained and used for attenuation correction and anatomic localization. Fasting blood glucose: 91 mg/dl COMPARISON:  12/31/2019 FINDINGS: Mediastinal blood pool activity: SUV max 2.4 Liver activity: SUV max 3.7 NECK: Frontal activity appears symmetric and is significantly reduced from prior, maximum SUV 4.0 on the  right (formerly 8.4) and 3.9 on the left (previously 7.6). This may well be physiologic and currently measures at low Deauville 4 levels. Previous index left level II lymph node measures 0.6 cm in short axis on image 26/4 (formerly 1.1 cm by my measurements) with a maximum SUV of 1.2 (formerly 2.9). This is currently Deauville 2 activity. 1.8 by 1.1 cm left inferior thyroid nodule without accentuated metabolic activity. No follow-up recommended (ref: J Am Coll Radiol. 2015 Feb;12(2): 143-50). Incidental CT findings: Mild chronic ethmoid sinusitis. CHEST: The previous dominant hypermetabolic right axillary lymph nodes have completely resolved (Deauville 1). 1.5 cm in short axis AP window lymph node on image 72/4, stable in size, maximum SUV 2.0 (Deauville 2), previously 2.9. No left axillary adenopathy. Incidental CT findings: Right Port-A-Cath tip: SVC. Coronary, aortic arch, and branch vessel atherosclerotic vascular disease. ABDOMEN/PELVIS: No significant abnormal hypermetabolic activity in this region. Incidental CT findings: No splenomegaly. 2.4 by 1.7 cm left adrenal adenoma. Aortoiliac atherosclerotic vascular disease. Small periampullary duodenal diverticulum. SKELETON: Accentuated activity along the right proximal humeral joint capsule, thought to be incidental/physiologic. Right total shoulder prosthesis. Incidental CT findings: Left total hip prosthesis. Lumbar spondylosis and degenerative disc disease likely causing multilevel impingement. IMPRESSION: 1. Significant improvement, with complete resolution of the right axillary adenopathy (the larger previous dominant lymph nodes are currently no longer present compatible with Deauville 1 disease); reduced activity and size of lymph nodes in the neck, currently Deauville 2; and Deauville 2 activity in the AP window lymph node. No splenomegaly. 2. Reduced tonsillar activity bilaterally, likely physiologic. 3. Other imaging findings of potential clinical  significance: Left adrenal adenoma. Aortic Atherosclerosis (ICD10-I70.0). Coronary atherosclerosis. Probable multilevel lumbar impingement. Electronically Signed   By: Van Clines M.D.   On: 03/08/2020 10:15    ASSESSMENT & PLAN Marc Watson 64 y.o. male with medical history significant for classical Hodgkin Lymphoma who presents for a follow up visit. He completed Cycle 1 of therapy and now presents for Cycle 3 Day 1 treatment.   Overall Marc Watson is tolerating therapy  well with minimal side effects including some continued itching on Day 1 and 2, mild nausea (no vomiting).  His labs continue to be stable (with a minor drop in Hgb today) and he has no concerns moving about forward.  I think it is appropriate to continue with AVD chemotherapy as planned.  The current plan is for AVD chemotherapy x 4 total cycles and refer for consideration of ISRT. After all treatments would reassess with PET CT scan (recommended within 3 months of completion of therapy). The regimen consists of doxorubicin 25mg /m2, vinblastine 6mg /m2, and dacarbazine 375mg /m2 on Day 1 and Day 15. Bleomycin will be held on concern for his lung function and for his age. He received Cycle 1 Day 1 on 01/16/2020.   IPS Score For Hodgkin Lymphoma: 2 (81% OS, 67% freedom from progression)  # Classical Hodgkin's Lymphoma, Stage II (Nonbulky, Favorable) --continue AVD chemotherapy (with no bleomycin). Today is Cycle 3 Day 1  --restaging post Cycle 2 PET scan performed 03/08/2020, showed significant improvement, with complete resolution of the right axillary adenopathy (the larger previous dominant lymph nodes are currently no longer present compatible with Deauville 1 disease); reduced activity and size of lymph nodes in the neck, currently Deauville 2; and Deauville 2 activity in the AP window lymph node. --patient has undergone appropriate pre-treatment testing including TTE, port placement, and blood work. PFTs were not able to  be performed due to issues with scheduling during the [place] pandemic. Bleomycin held due to age and concern for underlying lung disease --stable labs, can check q 2 weeks on chemotherapy days.  --plan for RTC on 03/25/2020 prior to Cycle 3 Day 15 dose of chemotherapy.   #Thyroid Nodule, stable --found incidentally on last PET CT scan on 12/31/2019 --no further imaging f/u required per recommendations on last PET CT --continue to monitor   #Itching, stable --continue benadryl PRN --patient notes he has some itching on day 1 and 2 of treatment, but not as severe as intial episode.  --call for worsening or intractable itching  #Symptom Management --patient provided with PRN medications for zofran and compazine for nausea  --recommend omeprazole for heartburn, stomach upset. Patient can use OTC simethicone for excessive gas.  --EMLA cream provided for port  --will continue to monitor through course of treatment   No orders of the defined types were placed in this encounter.  All questions were answered. The patient knows to call the clinic with any problems, questions or concerns.  A total of more than 30 minutes were spent on this encounter and over half of that time was spent on counseling and coordination of care as outlined above.   [name], MD Department of Hematology/Oncology [place] at [facility] Phone: [phone] Pager: [phone] Email: [email or web address]  03/11/2020 12:47 PM

## 2020-03-11 ENCOUNTER — Other Ambulatory Visit: Payer: Self-pay

## 2020-03-11 ENCOUNTER — Inpatient Hospital Stay: Payer: BC Managed Care – PPO

## 2020-03-11 ENCOUNTER — Ambulatory Visit: Payer: BC Managed Care – PPO | Admitting: Hematology and Oncology

## 2020-03-11 ENCOUNTER — Encounter: Payer: Self-pay | Admitting: Hematology and Oncology

## 2020-03-11 ENCOUNTER — Inpatient Hospital Stay: Payer: BC Managed Care – PPO | Attending: Hematology and Oncology | Admitting: Hematology and Oncology

## 2020-03-11 VITALS — BP 139/92 | HR 74 | Temp 98.7°F | Resp 20 | Ht 73.0 in | Wt 266.0 lb

## 2020-03-11 DIAGNOSIS — K219 Gastro-esophageal reflux disease without esophagitis: Secondary | ICD-10-CM | POA: Insufficient documentation

## 2020-03-11 DIAGNOSIS — Z7952 Long term (current) use of systemic steroids: Secondary | ICD-10-CM | POA: Diagnosis not present

## 2020-03-11 DIAGNOSIS — Z96611 Presence of right artificial shoulder joint: Secondary | ICD-10-CM | POA: Insufficient documentation

## 2020-03-11 DIAGNOSIS — Z5111 Encounter for antineoplastic chemotherapy: Secondary | ICD-10-CM | POA: Insufficient documentation

## 2020-03-11 DIAGNOSIS — Z79899 Other long term (current) drug therapy: Secondary | ICD-10-CM | POA: Diagnosis not present

## 2020-03-11 DIAGNOSIS — J449 Chronic obstructive pulmonary disease, unspecified: Secondary | ICD-10-CM | POA: Diagnosis not present

## 2020-03-11 DIAGNOSIS — Z5112 Encounter for antineoplastic immunotherapy: Secondary | ICD-10-CM | POA: Diagnosis not present

## 2020-03-11 DIAGNOSIS — C8174 Other classical Hodgkin lymphoma, lymph nodes of axilla and upper limb: Secondary | ICD-10-CM | POA: Diagnosis not present

## 2020-03-11 DIAGNOSIS — Z95828 Presence of other vascular implants and grafts: Secondary | ICD-10-CM

## 2020-03-11 DIAGNOSIS — I10 Essential (primary) hypertension: Secondary | ICD-10-CM | POA: Insufficient documentation

## 2020-03-11 DIAGNOSIS — C8104 Nodular lymphocyte predominant Hodgkin lymphoma, lymph nodes of axilla and upper limb: Secondary | ICD-10-CM | POA: Diagnosis not present

## 2020-03-11 DIAGNOSIS — E041 Nontoxic single thyroid nodule: Secondary | ICD-10-CM | POA: Diagnosis not present

## 2020-03-11 DIAGNOSIS — Z791 Long term (current) use of non-steroidal anti-inflammatories (NSAID): Secondary | ICD-10-CM | POA: Diagnosis not present

## 2020-03-11 LAB — CBC WITH DIFFERENTIAL (CANCER CENTER ONLY)
Abs Immature Granulocytes: 0.04 10*3/uL (ref 0.00–0.07)
Basophils Absolute: 0.1 10*3/uL (ref 0.0–0.1)
Basophils Relative: 1 %
Eosinophils Absolute: 0.2 10*3/uL (ref 0.0–0.5)
Eosinophils Relative: 2 %
HCT: 37.5 % — ABNORMAL LOW (ref 39.0–52.0)
Hemoglobin: 12.4 g/dL — ABNORMAL LOW (ref 13.0–17.0)
Immature Granulocytes: 1 %
Lymphocytes Relative: 26 %
Lymphs Abs: 2.2 10*3/uL (ref 0.7–4.0)
MCH: 28.8 pg (ref 26.0–34.0)
MCHC: 33.1 g/dL (ref 30.0–36.0)
MCV: 87 fL (ref 80.0–100.0)
Monocytes Absolute: 1 10*3/uL (ref 0.1–1.0)
Monocytes Relative: 12 %
Neutro Abs: 4.8 10*3/uL (ref 1.7–7.7)
Neutrophils Relative %: 58 %
Platelet Count: 323 10*3/uL (ref 150–400)
RBC: 4.31 MIL/uL (ref 4.22–5.81)
RDW: 14.7 % (ref 11.5–15.5)
WBC Count: 8.3 10*3/uL (ref 4.0–10.5)
nRBC: 0 % (ref 0.0–0.2)

## 2020-03-11 LAB — CMP (CANCER CENTER ONLY)
ALT: 42 U/L (ref 0–44)
AST: 24 U/L (ref 15–41)
Albumin: 3.6 g/dL (ref 3.5–5.0)
Alkaline Phosphatase: 87 U/L (ref 38–126)
Anion gap: 9 (ref 5–15)
BUN: 14 mg/dL (ref 8–23)
CO2: 24 mmol/L (ref 22–32)
Calcium: 9.5 mg/dL (ref 8.9–10.3)
Chloride: 108 mmol/L (ref 98–111)
Creatinine: 1.09 mg/dL (ref 0.61–1.24)
GFR, Est AFR Am: >60 mL/min (ref >60)
GFR, Estimated: >60 mL/min (ref >60)
Glucose, Bld: 107 mg/dL — ABNORMAL HIGH (ref 70–99)
Potassium: 3.8 mmol/L (ref 3.5–5.1)
Sodium: 141 mmol/L (ref 135–145)
Total Bilirubin: <0.2 mg/dL — ABNORMAL LOW (ref 0.3–1.2)
Total Protein: 6.6 g/dL (ref 6.5–8.1)

## 2020-03-11 LAB — LACTATE DEHYDROGENASE: LDH: 204 U/L — ABNORMAL HIGH (ref 98–192)

## 2020-03-11 MED ORDER — PALONOSETRON HCL INJECTION 0.25 MG/5ML
0.2500 mg | Freq: Once | INTRAVENOUS | Status: AC
  Administered 2020-03-11: 0.25 mg via INTRAVENOUS

## 2020-03-11 MED ORDER — SODIUM CHLORIDE 0.9 % IV SOLN
375.0000 mg/m2 | Freq: Once | INTRAVENOUS | Status: AC
  Administered 2020-03-11: 920 mg via INTRAVENOUS
  Filled 2020-03-11: qty 92

## 2020-03-11 MED ORDER — PALONOSETRON HCL INJECTION 0.25 MG/5ML
INTRAVENOUS | Status: AC
  Filled 2020-03-11: qty 5

## 2020-03-11 MED ORDER — SODIUM CHLORIDE 0.9 % IV SOLN
150.0000 mg | Freq: Once | INTRAVENOUS | Status: AC
  Administered 2020-03-11: 150 mg via INTRAVENOUS
  Filled 2020-03-11: qty 150

## 2020-03-11 MED ORDER — HEPARIN SOD (PORK) LOCK FLUSH 100 UNIT/ML IV SOLN
500.0000 [IU] | Freq: Once | INTRAVENOUS | Status: AC | PRN
  Administered 2020-03-11: 16:00:00 500 [IU]
  Filled 2020-03-11: qty 5

## 2020-03-11 MED ORDER — DEXAMETHASONE SODIUM PHOSPHATE 10 MG/ML IJ SOLN
INTRAMUSCULAR | Status: AC
  Filled 2020-03-11: qty 1

## 2020-03-11 MED ORDER — SODIUM CHLORIDE 0.9% FLUSH
10.0000 mL | INTRAVENOUS | Status: DC | PRN
  Administered 2020-03-11: 10 mL
  Filled 2020-03-11: qty 10

## 2020-03-11 MED ORDER — SODIUM CHLORIDE 0.9 % IV SOLN
Freq: Once | INTRAVENOUS | Status: AC
  Filled 2020-03-11: qty 250

## 2020-03-11 MED ORDER — VINBLASTINE SULFATE CHEMO INJECTION 1 MG/ML
6.1500 mg/m2 | Freq: Once | INTRAVENOUS | Status: AC
  Administered 2020-03-11: 15 mg via INTRAVENOUS
  Filled 2020-03-11: qty 15

## 2020-03-11 MED ORDER — DOXORUBICIN HCL CHEMO IV INJECTION 2 MG/ML
25.0000 mg/m2 | Freq: Once | INTRAVENOUS | Status: AC
  Administered 2020-03-11: 62 mg via INTRAVENOUS
  Filled 2020-03-11: qty 31

## 2020-03-11 MED ORDER — DEXAMETHASONE SODIUM PHOSPHATE 10 MG/ML IJ SOLN
10.0000 mg | Freq: Once | INTRAMUSCULAR | Status: AC
  Administered 2020-03-11: 10 mg via INTRAVENOUS

## 2020-03-11 MED ORDER — HEPARIN SOD (PORK) LOCK FLUSH 100 UNIT/ML IV SOLN
500.0000 [IU] | Freq: Once | INTRAVENOUS | Status: DC | PRN
  Filled 2020-03-11: qty 5

## 2020-03-11 NOTE — Patient Instructions (Signed)
[facility] Discharge Instructions for Patients Receiving Chemotherapy  Today you received the following chemotherapy agents: Doxorubicin, Vinblastine, Dacarbazine  To help prevent nausea and vomiting after your treatment, we encourage you to take your nausea medication as directed.    If you develop nausea and vomiting that is not controlled by your nausea medication, call the clinic.   BELOW ARE SYMPTOMS THAT SHOULD BE REPORTED IMMEDIATELY:  *FEVER GREATER THAN 100.5 F  *CHILLS WITH OR WITHOUT FEVER  NAUSEA AND VOMITING THAT IS NOT CONTROLLED WITH YOUR NAUSEA MEDICATION  *UNUSUAL SHORTNESS OF BREATH  *UNUSUAL BRUISING OR BLEEDING  TENDERNESS IN MOUTH AND THROAT WITH OR WITHOUT PRESENCE OF ULCERS  *URINARY PROBLEMS  *BOWEL PROBLEMS  UNUSUAL RASH Items with * indicate a potential emergency and should be followed up as soon as possible.  Feel free to call the clinic should you have any questions or concerns. The clinic phone number is [phone].  Please show the [facility] at check-in to the Emergency Department and triage nurse.

## 2020-03-12 ENCOUNTER — Telehealth: Payer: Self-pay | Admitting: Hematology and Oncology

## 2020-03-12 NOTE — Telephone Encounter (Signed)
Scheduled per los. Called and left msg. Mailed printout  °

## 2020-03-19 NOTE — Progress Notes (Signed)
Pharmacist Chemotherapy Monitoring - Follow Up Assessment    I verify that I have reviewed each item in the below checklist:  . Regimen for the patient is scheduled for the appropriate day and plan matches scheduled date. Marland Kitchen Appropriate non-routine labs are ordered dependent on drug ordered. . If applicable, additional medications reviewed and ordered per protocol based on lifetime cumulative doses and/or treatment regimen.   Plan for follow-up and/or issues identified: No . I-vent associated with next due treatment: No . MD and/or nursing notified: No  Rahshawn Remo, Jacqlyn Larsen 03/19/2020 2:50 PM

## 2020-03-25 ENCOUNTER — Other Ambulatory Visit: Payer: Self-pay

## 2020-03-25 ENCOUNTER — Other Ambulatory Visit: Payer: Self-pay | Admitting: Hematology and Oncology

## 2020-03-25 ENCOUNTER — Inpatient Hospital Stay: Payer: BC Managed Care – PPO

## 2020-03-25 ENCOUNTER — Inpatient Hospital Stay (HOSPITAL_BASED_OUTPATIENT_CLINIC_OR_DEPARTMENT_OTHER): Payer: BC Managed Care – PPO | Admitting: Hematology and Oncology

## 2020-03-25 ENCOUNTER — Encounter: Payer: Self-pay | Admitting: Hematology and Oncology

## 2020-03-25 VITALS — BP 129/79 | HR 72 | Temp 98.2°F | Resp 20 | Ht 73.0 in | Wt 260.0 lb

## 2020-03-25 DIAGNOSIS — C8104 Nodular lymphocyte predominant Hodgkin lymphoma, lymph nodes of axilla and upper limb: Secondary | ICD-10-CM

## 2020-03-25 DIAGNOSIS — Z5111 Encounter for antineoplastic chemotherapy: Secondary | ICD-10-CM | POA: Diagnosis not present

## 2020-03-25 DIAGNOSIS — C8174 Other classical Hodgkin lymphoma, lymph nodes of axilla and upper limb: Secondary | ICD-10-CM | POA: Diagnosis not present

## 2020-03-25 DIAGNOSIS — J449 Chronic obstructive pulmonary disease, unspecified: Secondary | ICD-10-CM | POA: Diagnosis not present

## 2020-03-25 DIAGNOSIS — E041 Nontoxic single thyroid nodule: Secondary | ICD-10-CM | POA: Diagnosis not present

## 2020-03-25 DIAGNOSIS — Z96611 Presence of right artificial shoulder joint: Secondary | ICD-10-CM | POA: Diagnosis not present

## 2020-03-25 DIAGNOSIS — Z95828 Presence of other vascular implants and grafts: Secondary | ICD-10-CM

## 2020-03-25 DIAGNOSIS — K219 Gastro-esophageal reflux disease without esophagitis: Secondary | ICD-10-CM | POA: Diagnosis not present

## 2020-03-25 DIAGNOSIS — Z791 Long term (current) use of non-steroidal anti-inflammatories (NSAID): Secondary | ICD-10-CM | POA: Diagnosis not present

## 2020-03-25 DIAGNOSIS — Z7952 Long term (current) use of systemic steroids: Secondary | ICD-10-CM | POA: Diagnosis not present

## 2020-03-25 DIAGNOSIS — I10 Essential (primary) hypertension: Secondary | ICD-10-CM | POA: Diagnosis not present

## 2020-03-25 DIAGNOSIS — Z79899 Other long term (current) drug therapy: Secondary | ICD-10-CM | POA: Diagnosis not present

## 2020-03-25 LAB — CMP (CANCER CENTER ONLY)
ALT: 19 U/L (ref 0–44)
AST: 17 U/L (ref 15–41)
Albumin: 3.8 g/dL (ref 3.5–5.0)
Alkaline Phosphatase: 91 U/L (ref 38–126)
Anion gap: 9 (ref 5–15)
BUN: 18 mg/dL (ref 8–23)
CO2: 24 mmol/L (ref 22–32)
Calcium: 9.4 mg/dL (ref 8.9–10.3)
Chloride: 107 mmol/L (ref 98–111)
Creatinine: 0.97 mg/dL (ref 0.61–1.24)
GFR, Est AFR Am: >60 mL/min (ref >60)
GFR, Estimated: >60 mL/min (ref >60)
Glucose, Bld: 117 mg/dL — ABNORMAL HIGH (ref 70–99)
Potassium: 4 mmol/L (ref 3.5–5.1)
Sodium: 140 mmol/L (ref 135–145)
Total Bilirubin: 0.3 mg/dL (ref 0.3–1.2)
Total Protein: 7 g/dL (ref 6.5–8.1)

## 2020-03-25 LAB — CBC WITH DIFFERENTIAL (CANCER CENTER ONLY)
Abs Immature Granulocytes: 0.01 10*3/uL (ref 0.00–0.07)
Basophils Absolute: 0.1 10*3/uL (ref 0.0–0.1)
Basophils Relative: 1 %
Eosinophils Absolute: 0.1 10*3/uL (ref 0.0–0.5)
Eosinophils Relative: 2 %
HCT: 39.2 % (ref 39.0–52.0)
Hemoglobin: 13 g/dL (ref 13.0–17.0)
Immature Granulocytes: 0 %
Lymphocytes Relative: 31 %
Lymphs Abs: 2 10*3/uL (ref 0.7–4.0)
MCH: 29 pg (ref 26.0–34.0)
MCHC: 33.2 g/dL (ref 30.0–36.0)
MCV: 87.5 fL (ref 80.0–100.0)
Monocytes Absolute: 0.9 10*3/uL (ref 0.1–1.0)
Monocytes Relative: 14 %
Neutro Abs: 3.5 10*3/uL (ref 1.7–7.7)
Neutrophils Relative %: 52 %
Platelet Count: 364 10*3/uL (ref 150–400)
RBC: 4.48 MIL/uL (ref 4.22–5.81)
RDW: 15.8 % — ABNORMAL HIGH (ref 11.5–15.5)
WBC Count: 6.6 10*3/uL (ref 4.0–10.5)
nRBC: 0 % (ref 0.0–0.2)

## 2020-03-25 LAB — MAGNESIUM: Magnesium: 2.1 mg/dL (ref 1.7–2.4)

## 2020-03-25 LAB — LACTATE DEHYDROGENASE: LDH: 173 U/L (ref 98–192)

## 2020-03-25 MED ORDER — DOXORUBICIN HCL CHEMO IV INJECTION 2 MG/ML
25.0000 mg/m2 | Freq: Once | INTRAVENOUS | Status: AC
  Administered 2020-03-25: 62 mg via INTRAVENOUS
  Filled 2020-03-25: qty 31

## 2020-03-25 MED ORDER — SODIUM CHLORIDE 0.9% FLUSH
10.0000 mL | INTRAVENOUS | Status: DC | PRN
  Administered 2020-03-25: 10 mL
  Filled 2020-03-25: qty 10

## 2020-03-25 MED ORDER — SODIUM CHLORIDE 0.9 % IV SOLN
150.0000 mg | Freq: Once | INTRAVENOUS | Status: AC
  Administered 2020-03-25: 150 mg via INTRAVENOUS
  Filled 2020-03-25: qty 150

## 2020-03-25 MED ORDER — DIPHENHYDRAMINE HCL 25 MG PO TABS
25.0000 mg | ORAL_TABLET | Freq: Once | ORAL | Status: AC
  Administered 2020-03-25: 25 mg via ORAL
  Filled 2020-03-25: qty 1

## 2020-03-25 MED ORDER — DIPHENHYDRAMINE HCL 25 MG PO CAPS
ORAL_CAPSULE | ORAL | Status: AC
  Filled 2020-03-25: qty 1

## 2020-03-25 MED ORDER — SODIUM CHLORIDE 0.9% FLUSH
10.0000 mL | INTRAVENOUS | Status: DC | PRN
  Administered 2020-03-25: 10:00:00 10 mL
  Filled 2020-03-25: qty 10

## 2020-03-25 MED ORDER — HEPARIN SOD (PORK) LOCK FLUSH 100 UNIT/ML IV SOLN
500.0000 [IU] | Freq: Once | INTRAVENOUS | Status: AC | PRN
  Administered 2020-03-25: 500 [IU]
  Filled 2020-03-25: qty 5

## 2020-03-25 MED ORDER — ALTEPLASE 2 MG IJ SOLR
2.0000 mg | Freq: Once | INTRAMUSCULAR | Status: DC | PRN
  Filled 2020-03-25: qty 2

## 2020-03-25 MED ORDER — VINBLASTINE SULFATE CHEMO INJECTION 1 MG/ML
6.1500 mg/m2 | Freq: Once | INTRAVENOUS | Status: AC
  Administered 2020-03-25: 15 mg via INTRAVENOUS
  Filled 2020-03-25: qty 15

## 2020-03-25 MED ORDER — PALONOSETRON HCL INJECTION 0.25 MG/5ML
INTRAVENOUS | Status: AC
  Filled 2020-03-25: qty 5

## 2020-03-25 MED ORDER — SODIUM CHLORIDE 0.9 % IV SOLN
375.0000 mg/m2 | Freq: Once | INTRAVENOUS | Status: AC
  Administered 2020-03-25: 920 mg via INTRAVENOUS
  Filled 2020-03-25: qty 92

## 2020-03-25 MED ORDER — SODIUM CHLORIDE 0.9 % IV SOLN
Freq: Once | INTRAVENOUS | Status: AC
  Filled 2020-03-25: qty 250

## 2020-03-25 MED ORDER — PALONOSETRON HCL INJECTION 0.25 MG/5ML
0.2500 mg | Freq: Once | INTRAVENOUS | Status: AC
  Administered 2020-03-25: 0.25 mg via INTRAVENOUS

## 2020-03-25 MED ORDER — SODIUM CHLORIDE 0.9 % IV SOLN
10.0000 mg | Freq: Once | INTRAVENOUS | Status: AC
  Administered 2020-03-25: 10 mg via INTRAVENOUS
  Filled 2020-03-25: qty 10

## 2020-03-25 NOTE — Patient Instructions (Signed)

## 2020-03-25 NOTE — Progress Notes (Signed)
[facility] [phone]   [phone]  PROGRESS NOTE  Patient Care Team: [name], MD as PCP - General (Internal Medicine)  Hematological/Oncological History # Classical Hodgkin's Lymphoma, Stage II 1)10/10/2019: CT scan for right shoulder pain showed increased right axillary lymphadenopathy.  2) 12/10/2019: patient underwent US of the right axilla, noted to have right axillary lymphadenopathy 3) 12/15/2019: [place] axillary lymph node biopsy. Pathology confirms Classical Hodgkin's lymphoma 4) 12/24/2019: Establish care with Dr. Lorenso Courier 5)  12/31/2019: PET CT scan showed signs of right axillary adenopathy with increased metabolic activity 6) [id number]: Cycle 1 Day 1 of AVD chemotherapy (Bleomycin to be held for the duration of treatment) 7) 01/29/2020: Cycle 1 Day 14 of AVD chemotherapy  8) 02/12/2020: Cycle 2 Day 1 of AVD chemotherapy  9) 02/26/2020: Cycle 2 Day 15 of AVD chemotherapy  10) 03/11/2020: Cycle 3 Day 1 of AVD chemotherapy  11) 03/25/2020: Cycle 3 Day 15 of AVD chemotherapy   Interval History:  Marc Watson 64 y.o. male with medical history significant for classical Hodgkin Lymphoma who presents for a follow up visit. The patient's last visit was on 03/11/2020. Today the patient will start Cycle 3 Day 15 of AVD chemotherapy.   On exam today Marc Watson notes that he feels quite well.  He reports that he only had minor itching after his last dose of chemotherapy on cycle 1 day 1 which was administered on 03/11/2020.  He reports that after his last treatment he had very little fatigue and that he was able to work all weekend which was not something he was able to do after his previous cycles.  He notes that the itching tends to be focal and responds very well to Benadryl.  He requested today that we administer the Benadryl here in house as opposed to him taking the baby chewables at home.  He reports that his appetite is quite good and he feels he is getting  "a little bit too big" and therefore the recent weight loss he is experiencing has been intentional.  He denies having any fevers, chills, sweats, nausea, vomiting or diarrhea.  He has no major questions or concerns today.  A full 10 point ROS is listed below.  MEDICAL HISTORY:  Past Medical History:  Diagnosis Date  . Arthritis   . COPD (chronic obstructive pulmonary disease) ([place])    per CXR on 11/11/2019  . GERD (gastroesophageal reflux disease)    occ  . Hypertension    recently started taking on 11/05/2019   ALLERGIES:  is allergic to no known allergies.  MEDICATIONS:  Current Outpatient Medications  Medication Sig Dispense Refill  . diphenhydrAMINE (BENADRYL) 25 mg capsule Take 25 mg by mouth every 6 (six) hours as needed.    . cyclobenzaprine (FLEXERIL) 10 MG tablet Take 10 mg by mouth at bedtime as needed for muscle spasms.     Marland Kitchen lidocaine-prilocaine (EMLA) cream Apply 1 application topically as needed. 30 g 1  . losartan (COZAAR) 50 MG tablet Take 50 mg by mouth daily.    . naproxen (NAPROSYN) 250 MG tablet Take by mouth 2 (two) times daily with a meal. As needed    . Omega-3 Fatty Acids (FISH OIL PO) Take 2 capsules by mouth daily. OMEGA XL PROPRIETARY BLEND 300 MG d-ALPHA TOCOPHEROL (VITAMIN E)/EXTRA VIRGIN OLIVE OIL/EXTRACT ([id number]) CONTAINING OMEGA FATTY ACIDS, GREEN LIPPED MUSSEL (PERNA CANLICULUS) OIL)     . omeprazole (PRILOSEC) 40 MG capsule Take 1 capsule (40 mg  total) by mouth daily. 90 capsule 3  . ondansetron (ZOFRAN) 8 MG tablet Take 1 tablet (8 mg total) by mouth every 8 (eight) hours as needed for nausea or vomiting. 30 tablet 1  . prochlorperazine (COMPAZINE) 10 MG tablet Take 1 tablet (10 mg total) by mouth every 6 (six) hours as needed for nausea or vomiting. 30 tablet 1   Current Facility-Administered Medications  Medication Dose Route Frequency Provider Last Rate Last Admin  . diphenhydrAMINE (BENADRYL) tablet 25 mg  25 mg Oral Once Orson Slick, MD        Facility-Administered Medications Ordered in Other Visits  Medication Dose Route Frequency Provider Last Rate Last Admin  . dacarbazine (DTIC) 920 mg in sodium chloride 0.9 % 250 mL chemo infusion  375 mg/m2 (Treatment Plan Recorded) Intravenous Once Orson Slick, MD      . DOXOrubicin (ADRIAMYCIN) chemo injection 62 mg  25 mg/m2 (Treatment Plan Recorded) Intravenous Once [name] IV, MD      . heparin lock flush 100 unit/mL  500 Units Intracatheter Once PRN [name] IV, MD      . sodium chloride flush (NS) 0.9 % injection 10 mL  10 mL Intracatheter PRN [name] IV, MD      . vinBLAStine (VELBAN) 15 mg in sodium chloride 0.9 % 50 mL chemo infusion  6.15 mg/m2 (Treatment Plan Recorded) Intravenous Once Orson Slick, MD        REVIEW OF SYSTEMS:   Constitutional: ( - ) fevers, ( - )  chills , ( - ) night sweats (+) itching Eyes: ( - ) blurriness of vision, ( - ) double vision, ( - ) watery eyes Ears, nose, mouth, throat, and face: ( - ) mucositis, ( - ) sore throat Respiratory: ( - ) cough, ( - ) dyspnea, ( - ) wheezes Cardiovascular: ( - ) palpitation, ( - ) chest discomfort, ( - ) lower extremity swelling Gastrointestinal:  ( - ) nausea, ( - ) heartburn, ( - ) change in bowel habits Skin: ( - ) abnormal skin rashes Lymphatics: ( - ) new lymphadenopathy, ( - ) easy bruising Neurological: ( - ) numbness, ( - ) tingling, ( - ) new weaknesses Behavioral/Psych: ( - ) mood change, ( - ) new changes  All other systems were reviewed with the patient and are negative.  PHYSICAL EXAMINATION: ECOG PERFORMANCE STATUS: 0 - Asymptomatic  Vitals:   03/25/20 1014  BP: 129/79  Pulse: 72  Resp: 20  Temp: 98.2 F (36.8 C)  SpO2: 99%   Filed Weights   03/25/20 1014  Weight: 260 lb (117.9 kg)    GENERAL: well appearing middle aged Caucasian male in NAD  SKIN: skin color, texture, turgor are normal, no rashes or significant lesions EYES: conjunctiva are pink and  non-injected, sclera clear LUNGS: clear to auscultation and percussion with normal breathing effort HEART: regular rate & rhythm and no murmurs and no lower extremity edema Musculoskeletal: no cyanosis of digits and no clubbing  PSYCH: alert & oriented x 3, fluent speech NEURO: no focal motor/sensory deficits  LABORATORY DATA:  I have reviewed the data as listed CBC Latest Ref Rng & Units 03/25/2020 03/11/2020 02/26/2020  WBC 4.0 - 10.5 K/uL 6.6 8.3 6.2  Hemoglobin 13.0 - 17.0 g/dL 13.0 12.4(L) 12.9(L)  Hematocrit 39.0 - 52.0 % 39.2 37.5(L) 39.4  Platelets 150 - 400 K/uL 364 323 295    CMP Latest  Ref Rng & Units 03/25/2020 03/11/2020 02/26/2020  Glucose 70 - 99 mg/dL 117(H) 107(H) 111(H)  BUN 8 - 23 mg/dL 18 14 15   Creatinine 0.61 - 1.24 mg/dL 0.97 1.09 0.85  Sodium 135 - 145 mmol/L 140 141 141  Potassium 3.5 - 5.1 mmol/L 4.0 3.8 3.7  Chloride 98 - 111 mmol/L 107 108 107  CO2 22 - 32 mmol/L 24 24 26   Calcium 8.9 - 10.3 mg/dL 9.4 9.5 9.3  Total Protein 6.5 - 8.1 g/dL 7.0 6.6 6.3(L)  Total Bilirubin 0.3 - 1.2 mg/dL 0.3 <0.2(L) 0.2(L)  Alkaline Phos 38 - 126 U/L 91 87 90  AST 15 - 41 U/L 17 24 19   ALT 0 - 44 U/L 19 42 21     RADIOGRAPHIC STUDIES: I have personally reviewed the radiological images as listed and agreed with the findings in the report: marked improvement in right axillary FDG avidity. Some residual activity in the neck/AP window.   NM PET Image Restag (PS) Skull Base To Thigh  Result Date: 03/08/2020 CLINICAL DATA:  Subsequent treatment strategy for Hodgkin's lymphoma. EXAM: NUCLEAR MEDICINE PET SKULL BASE TO THIGH TECHNIQUE: 13.2 mCi F-18 FDG was injected intravenously. Full-ring PET imaging was performed from the skull base to thigh after the radiotracer. CT data was obtained and used for attenuation correction and anatomic localization. Fasting blood glucose: 91 mg/dl COMPARISON:  12/31/2019 FINDINGS: Mediastinal blood pool activity: SUV max 2.4 Liver activity: SUV max 3.7  NECK: Frontal activity appears symmetric and is significantly reduced from prior, maximum SUV 4.0 on the right (formerly 8.4) and 3.9 on the left (previously 7.6). This may well be physiologic and currently measures at low Deauville 4 levels. Previous index left level II lymph node measures 0.6 cm in short axis on image 26/4 (formerly 1.1 cm by my measurements) with a maximum SUV of 1.2 (formerly 2.9). This is currently Deauville 2 activity. 1.8 by 1.1 cm left inferior thyroid nodule without accentuated metabolic activity. No follow-up recommended (ref: J Am Coll Radiol. 2015 Feb;12(2): 143-50). Incidental CT findings: Mild chronic ethmoid sinusitis. CHEST: The previous dominant hypermetabolic right axillary lymph nodes have completely resolved (Deauville 1). 1.5 cm in short axis AP window lymph node on image 72/4, stable in size, maximum SUV 2.0 (Deauville 2), previously 2.9. No left axillary adenopathy. Incidental CT findings: Right Port-A-Cath tip: SVC. Coronary, aortic arch, and branch vessel atherosclerotic vascular disease. ABDOMEN/PELVIS: No significant abnormal hypermetabolic activity in this region. Incidental CT findings: No splenomegaly. 2.4 by 1.7 cm left adrenal adenoma. Aortoiliac atherosclerotic vascular disease. Small periampullary duodenal diverticulum. SKELETON: Accentuated activity along the right proximal humeral joint capsule, thought to be incidental/physiologic. Right total shoulder prosthesis. Incidental CT findings: Left total hip prosthesis. Lumbar spondylosis and degenerative disc disease likely causing multilevel impingement. IMPRESSION: 1. Significant improvement, with complete resolution of the right axillary adenopathy (the larger previous dominant lymph nodes are currently no longer present compatible with Deauville 1 disease); reduced activity and size of lymph nodes in the neck, currently Deauville 2; and Deauville 2 activity in the AP window lymph node. No splenomegaly. 2. Reduced  tonsillar activity bilaterally, likely physiologic. 3. Other imaging findings of potential clinical significance: Left adrenal adenoma. Aortic Atherosclerosis (ICD10-I70.0). Coronary atherosclerosis. Probable multilevel lumbar impingement. Electronically Signed   By: Van Clines M.D.   On: 03/08/2020 10:15    ASSESSMENT & PLAN Marc Watson 64 y.o. male with medical history significant for classical Hodgkin Lymphoma who presents for a follow up visit. He  now presents for Cycle 3 Day 15 treatment.   Overall Mr. Dandurand is tolerating therapy well with minimal side effects including some continued itching on Day 1 and 2, mild nausea (no vomiting).  His labs continue to be stable (with a rebound to normal Hgb today) and he has no concerns moving about forward.  I think it is appropriate to continue with AVD chemotherapy as planned.  The current plan is for AVD chemotherapy x 4 total cycles and refer for consideration of ISRT. After all treatments would reassess with PET CT scan (recommended within 3 months of completion of therapy). The regimen consists of doxorubicin 25mg /m2, vinblastine 6mg /m2, and dacarbazine 375mg /m2 on Day 1 and Day 15. Bleomycin will be held on concern for his lung function and for his age. He received Cycle 1 Day 1 on 01/16/2020.   IPS Score For Hodgkin Lymphoma: 2 (81% OS, 67% freedom from progression)  # Classical Hodgkin's Lymphoma, Stage II (Nonbulky, Favorable) --continue AVD chemotherapy (with no bleomycin). Today is Cycle 3 Day 15  --restaging post Cycle 2 PET scan performed 03/08/2020, showed significant improvement, with complete resolution of the right axillary adenopathy (the larger previous dominant lymph nodes are currently no longer present compatible with Deauville 1 disease); reduced activity and size of lymph nodes in the neck, currently Deauville 2; and Deauville 2 activity in the AP window lymph node. --patient has undergone appropriate pre-treatment  testing including TTE, port placement, and blood work. PFTs were not able to be performed due to issues with scheduling during the [place] pandemic. Bleomycin held due to age and concern for underlying lung disease --stable labs, can check q 2 weeks on chemotherapy days.  --referral to radiation oncology placed today for consideration of post chemotherapy RT to the affected lymph nodes.  --plan for RTC on 04/08/2020 prior to Cycle 4 Day 1 dose of chemotherapy.   #Thyroid Nodule, stable --found incidentally on last PET CT scan on 12/31/2019 --no further imaging f/u required per recommendations on last PET CT --continue to monitor   #Itching, stable --continue benadryl PRN --patient notes he has some itching on day 1 and 2 of treatment, but not as severe as intial episode.  --call for worsening or intractable itching  #Symptom Management --patient provided with PRN medications for zofran and compazine for nausea  --recommend omeprazole for heartburn, stomach upset. Patient can use OTC simethicone for excessive gas.  --EMLA cream provided for port  --will continue to monitor through course of treatment   Orders Placed This Encounter  Procedures  . Ambulatory referral to Radiation Oncology    Referral Priority:   Routine    Referral Type:   Consultation    Referral Reason:   Specialty Services Required    Requested Specialty:   Radiation Oncology    Number of Visits Requested:   1   All questions were answered. The patient knows to call the clinic with any problems, questions or concerns.  A total of more than 30 minutes were spent on this encounter and over half of that time was spent on counseling and coordination of care as outlined above.   [name], MD Department of Hematology/Oncology [place] [facility] Phone: [phone] Pager: [phone] Email: [email or web address]  03/25/2020 12:38 PM

## 2020-03-25 NOTE — Patient Instructions (Signed)
[facility] Discharge Instructions for Patients Receiving Chemotherapy  Today you received the following chemotherapy agents: doxorubicin, vinblastine, and dacarbazine.  To help prevent nausea and vomiting after your treatment, we encourage you to take your nausea medication as directed.   If you develop nausea and vomiting that is not controlled by your nausea medication, call the clinic.   BELOW ARE SYMPTOMS THAT SHOULD BE REPORTED IMMEDIATELY:  *FEVER GREATER THAN 100.5 F  *CHILLS WITH OR WITHOUT FEVER  NAUSEA AND VOMITING THAT IS NOT CONTROLLED WITH YOUR NAUSEA MEDICATION  *UNUSUAL SHORTNESS OF BREATH  *UNUSUAL BRUISING OR BLEEDING  TENDERNESS IN MOUTH AND THROAT WITH OR WITHOUT PRESENCE OF ULCERS  *URINARY PROBLEMS  *BOWEL PROBLEMS  UNUSUAL RASH Items with * indicate a potential emergency and should be followed up as soon as possible.  Feel free to call the clinic should you have any questions or concerns. The clinic phone number is [phone].  Please show the CHEMO ALERT CARD at check-in to the Emergency Department and triage nurse.   

## 2020-03-26 NOTE — Progress Notes (Signed)
Lymphoma Location(s) / Histology:  Classical Hodgkin's Lymphoma, Stage II (RIGHT axilla)  Marc Watson presented ~6 months ago with symptoms of:  Marc Watson underwent CT scanning of his right shoulder on 10/10/2019 due to marked pain in his right shoulder.  The CT scan showed severe right glenohumeral joint degenerative changes as well as right lower axillary lymph nodes.  Patient underwent arthroscopic surgery on 11/13/2019 and was discharged home in good condition.  When the lymphadenopathy failed to resolve the patient underwent a ultrasound on 12/10/2019 which revealed right axillary lymphadenopathy, the largest lymph node measuring 2.0 x 1.9 x 2.0 cm  Biopsies of Lymph node, needle/core biopsy, right axilla revealed: 12/15/2019: Core biopsies reveal an atypical lymphoreticular proliferation with clusters of large lymphoid cells. The cells have biand multi-nucleation with prominent nucleoli consistent with Hodgkin Marc Watson (HRS) cells. There are mononuclear and lacunar variants, as well as, mummified cells. Background cells consist of histiocytes, eosinophils, and small lymphocytes. Distinct bands of fibrosis are not seen on the core biopsy. Immunohistochemistry highlights the HRS cells (CD30, CD15, PAX-5 characteristically weak). CD3 reveals T-cells and CD20 reveals B-cells. EBV in situ hybridization is negative. The overall findings are consistent with classical Hodgkin lymphoma.  Past/Anticipated interventions by medical oncology, if any:  Under care of Marc Watson: The current plan is for AVD chemotherapy x 4 total cycles and refer for consideration of ISRT. After all treatments would reassess with PET CT scan (recommended within 3 months of completion of therapy). The regimen consists of doxorubicin 25mg /m2, vinblastine 6mg /m2, and dacarbazine 375mg /m2 on Day 1 and Day 15. Bleomycin will be held on concern for his lung function and for his age  Weight changes, if any,  over the past 6 months:  Per patient he started gaining weight during his first 2 cycles of chemotherapy, but says he's started to lose some of that weight according to his last treatment on 03/25/2020.  Recurrent fevers, or drenching night sweats, if any: None  SAFETY ISSUES:  Prior radiation? No  Pacemaker/ICD? No  Possible current pregnancy? N/A  Is the patient on methotrexate? No  Current Complaints / other details:   Thyroid Nodule, stable --found incidentally on last PET CT scan on 12/31/2019 --no further imaging f/u required per recommendations on last PET CT --continue to monitor  Per patient: Marc Watson does not want him to receive the COVID-19 vaccine until after he finishes his chemotherapy regimen.

## 2020-03-29 ENCOUNTER — Other Ambulatory Visit: Payer: Self-pay

## 2020-03-29 ENCOUNTER — Ambulatory Visit
Admission: RE | Admit: 2020-03-29 | Discharge: 2020-03-29 | Disposition: A | Discharge status: Home / Self Care | Payer: BC Managed Care – PPO | Source: Ambulatory Visit | Attending: Radiation Oncology | Admitting: Radiation Oncology

## 2020-03-29 ENCOUNTER — Encounter: Payer: Self-pay | Admitting: Radiation Oncology

## 2020-03-29 DIAGNOSIS — C8104 Nodular lymphocyte predominant Hodgkin lymphoma, lymph nodes of axilla and upper limb: Secondary | ICD-10-CM | POA: Diagnosis not present

## 2020-03-29 MED ORDER — NICOTINE 7 MG/24HR TD PT24: MEDICATED_PATCH | TRANSDERMAL | 0 refills | Status: AC

## 2020-03-29 MED ORDER — NICOTINE 14 MG/24HR TD PT24: MEDICATED_PATCH | TRANSDERMAL | 2 refills | Status: AC

## 2020-03-29 NOTE — Progress Notes (Signed)
Radiation Oncology         [phone] ________________________________  Initial outpatient Consultation by telephone.  The patient opted for telemedicine to maximize safety during the pandemic.  MyChart video was not obtainable.   Name: NASAIR IWEN MRN: [id number]  Date: 03/29/2020  DOB: June 14, 1956  CC:Kim, Jeneen Rinks, MD  Truitt Merle, MD   REFERRING PHYSICIAN: Truitt Merle, MD  DIAGNOSIS:    ICD-10-CM   1. 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 male who presented with right shoulder pain.  This ended up being an orthopedic issue.  However, work-up of his shoulder pain incidentally showed enlarged right lower axillary lymph nodes.  He ultimately underwent further work-up of these lymph nodes including right axillary biopsy on 12/15/2019 which revealed classical Hodgkin lymphoma.  Initial pet imaging revealed right axillary adenopathy with SUV uptake.  Nonspecific tonsillar activity.  Mildly enlarged lymph nodes in the neck and left axilla without increased activity.  Left thyroid nodule, 2 cm.  Left adrenal adenoma.  1.5 cm AP window lymph node with SUV max of 2.9  Dr. Lorenso Courier has given him 3 cycles thus far of AVD chemotherapy.  After the second cycle of chemotherapy the patient underwent pet imaging again.  This was notable for complete resolution of the right axillary adenopathy.  Reduced activity in size of lymph nodes in the neck with Deauville 2 activity in Deauville 2 activity of an AP window lymph node.  Reduced tonsillar activity bilaterally, likely physiologic.    He anticipates completing his fourth cycle on May 13.  Bleomycin has been  held due to the patient's age and lung function.    He denies any unintentional weight loss, recurrent fevers, or drenching night sweats prior to diagnosis  He denies prior radiotherapy.  He denies having a pacemaker or ICD.  He is delaying his Covid vaccine until after he finishes chemotherapy, per recommendations from Dr. Lorenso Courier.  He smokes 10 cigs/day. He has cut down from over 1ppd.  No B symptoms.   PREVIOUS RADIATION THERAPY: No  PAST MEDICAL HISTORY:  has a past medical history of Arthritis, COPD (chronic obstructive pulmonary disease) ([place]), GERD (gastroesophageal reflux disease), and Hypertension.    PAST SURGICAL HISTORY: Past Surgical History:  Procedure Laterality Date  . IR IMAGING GUIDED PORT INSERTION  01/05/2020  . JOINT REPLACEMENT  2018   left hip  . KNEE SURGERY Right    cartilage  . LEG SURGERY Left 1969   "stunted growth left leg"  . TOTAL HIP ARTHROPLASTY Left 03/27/2017  . TOTAL HIP ARTHROPLASTY Left 03/27/2017   Procedure: TOTAL HIP ARTHROPLASTY ANTERIOR APPROACH;  Surgeon: Melrose Nakayama, MD;  Location: [facility];  Service: Orthopedics;  Laterality: Left;  . TOTAL SHOULDER ARTHROPLASTY Right 11/13/2019   Procedure: TOTAL SHOULDER ARTHROPLASTY;  Surgeon: Tania Ade, MD;  Location: [facility];  Service: Orthopedics;  Laterality: Right;    FAMILY HISTORY: family history includes Alzheimer's disease in his paternal grandmother; Breast cancer in his sister; Colon cancer in his maternal uncle; Heart attack in his paternal grandfather; Heart disease in his mother; Skin cancer in his brother and father.  SOCIAL HISTORY:  reports that he has been smoking. He has a 15.00 pack-year smoking history. He has never used smokeless tobacco. He reports current alcohol use. He reports current drug use. Drug: Cocaine.  ALLERGIES: No known allergies  MEDICATIONS:  Current Outpatient Medications  Medication Sig Dispense Refill  . cyclobenzaprine (FLEXERIL) 10 MG tablet Take 10  mg by mouth at bedtime as needed for muscle spasms.     . diphenhydrAMINE (BENADRYL) 25 mg capsule Take 25 mg by mouth every 6 (six) hours as needed.    . lidocaine-prilocaine (EMLA) cream Apply 1 application topically as needed. 30 g 1  . losartan (COZAAR) 50 MG tablet Take 50 mg by mouth daily.    . naproxen (NAPROSYN) 250 MG tablet Take by mouth as needed for moderate pain. As needed     . Omega-3 Fatty Acids (FISH OIL PO) Take 2 capsules by mouth daily. OMEGA XL PROPRIETARY BLEND 300 MG d-ALPHA TOCOPHEROL (VITAMIN E)/EXTRA VIRGIN OLIVE OIL/EXTRACT (pcso-524) CONTAINING OMEGA FATTY ACIDS, GREEN LIPPED MUSSEL (PERNA CANLICULUS) OIL)     . nicotine (NICODERM CQ - DOSED IN MG/24 HOURS) 14 mg/24hr patch Apply 14mg  patch daily x 6 wk, then 7 mg patch daily x 2 wk 14 patch 2  . nicotine (NICODERM CQ - DOSED IN MG/24 HR) 7 mg/24hr patch Apply 14mg  patch daily x 6 wk, then 7 mg patch daily x 2 wk 14 patch 0  . omeprazole (PRILOSEC) 40 MG capsule Take 1 capsule (40 mg total) by mouth daily. (Patient not taking: Reported on 03/29/2020) 90 capsule 3  . ondansetron (ZOFRAN) 8 MG tablet Take 1 tablet (8 mg total) by mouth every 8 (eight) hours as needed for nausea or vomiting. (Patient not taking: Reported on 03/29/2020) 30 tablet 1  . prochlorperazine (COMPAZINE) 10 MG tablet Take 1 tablet (10 mg total) by mouth every 6 (six) hours as needed for nausea or vomiting. (Patient not taking: Reported on 03/29/2020) 30 tablet 1   No current facility-administered medications for this encounter.    REVIEW OF SYSTEMS:  Notable for that above.   PHYSICAL EXAM:  vitals were not taken for this visit.   Alert and oriented in no acute distress    LABORATORY DATA:  Lab Results  Component Value Date   WBC 6.6 03/25/2020   HGB 13.0 03/25/2020   HCT 39.2 03/25/2020   MCV 87.5 03/25/2020   PLT 364 03/25/2020   CMP     Component Value Date/Time   NA 140 03/25/2020 1005   K 4.0 03/25/2020 1005   CL 107 03/25/2020  1005   CO2 24 03/25/2020 1005   GLUCOSE 117 (H) 03/25/2020 1005   BUN 18 03/25/2020 1005   CREATININE 0.97 03/25/2020 1005   CALCIUM 9.4 03/25/2020 1005   PROT 7.0 03/25/2020 1005   ALBUMIN 3.8 03/25/2020 1005   AST 17 03/25/2020 1005   ALT 19 03/25/2020 1005   ALKPHOS 91 03/25/2020 1005   BILITOT 0.3 03/25/2020 1005   GFRNONAA >60 03/25/2020 1005   GFRAA >60 03/25/2020 1005         RADIOGRAPHY: NM PET Image Restag (PS) Skull Base To Thigh  Result Date: 03/08/2020 CLINICAL DATA:  Subsequent treatment strategy for Hodgkin's lymphoma. EXAM: NUCLEAR MEDICINE PET SKULL BASE TO THIGH TECHNIQUE: 13.2 mCi F-18 FDG was injected intravenously. Full-ring PET imaging was performed from the skull base to thigh after the radiotracer. CT data was obtained and used for attenuation correction and anatomic localization. Fasting blood glucose: 91 mg/dl COMPARISON:  12/31/2019 FINDINGS: Mediastinal blood pool activity: SUV max 2.4 Liver activity: SUV max 3.7 NECK: Frontal activity appears symmetric and is significantly reduced from prior, maximum SUV 4.0 on the right (formerly 8.4) and 3.9 on the left (previously 7.6). This may well be physiologic and currently measures at low Deauville 4 levels. Previous index left level II lymph node measures 0.6 cm in short axis on image 26/4 (formerly 1.1 cm by my measurements) with a maximum SUV of 1.2 (formerly 2.9). This is currently Deauville 2 activity. 1.8 by 1.1 cm left inferior thyroid nodule without accentuated metabolic activity. No follow-up recommended (ref: J Am Coll Radiol. 2015 Feb;12(2): 143-50). Incidental CT findings: Mild chronic ethmoid sinusitis. CHEST: The previous dominant hypermetabolic right axillary lymph nodes have completely resolved (Deauville 1). 1.5 cm in short axis AP window lymph node on image 72/4, stable in size, maximum SUV 2.0 (Deauville 2), previously 2.9. No left axillary adenopathy. Incidental CT findings: Right Port-A-Cath tip: SVC.  Coronary, aortic arch, and branch vessel atherosclerotic vascular disease. ABDOMEN/PELVIS: No significant abnormal hypermetabolic activity in this region. Incidental CT findings: No splenomegaly. 2.4 by 1.7 cm left adrenal adenoma. Aortoiliac atherosclerotic vascular disease. Small periampullary duodenal diverticulum. SKELETON: Accentuated activity along the right proximal humeral joint capsule, thought to be incidental/physiologic. Right total shoulder prosthesis. Incidental CT findings: Left total hip prosthesis. Lumbar spondylosis and degenerative disc disease likely causing multilevel impingement. IMPRESSION: 1. Significant improvement, with complete resolution of the right axillary adenopathy (the larger previous dominant lymph nodes are currently no longer present compatible with Deauville 1 disease); reduced activity and size of lymph nodes in the neck, currently Deauville 2; and Deauville 2 activity in the AP window lymph node. No splenomegaly. 2. Reduced tonsillar activity bilaterally, likely physiologic. 3. Other imaging findings of potential clinical significance: Left adrenal adenoma. Aortic Atherosclerosis (ICD10-I70.0). Coronary atherosclerosis. Probable multilevel lumbar impingement. Electronically Signed   By: Van Clines M.D.   On: 03/08/2020 10:15      IMPRESSION/PLAN:   This is a very nice 64 year old gentleman with a history of Hodgkin's lymphoma.  I spoke personally with Dr. Lorenso Courier about his case.  Given that he will not be receiving more than four cycles of chemotherapy and that bleomycin was held, it is appropriate for him to receive some radiotherapy to the involved sites.  There is some ambiguity as to all of the sites where the lymphoma originated.  It is clear that it originated in the right axilla but there are other nonspecific findings from his initial PET scan at other areas above the diaphragm as well.  I will have the patient be reviewed at our next ENT tumor board so that  we can go over his images and determine if other sites, besides the right axilla, should be targeted.  The patient is enthusiastic about proceeding with radiotherapy.  The potential risks benefits and side effects of radiotherapy were discussed and his questions were answered to his satisfaction.  I asked the patient today about tobacco use. The patient uses tobacco.  I advised the patient to quit. Services were offered by me today including outpatient counseling and pharmacotherapy. I assessed for the willingness to attempt to quit and provided encouragement and demonstrated willingness to make referrals and/or prescriptions to help the patient attempt to quit. The patient has follow-up with the oncologic team to touch base on their tobacco use and /or cessation efforts.  Over 3 minutes were spent on this issue.   Rx for Nicotine patches were made  to [facility]. He states he plants to quit tomorrow and start the nicotine patches; he also knows to call [facility] for extra support.  Family vacation planned for the end of June/Early July. Plans to leave town on Sat June 26th and Return of July 3rd.  Last infusion of chemotherapy planned for May 13th.  We will work around these dates for his treatment planning and treatment delivery  This encounter was provided by telemedicine platform by telephone.  The patient opted for telemedicine to maximize safety during the pandemic.  MyChart video was not obtainable. The patient has given verbal consent for this type of encounter and has been advised to only accept a meeting of this type in a secure network environment. On date of service, in total, I spent 50 minutes on this encounter. The attendants for this meeting include Eppie Gibson  and TALMAGE HORRY.  During the encounter, Eppie Gibson was located at [facility] Radiation Oncology Department.  DENZIL PALKO was located at home.     __________________________________________   Eppie Gibson, MD

## 2020-04-02 ENCOUNTER — Telehealth: Payer: Self-pay | Admitting: *Deleted

## 2020-04-02 NOTE — Telephone Encounter (Signed)
Received call from pt's wife.  She staes she moticed a round purple colored area about the size of a nickel on her husband's arm.  She is calling to see if he has any issues with anemia or  Bleeding problems.  Reviewed labs with her. Advised that his platelet count is normal and so is his HGb.   He is not anemic nor prone to bleeding as his platelet count is normal.  He is not on blood thinners. Likely he bumped his arm on something without realizing it happened.   She states he is feeling fatigued. Advised that this is normal with chemotherapy. He will be getting radiation at some point and advised that this will likely make him feel fatigued as well.  Advised to continue to stay well hydrated and eat well, stay active.  She voiced understanding. No further questions or concerns.

## 2020-04-08 ENCOUNTER — Inpatient Hospital Stay (HOSPITAL_BASED_OUTPATIENT_CLINIC_OR_DEPARTMENT_OTHER): Payer: BC Managed Care – PPO | Admitting: Hematology and Oncology

## 2020-04-08 ENCOUNTER — Inpatient Hospital Stay: Payer: BC Managed Care – PPO

## 2020-04-08 ENCOUNTER — Other Ambulatory Visit: Payer: Self-pay | Admitting: Hematology and Oncology

## 2020-04-08 ENCOUNTER — Encounter: Payer: Self-pay | Admitting: Hematology and Oncology

## 2020-04-08 ENCOUNTER — Other Ambulatory Visit: Payer: Self-pay

## 2020-04-08 VITALS — BP 115/81 | HR 75 | Temp 98.7°F | Resp 17 | Ht 73.0 in | Wt 263.0 lb

## 2020-04-08 DIAGNOSIS — Z95828 Presence of other vascular implants and grafts: Secondary | ICD-10-CM

## 2020-04-08 DIAGNOSIS — Z79899 Other long term (current) drug therapy: Secondary | ICD-10-CM | POA: Diagnosis not present

## 2020-04-08 DIAGNOSIS — Z791 Long term (current) use of non-steroidal anti-inflammatories (NSAID): Secondary | ICD-10-CM | POA: Diagnosis not present

## 2020-04-08 DIAGNOSIS — J449 Chronic obstructive pulmonary disease, unspecified: Secondary | ICD-10-CM | POA: Diagnosis not present

## 2020-04-08 DIAGNOSIS — I10 Essential (primary) hypertension: Secondary | ICD-10-CM | POA: Diagnosis not present

## 2020-04-08 DIAGNOSIS — C8104 Nodular lymphocyte predominant Hodgkin lymphoma, lymph nodes of axilla and upper limb: Secondary | ICD-10-CM

## 2020-04-08 DIAGNOSIS — C8170 Other classical Hodgkin lymphoma, unspecified site: Secondary | ICD-10-CM

## 2020-04-08 DIAGNOSIS — E041 Nontoxic single thyroid nodule: Secondary | ICD-10-CM | POA: Diagnosis not present

## 2020-04-08 DIAGNOSIS — K219 Gastro-esophageal reflux disease without esophagitis: Secondary | ICD-10-CM | POA: Diagnosis not present

## 2020-04-08 DIAGNOSIS — Z5111 Encounter for antineoplastic chemotherapy: Secondary | ICD-10-CM | POA: Diagnosis not present

## 2020-04-08 DIAGNOSIS — Z7952 Long term (current) use of systemic steroids: Secondary | ICD-10-CM | POA: Diagnosis not present

## 2020-04-08 DIAGNOSIS — C8174 Other classical Hodgkin lymphoma, lymph nodes of axilla and upper limb: Secondary | ICD-10-CM | POA: Diagnosis not present

## 2020-04-08 DIAGNOSIS — L298 Other pruritus: Secondary | ICD-10-CM

## 2020-04-08 DIAGNOSIS — T50905A Adverse effect of unspecified drugs, medicaments and biological substances, initial encounter: Secondary | ICD-10-CM | POA: Diagnosis not present

## 2020-04-08 DIAGNOSIS — Z96611 Presence of right artificial shoulder joint: Secondary | ICD-10-CM | POA: Diagnosis not present

## 2020-04-08 LAB — CBC WITH DIFFERENTIAL (CANCER CENTER ONLY)
Abs Immature Granulocytes: 0.01 10*3/uL (ref 0.00–0.07)
Basophils Absolute: 0.1 10*3/uL (ref 0.0–0.1)
Basophils Relative: 1 %
Eosinophils Absolute: 0.2 10*3/uL (ref 0.0–0.5)
Eosinophils Relative: 2 %
HCT: 40.9 % (ref 39.0–52.0)
Hemoglobin: 13.3 g/dL (ref 13.0–17.0)
Immature Granulocytes: 0 %
Lymphocytes Relative: 28 %
Lymphs Abs: 2.2 10*3/uL (ref 0.7–4.0)
MCH: 29.1 pg (ref 26.0–34.0)
MCHC: 32.5 g/dL (ref 30.0–36.0)
MCV: 89.5 fL (ref 80.0–100.0)
Monocytes Absolute: 1.1 10*3/uL — ABNORMAL HIGH (ref 0.1–1.0)
Monocytes Relative: 14 %
Neutro Abs: 4.2 10*3/uL (ref 1.7–7.7)
Neutrophils Relative %: 55 %
Platelet Count: 291 10*3/uL (ref 150–400)
RBC: 4.57 MIL/uL (ref 4.22–5.81)
RDW: 15.9 % — ABNORMAL HIGH (ref 11.5–15.5)
WBC Count: 7.8 10*3/uL (ref 4.0–10.5)
nRBC: 0 % (ref 0.0–0.2)

## 2020-04-08 LAB — CMP (CANCER CENTER ONLY)
ALT: 20 U/L (ref 0–44)
AST: 16 U/L (ref 15–41)
Albumin: 3.7 g/dL (ref 3.5–5.0)
Alkaline Phosphatase: 91 U/L (ref 38–126)
Anion gap: 6 (ref 5–15)
BUN: 18 mg/dL (ref 8–23)
CO2: 25 mmol/L (ref 22–32)
Calcium: 9.6 mg/dL (ref 8.9–10.3)
Chloride: 108 mmol/L (ref 98–111)
Creatinine: 0.92 mg/dL (ref 0.61–1.24)
GFR, Est AFR Am: >60 mL/min (ref >60)
GFR, Estimated: >60 mL/min (ref >60)
Glucose, Bld: 104 mg/dL — ABNORMAL HIGH (ref 70–99)
Potassium: 4.1 mmol/L (ref 3.5–5.1)
Sodium: 139 mmol/L (ref 135–145)
Total Bilirubin: 0.2 mg/dL — ABNORMAL LOW (ref 0.3–1.2)
Total Protein: 7 g/dL (ref 6.5–8.1)

## 2020-04-08 LAB — LACTATE DEHYDROGENASE: LDH: 153 U/L (ref 98–192)

## 2020-04-08 MED ORDER — DIPHENHYDRAMINE HCL 25 MG PO CAPS
ORAL_CAPSULE | ORAL | Status: AC
  Filled 2020-04-08: qty 1

## 2020-04-08 MED ORDER — DOXORUBICIN HCL CHEMO IV INJECTION 2 MG/ML
25.0000 mg/m2 | Freq: Once | INTRAVENOUS | Status: AC
  Administered 2020-04-08: 62 mg via INTRAVENOUS
  Filled 2020-04-08: qty 31

## 2020-04-08 MED ORDER — HEPARIN SOD (PORK) LOCK FLUSH 100 UNIT/ML IV SOLN
500.0000 [IU] | Freq: Once | INTRAVENOUS | Status: AC | PRN
  Administered 2020-04-08: 500 [IU]
  Filled 2020-04-08: qty 5

## 2020-04-08 MED ORDER — PALONOSETRON HCL INJECTION 0.25 MG/5ML
0.2500 mg | Freq: Once | INTRAVENOUS | Status: AC
  Administered 2020-04-08: 0.25 mg via INTRAVENOUS

## 2020-04-08 MED ORDER — SODIUM CHLORIDE 0.9 % IV SOLN
10.0000 mg | Freq: Once | INTRAVENOUS | Status: AC
  Administered 2020-04-08: 10 mg via INTRAVENOUS
  Filled 2020-04-08: qty 10

## 2020-04-08 MED ORDER — DIPHENHYDRAMINE HCL 25 MG PO CAPS
25.0000 mg | ORAL_CAPSULE | Freq: Once | ORAL | Status: AC
  Administered 2020-04-08: 25 mg via ORAL
  Filled 2020-04-08: qty 1

## 2020-04-08 MED ORDER — VINBLASTINE SULFATE CHEMO INJECTION 1 MG/ML
6.1500 mg/m2 | Freq: Once | INTRAVENOUS | Status: AC
  Administered 2020-04-08: 15 mg via INTRAVENOUS
  Filled 2020-04-08: qty 15

## 2020-04-08 MED ORDER — SODIUM CHLORIDE 0.9 % IV SOLN
150.0000 mg | Freq: Once | INTRAVENOUS | Status: AC
  Administered 2020-04-08: 150 mg via INTRAVENOUS
  Filled 2020-04-08: qty 150

## 2020-04-08 MED ORDER — PALONOSETRON HCL INJECTION 0.25 MG/5ML
INTRAVENOUS | Status: AC
  Filled 2020-04-08: qty 5

## 2020-04-08 MED ORDER — DEXAMETHASONE SODIUM PHOSPHATE 10 MG/ML IJ SOLN
INTRAMUSCULAR | Status: AC
  Filled 2020-04-08: qty 1

## 2020-04-08 MED ORDER — SODIUM CHLORIDE 0.9 % IV SOLN
Freq: Once | INTRAVENOUS | Status: AC
  Filled 2020-04-08: qty 250

## 2020-04-08 MED ORDER — SODIUM CHLORIDE 0.9% FLUSH
10.0000 mL | INTRAVENOUS | Status: DC | PRN
  Administered 2020-04-08: 10 mL
  Filled 2020-04-08: qty 10

## 2020-04-08 MED ORDER — SODIUM CHLORIDE 0.9 % IV SOLN
375.0000 mg/m2 | Freq: Once | INTRAVENOUS | Status: AC
  Administered 2020-04-08: 920 mg via INTRAVENOUS
  Filled 2020-04-08: qty 92

## 2020-04-08 NOTE — Progress Notes (Signed)
[place] [phone]   [phone]  PROGRESS NOTE  Patient Care Team: [name], MD as PCP - General (Internal Medicine)  Hematological/Oncological History # Classical Hodgkin's Lymphoma, Stage II 1)10/10/2019: CT scan for right shoulder pain showed increased right axillary lymphadenopathy.  2) 12/10/2019: patient underwent US of the right axilla, noted to have right axillary lymphadenopathy 3) 12/15/2019: [place] axillary lymph node biopsy. Pathology confirms Classical Hodgkin's lymphoma 4) 12/24/2019: Establish care with Dr. Lorenso Courier 5)  12/31/2019: PET CT scan showed signs of right axillary adenopathy with increased metabolic activity 6) [id number]: Cycle 1 Day 1 of AVD chemotherapy (Bleomycin to be held for the duration of treatment) 7) 01/29/2020: Cycle 1 Day 14 of AVD chemotherapy  8) 02/12/2020: Cycle 2 Day 1 of AVD chemotherapy  9) 02/26/2020: Cycle 2 Day 15 of AVD chemotherapy  10) 03/11/2020: Cycle 3 Day 1 of AVD chemotherapy  11) 03/25/2020: Cycle 3 Day 15 of AVD chemotherapy  12) 04/08/2020: Cycle 4 Day 1 of AVD chemotherapy   Interval History:  Marc Watson 64 y.o. male with medical history significant for classical Hodgkin Lymphoma who presents for a follow up visit. The patient's last visit was on 03/25/2020. Today the patient will start Cycle 4 Day 1 of AVD chemotherapy.   On exam today Marc Watson notes that he has been feeling well.  He reports that he tolerated the last cycle of chemotherapy without much in the way of difficulty.  He notes that taking the Benadryl p.o. immediately after chemotherapy was effective in preventing most of the itching.  He reports that his energy is good and that his appetite remains great.  He has gained 3 pounds since his last visit 2 weeks ago.    He notes that he does continue to have some abdominal bloating with treatment, and that this is made worse with consumption of nuts.  Unfortunately he really enjoys eating  nuts, but reports that he will be abstaining from these while on treatment in order to help with the swelling in the abdomen furthermore he did note that he has a small spot on his left forearm which appears like a bruise, but is nonblanching.  He does not recall hitting his arm on anything and that is smaller than the size of a quarter.  It is not causing him any pain and is not currently itching or oozing.  He currently denies having any fevers, chills, sweats, nausea, vomiting or diarrhea.  A full 10 point ROS is listed below.  MEDICAL HISTORY:  Past Medical History:  Diagnosis Date  . Arthritis   . COPD (chronic obstructive pulmonary disease) ([place])    per CXR on 11/11/2019  . GERD (gastroesophageal reflux disease)    occ  . Hypertension    recently started taking on 11/05/2019   ALLERGIES:  is allergic to no known allergies.  MEDICATIONS:  Current Outpatient Medications  Medication Sig Dispense Refill  . cyclobenzaprine (FLEXERIL) 10 MG tablet Take 10 mg by mouth at bedtime as needed for muscle spasms.     . diphenhydrAMINE (BENADRYL) 25 mg capsule Take 25 mg by mouth every 6 (six) hours as needed.    . lidocaine-prilocaine (EMLA) cream Apply 1 application topically as needed. 30 g 1  . losartan (COZAAR) 50 MG tablet Take 50 mg by mouth daily.    . naproxen (NAPROSYN) 250 MG tablet Take by mouth as needed for moderate pain. As needed     . nicotine (NICODERM CQ -  DOSED IN MG/24 HOURS) 14 mg/24hr patch Apply 14mg  patch daily x 6 wk, then 7 mg patch daily x 2 wk 14 patch 2  . nicotine (NICODERM CQ - DOSED IN MG/24 HR) 7 mg/24hr patch Apply 14mg  patch daily x 6 wk, then 7 mg patch daily x 2 wk 14 patch 0  . Omega-3 Fatty Acids (FISH OIL PO) Take 2 capsules by mouth daily. OMEGA XL PROPRIETARY BLEND 300 MG d-ALPHA TOCOPHEROL (VITAMIN E)/EXTRA VIRGIN OLIVE OIL/EXTRACT ([id number]) CONTAINING OMEGA FATTY ACIDS, GREEN LIPPED MUSSEL (PERNA CANLICULUS) OIL)     . omeprazole (PRILOSEC) 40 MG  capsule Take 1 capsule (40 mg total) by mouth daily. (Patient not taking: Reported on 03/29/2020) 90 capsule 3  . ondansetron (ZOFRAN) 8 MG tablet Take 1 tablet (8 mg total) by mouth every 8 (eight) hours as needed for nausea or vomiting. (Patient not taking: Reported on 03/29/2020) 30 tablet 1  . prochlorperazine (COMPAZINE) 10 MG tablet Take 1 tablet (10 mg total) by mouth every 6 (six) hours as needed for nausea or vomiting. (Patient not taking: Reported on 03/29/2020) 30 tablet 1   Current Facility-Administered Medications  Medication Dose Route Frequency Provider Last Rate Last Admin  . diphenhydrAMINE (BENADRYL) capsule 25 mg  25 mg Oral Once Orson Slick, MD       Facility-Administered Medications Ordered in Other Visits  Medication Dose Route Frequency Provider Last Rate Last Admin  . dacarbazine (DTIC) 920 mg in sodium chloride 0.9 % 250 mL chemo infusion  375 mg/m2 (Treatment Plan Recorded) Intravenous Once Orson Slick, MD      . dexamethasone (DECADRON) 10 mg in sodium chloride 0.9 % 50 mL IVPB  10 mg Intravenous Once [name] IV, MD      . DOXOrubicin (ADRIAMYCIN) chemo injection 62 mg  25 mg/m2 (Treatment Plan Recorded) Intravenous Once Orson Slick, MD      . fosaprepitant (EMEND) 150 mg in sodium chloride 0.9 % 145 mL IVPB  150 mg Intravenous Once [name] IV, MD 450 mL/hr at 04/08/20 1254 150 mg at 04/08/20 1254  . heparin lock flush 100 unit/mL  500 Units Intracatheter Once PRN [name] IV, MD      . sodium chloride flush (NS) 0.9 % injection 10 mL  10 mL Intracatheter PRN [name] IV, MD      . vinBLAStine (VELBAN) 15 mg in sodium chloride 0.9 % 50 mL chemo infusion  6.15 mg/m2 (Treatment Plan Recorded) Intravenous Once Orson Slick, MD        REVIEW OF SYSTEMS:   Constitutional: ( - ) fevers, ( - )  chills , ( - ) night sweats (+) itching Eyes: ( - ) blurriness of vision, ( - ) double vision, ( - ) watery eyes Ears, nose, mouth,  throat, and face: ( - ) mucositis, ( - ) sore throat Respiratory: ( - ) cough, ( - ) dyspnea, ( - ) wheezes Cardiovascular: ( - ) palpitation, ( - ) chest discomfort, ( - ) lower extremity swelling Gastrointestinal:  ( - ) nausea, ( - ) heartburn, ( - ) change in bowel habits Skin: ( - ) abnormal skin rashes Lymphatics: ( - ) new lymphadenopathy, ( - ) easy bruising Neurological: ( - ) numbness, ( - ) tingling, ( - ) new weaknesses Behavioral/Psych: ( - ) mood change, ( - ) new changes  All other systems were reviewed with the patient  and are negative.  PHYSICAL EXAMINATION: ECOG PERFORMANCE STATUS: 0 - Asymptomatic  Vitals:   04/08/20 1050  BP: 115/81  Pulse: 75  Resp: 17  Temp: 98.7 F (37.1 C)  SpO2: 97%   Filed Weights   04/08/20 1050  Weight: 263 lb (119.3 kg)    GENERAL: well appearing middle aged Caucasian male in NAD  SKIN: skin color, texture, turgor are normal, no rashes or significant lesions. Small bruise like lesion on left forearm, smaller than quarter.  EYES: conjunctiva are pink and non-injected, sclera clear LUNGS: clear to auscultation and percussion with normal breathing effort HEART: regular rate & rhythm and no murmurs and no lower extremity edema Musculoskeletal: no cyanosis of digits and no clubbing  PSYCH: alert & oriented x 3, fluent speech NEURO: no focal motor/sensory deficits  LABORATORY DATA:  I have reviewed the data as listed CBC Latest Ref Rng & Units 04/08/2020 03/25/2020 03/11/2020  WBC 4.0 - 10.5 K/uL 7.8 6.6 8.3  Hemoglobin 13.0 - 17.0 g/dL 13.3 13.0 12.4(L)  Hematocrit 39.0 - 52.0 % 40.9 39.2 37.5(L)  Platelets 150 - 400 K/uL 291 364 323    CMP Latest Ref Rng & Units 04/08/2020 03/25/2020 03/11/2020  Glucose 70 - 99 mg/dL 104(H) 117(H) 107(H)  BUN 8 - 23 mg/dL 18 18 14   Creatinine 0.61 - 1.24 mg/dL 0.92 0.97 1.09  Sodium 135 - 145 mmol/L 139 140 141  Potassium 3.5 - 5.1 mmol/L 4.1 4.0 3.8  Chloride 98 - 111 mmol/L 108 107 108  CO2 22 -  32 mmol/L 25 24 24   Calcium 8.9 - 10.3 mg/dL 9.6 9.4 9.5  Total Protein 6.5 - 8.1 g/dL 7.0 7.0 6.6  Total Bilirubin 0.3 - 1.2 mg/dL 0.2(L) 0.3 <0.2(L)  Alkaline Phos 38 - 126 U/L 91 91 87  AST 15 - 41 U/L 16 17 24   ALT 0 - 44 U/L 20 19 42     RADIOGRAPHIC STUDIES: I have personally reviewed the radiological images as listed and agreed with the findings in the report: previously viewed PET CT which showed marked improvement in right axillary FDG avidity. Some residual activity in the neck/AP window.   No results found.  ASSESSMENT & PLAN Marc Watson 64 y.o. male with medical history significant for classical Hodgkin Lymphoma who presents for a follow up visit. He now presents for Cycle 4 Day 1 treatment.   Overall Marc Watson is tolerating therapy well with minimal side effects including some continued itching now better controlled with PO benadryl administer in the chemo room after treatment. He continues to have some gas and bloating, but it is not interfering with daily life. His weight is stable and he is optimistic about the future.  The current plan is for AVD chemotherapy x 4 total cycles and refer for consideration of ISRT. After all treatments would reassess with PET CT scan (recommended within 3 months of completion of therapy). The regimen consists of doxorubicin 25mg /m2, vinblastine 6mg /m2, and dacarbazine 375mg /m2 on Day 1 and Day 15. Bleomycin will be held on concern for his lung function and for his age. He received Cycle 1 Day 1 on 01/16/2020.   IPS Score For Hodgkin Lymphoma: 2 (81% OS, 67% freedom from progression)  # Classical Hodgkin's Lymphoma, Stage II (Nonbulky, Favorable) --continue AVD chemotherapy (with no bleomycin). Today is Cycle 4 Day 1  --restaging post Cycle 2 PET scan performed 03/08/2020, showed significant improvement, with complete resolution of the right axillary adenopathy (the larger previous dominant  lymph nodes are currently no longer present  compatible with Deauville 1 disease); reduced activity and size of lymph nodes in the neck, currently Deauville 2; and Deauville 2 activity in the AP window lymph node. Next PET to be performed approximately 12 weeks after completion of radiation therapy.  --patient has undergone appropriate pre-treatment testing including TTE, port placement, and blood work. PFTs were not able to be performed due to issues with scheduling during the [place] pandemic. Bleomycin held due to age and concern for underlying lung disease --stable labs, can check q 2 weeks on chemotherapy days.  --appreciate assessment by radiation oncology for consideration of post chemotherapy RT to the affected lymph nodes.  --plan to RTC for Cycle 4 Day 15 dose of chemotherapy with a clinic visit at the end of Cycle 4.   #Thyroid Nodule, stable --found incidentally on last PET CT scan on 12/31/2019 --no further imaging f/u required per recommendations on last PET CT --continue to monitor   #Itching, stable --continue benadryl PRN --patient notes he has some itching on day 1 and 2 of treatment, but not as severe as intial episode.  --call for worsening or intractable itching  #Symptom Management --patient provided with PRN medications for zofran and compazine for nausea  --recommend omeprazole for heartburn, stomach upset. Patient can use OTC simethicone for excessive gas.  --EMLA cream provided for port  --will continue to monitor through course of treatment   No orders of the defined types were placed in this encounter.  All questions were answered. The patient knows to call the clinic with any problems, questions or concerns.  A total of more than 30 minutes were spent on this encounter and over half of that time was spent on counseling and coordination of care as outlined above.   [name], MD Department of Hematology/Oncology [place] at [facility] Phone: [phone] Pager:  [phone] Email: [email or web address]  04/08/2020 1:08 PM

## 2020-04-08 NOTE — Patient Instructions (Signed)
[facility] Discharge Instructions for Patients Receiving Chemotherapy  Today you received the following chemotherapy agents:  Doxorubacin, Vinblastin, Dactinomycin  To help prevent nausea and vomiting after your treatment, we encourage you to take your nausea medication as prescribed & as needed.    If you develop nausea and vomiting that is not controlled by your nausea medication, call the clinic.   BELOW ARE SYMPTOMS THAT SHOULD BE REPORTED IMMEDIATELY:  *FEVER GREATER THAN 100.5 F  *CHILLS WITH OR WITHOUT FEVER  NAUSEA AND VOMITING THAT IS NOT CONTROLLED WITH YOUR NAUSEA MEDICATION  *UNUSUAL SHORTNESS OF BREATH  *UNUSUAL BRUISING OR BLEEDING  TENDERNESS IN MOUTH AND THROAT WITH OR WITHOUT PRESENCE OF ULCERS  *URINARY PROBLEMS  *BOWEL PROBLEMS  UNUSUAL RASH Items with * indicate a potential emergency and should be followed up as soon as possible.  Feel free to call the clinic should you have any questions or concerns. The clinic phone number is [phone].  Please show the [facility] at check-in to the Emergency Department and triage nurse.

## 2020-04-16 NOTE — Progress Notes (Signed)
Pharmacist Chemotherapy Monitoring - Follow Up Assessment    I verify that I have reviewed each item in the below checklist:  . Regimen for the patient is scheduled for the appropriate day and plan matches scheduled date. Marland Kitchen Appropriate non-routine labs are ordered dependent on drug ordered. . If applicable, additional medications reviewed and ordered per protocol based on lifetime cumulative doses and/or treatment regimen.   Plan for follow-up and/or issues identified: No . I-vent associated with next due treatment: No . MD and/or nursing notified: No  Britt Boozer 04/16/2020 7:57 AM

## 2020-04-22 ENCOUNTER — Other Ambulatory Visit: Payer: BC Managed Care – PPO

## 2020-04-22 ENCOUNTER — Inpatient Hospital Stay: Payer: BC Managed Care – PPO

## 2020-04-22 ENCOUNTER — Other Ambulatory Visit: Payer: Self-pay

## 2020-04-22 ENCOUNTER — Ambulatory Visit: Payer: BC Managed Care – PPO | Admitting: Hematology and Oncology

## 2020-04-22 ENCOUNTER — Inpatient Hospital Stay: Payer: BC Managed Care – PPO | Attending: Hematology and Oncology

## 2020-04-22 VITALS — BP 140/90 | HR 80 | Temp 98.5°F | Resp 18 | Wt 265.5 lb

## 2020-04-22 DIAGNOSIS — E041 Nontoxic single thyroid nodule: Secondary | ICD-10-CM | POA: Insufficient documentation

## 2020-04-22 DIAGNOSIS — J449 Chronic obstructive pulmonary disease, unspecified: Secondary | ICD-10-CM | POA: Insufficient documentation

## 2020-04-22 DIAGNOSIS — Z79899 Other long term (current) drug therapy: Secondary | ICD-10-CM | POA: Diagnosis not present

## 2020-04-22 DIAGNOSIS — K219 Gastro-esophageal reflux disease without esophagitis: Secondary | ICD-10-CM | POA: Insufficient documentation

## 2020-04-22 DIAGNOSIS — C8174 Other classical Hodgkin lymphoma, lymph nodes of axilla and upper limb: Secondary | ICD-10-CM | POA: Insufficient documentation

## 2020-04-22 DIAGNOSIS — Z5111 Encounter for antineoplastic chemotherapy: Secondary | ICD-10-CM | POA: Diagnosis not present

## 2020-04-22 DIAGNOSIS — Z96611 Presence of right artificial shoulder joint: Secondary | ICD-10-CM | POA: Insufficient documentation

## 2020-04-22 DIAGNOSIS — I10 Essential (primary) hypertension: Secondary | ICD-10-CM | POA: Insufficient documentation

## 2020-04-22 DIAGNOSIS — C8104 Nodular lymphocyte predominant Hodgkin lymphoma, lymph nodes of axilla and upper limb: Secondary | ICD-10-CM

## 2020-04-22 DIAGNOSIS — Z791 Long term (current) use of non-steroidal anti-inflammatories (NSAID): Secondary | ICD-10-CM | POA: Insufficient documentation

## 2020-04-22 DIAGNOSIS — Z95828 Presence of other vascular implants and grafts: Secondary | ICD-10-CM

## 2020-04-22 DIAGNOSIS — Z7952 Long term (current) use of systemic steroids: Secondary | ICD-10-CM | POA: Diagnosis not present

## 2020-04-22 DIAGNOSIS — C8170 Other classical Hodgkin lymphoma, unspecified site: Secondary | ICD-10-CM

## 2020-04-22 LAB — CMP (CANCER CENTER ONLY)
ALT: 20 U/L (ref 0–44)
AST: 19 U/L (ref 15–41)
Albumin: 3.7 g/dL (ref 3.5–5.0)
Alkaline Phosphatase: 95 U/L (ref 38–126)
Anion gap: 9 (ref 5–15)
BUN: 15 mg/dL (ref 8–23)
CO2: 24 mmol/L (ref 22–32)
Calcium: 9.2 mg/dL (ref 8.9–10.3)
Chloride: 108 mmol/L (ref 98–111)
Creatinine: 0.94 mg/dL (ref 0.61–1.24)
GFR, Est AFR Am: >60 mL/min (ref >60)
GFR, Estimated: >60 mL/min (ref >60)
Glucose, Bld: 104 mg/dL — ABNORMAL HIGH (ref 70–99)
Potassium: 4.2 mmol/L (ref 3.5–5.1)
Sodium: 141 mmol/L (ref 135–145)
Total Bilirubin: <0.2 mg/dL — ABNORMAL LOW (ref 0.3–1.2)
Total Protein: 6.7 g/dL (ref 6.5–8.1)

## 2020-04-22 LAB — LACTATE DEHYDROGENASE: LDH: 143 U/L (ref 98–192)

## 2020-04-22 LAB — CBC WITH DIFFERENTIAL (CANCER CENTER ONLY)
Abs Immature Granulocytes: 0.02 10*3/uL (ref 0.00–0.07)
Basophils Absolute: 0.1 10*3/uL (ref 0.0–0.1)
Basophils Relative: 1 %
Eosinophils Absolute: 0.1 10*3/uL (ref 0.0–0.5)
Eosinophils Relative: 2 %
HCT: 39.8 % (ref 39.0–52.0)
Hemoglobin: 13 g/dL (ref 13.0–17.0)
Immature Granulocytes: 0 %
Lymphocytes Relative: 28 %
Lymphs Abs: 2.1 10*3/uL (ref 0.7–4.0)
MCH: 29.4 pg (ref 26.0–34.0)
MCHC: 32.7 g/dL (ref 30.0–36.0)
MCV: 90 fL (ref 80.0–100.0)
Monocytes Absolute: 1.1 10*3/uL — ABNORMAL HIGH (ref 0.1–1.0)
Monocytes Relative: 15 %
Neutro Abs: 4 10*3/uL (ref 1.7–7.7)
Neutrophils Relative %: 54 %
Platelet Count: 296 10*3/uL (ref 150–400)
RBC: 4.42 MIL/uL (ref 4.22–5.81)
RDW: 15.9 % — ABNORMAL HIGH (ref 11.5–15.5)
WBC Count: 7.4 10*3/uL (ref 4.0–10.5)
nRBC: 0 % (ref 0.0–0.2)

## 2020-04-22 MED ORDER — DOXORUBICIN HCL CHEMO IV INJECTION 2 MG/ML
25.0000 mg/m2 | Freq: Once | INTRAVENOUS | Status: AC
  Administered 2020-04-22: 62 mg via INTRAVENOUS
  Filled 2020-04-22: qty 31

## 2020-04-22 MED ORDER — PALONOSETRON HCL INJECTION 0.25 MG/5ML
0.2500 mg | Freq: Once | INTRAVENOUS | Status: AC
  Administered 2020-04-22: 0.25 mg via INTRAVENOUS

## 2020-04-22 MED ORDER — SODIUM CHLORIDE 0.9% FLUSH
10.0000 mL | INTRAVENOUS | Status: DC | PRN
  Administered 2020-04-22: 10 mL
  Filled 2020-04-22: qty 10

## 2020-04-22 MED ORDER — SODIUM CHLORIDE 0.9 % IV SOLN
150.0000 mg | Freq: Once | INTRAVENOUS | Status: AC
  Administered 2020-04-22: 150 mg via INTRAVENOUS
  Filled 2020-04-22: qty 150

## 2020-04-22 MED ORDER — SODIUM CHLORIDE 0.9 % IV SOLN
10.0000 mg | Freq: Once | INTRAVENOUS | Status: AC
  Administered 2020-04-22: 10 mg via INTRAVENOUS
  Filled 2020-04-22: qty 10

## 2020-04-22 MED ORDER — PALONOSETRON HCL INJECTION 0.25 MG/5ML
INTRAVENOUS | Status: AC
  Filled 2020-04-22: qty 5

## 2020-04-22 MED ORDER — HEPARIN SOD (PORK) LOCK FLUSH 100 UNIT/ML IV SOLN
500.0000 [IU] | Freq: Once | INTRAVENOUS | Status: AC | PRN
  Administered 2020-04-22: 500 [IU]
  Filled 2020-04-22: qty 5

## 2020-04-22 MED ORDER — SODIUM CHLORIDE 0.9 % IV SOLN
Freq: Once | INTRAVENOUS | Status: AC
  Filled 2020-04-22: qty 250

## 2020-04-22 MED ORDER — VINBLASTINE SULFATE CHEMO INJECTION 1 MG/ML
6.1500 mg/m2 | Freq: Once | INTRAVENOUS | Status: AC
  Administered 2020-04-22: 15 mg via INTRAVENOUS
  Filled 2020-04-22: qty 15

## 2020-04-22 MED ORDER — SODIUM CHLORIDE 0.9 % IV SOLN
375.0000 mg/m2 | Freq: Once | INTRAVENOUS | Status: AC
  Administered 2020-04-22: 920 mg via INTRAVENOUS
  Filled 2020-04-22: qty 92

## 2020-04-22 NOTE — Patient Instructions (Signed)
[facility] Discharge Instructions for Patients Receiving Chemotherapy  Today you received the following chemotherapy agents: doxorubicin, vinblastine, and dacarbazine.  To help prevent nausea and vomiting after your treatment, we encourage you to take your nausea medication as directed.   If you develop nausea and vomiting that is not controlled by your nausea medication, call the clinic.   BELOW ARE SYMPTOMS THAT SHOULD BE REPORTED IMMEDIATELY:  *FEVER GREATER THAN 100.5 F  *CHILLS WITH OR WITHOUT FEVER  NAUSEA AND VOMITING THAT IS NOT CONTROLLED WITH YOUR NAUSEA MEDICATION  *UNUSUAL SHORTNESS OF BREATH  *UNUSUAL BRUISING OR BLEEDING  TENDERNESS IN MOUTH AND THROAT WITH OR WITHOUT PRESENCE OF ULCERS  *URINARY PROBLEMS  *BOWEL PROBLEMS  UNUSUAL RASH Items with * indicate a potential emergency and should be followed up as soon as possible.  Feel free to call the clinic should you have any questions or concerns. The clinic phone number is [phone].  Please show the CHEMO ALERT CARD at check-in to the Emergency Department and triage nurse.   

## 2020-04-22 NOTE — Patient Instructions (Signed)

## 2020-05-05 ENCOUNTER — Ambulatory Visit: Payer: BC Managed Care – PPO | Admitting: Radiation Oncology

## 2020-05-06 ENCOUNTER — Inpatient Hospital Stay (HOSPITAL_BASED_OUTPATIENT_CLINIC_OR_DEPARTMENT_OTHER): Payer: BC Managed Care – PPO | Admitting: Hematology and Oncology

## 2020-05-06 ENCOUNTER — Other Ambulatory Visit: Payer: Self-pay

## 2020-05-06 ENCOUNTER — Inpatient Hospital Stay: Payer: BC Managed Care – PPO

## 2020-05-06 ENCOUNTER — Other Ambulatory Visit: Payer: Self-pay | Admitting: Hematology and Oncology

## 2020-05-06 ENCOUNTER — Encounter: Payer: Self-pay | Admitting: Hematology and Oncology

## 2020-05-06 VITALS — BP 138/89 | HR 75 | Temp 97.7°F | Resp 18 | Ht 73.0 in | Wt 268.0 lb

## 2020-05-06 DIAGNOSIS — Z79899 Other long term (current) drug therapy: Secondary | ICD-10-CM | POA: Diagnosis not present

## 2020-05-06 DIAGNOSIS — C8170 Other classical Hodgkin lymphoma, unspecified site: Secondary | ICD-10-CM

## 2020-05-06 DIAGNOSIS — Z791 Long term (current) use of non-steroidal anti-inflammatories (NSAID): Secondary | ICD-10-CM | POA: Diagnosis not present

## 2020-05-06 DIAGNOSIS — J449 Chronic obstructive pulmonary disease, unspecified: Secondary | ICD-10-CM | POA: Diagnosis not present

## 2020-05-06 DIAGNOSIS — C8174 Other classical Hodgkin lymphoma, lymph nodes of axilla and upper limb: Secondary | ICD-10-CM | POA: Diagnosis not present

## 2020-05-06 DIAGNOSIS — E041 Nontoxic single thyroid nodule: Secondary | ICD-10-CM | POA: Diagnosis not present

## 2020-05-06 DIAGNOSIS — Z7952 Long term (current) use of systemic steroids: Secondary | ICD-10-CM | POA: Diagnosis not present

## 2020-05-06 DIAGNOSIS — Z5111 Encounter for antineoplastic chemotherapy: Secondary | ICD-10-CM | POA: Diagnosis not present

## 2020-05-06 DIAGNOSIS — Z96611 Presence of right artificial shoulder joint: Secondary | ICD-10-CM | POA: Diagnosis not present

## 2020-05-06 DIAGNOSIS — K219 Gastro-esophageal reflux disease without esophagitis: Secondary | ICD-10-CM | POA: Diagnosis not present

## 2020-05-06 DIAGNOSIS — I10 Essential (primary) hypertension: Secondary | ICD-10-CM | POA: Diagnosis not present

## 2020-05-06 DIAGNOSIS — Z95828 Presence of other vascular implants and grafts: Secondary | ICD-10-CM

## 2020-05-06 LAB — CMP (CANCER CENTER ONLY)
ALT: 26 U/L (ref 0–44)
AST: 17 U/L (ref 15–41)
Albumin: 3.6 g/dL (ref 3.5–5.0)
Alkaline Phosphatase: 88 U/L (ref 38–126)
Anion gap: 8 (ref 5–15)
BUN: 14 mg/dL (ref 8–23)
CO2: 26 mmol/L (ref 22–32)
Calcium: 9.4 mg/dL (ref 8.9–10.3)
Chloride: 108 mmol/L (ref 98–111)
Creatinine: 0.91 mg/dL (ref 0.61–1.24)
GFR, Est AFR Am: >60 mL/min (ref >60)
GFR, Estimated: >60 mL/min (ref >60)
Glucose, Bld: 92 mg/dL (ref 70–99)
Potassium: 4 mmol/L (ref 3.5–5.1)
Sodium: 142 mmol/L (ref 135–145)
Total Bilirubin: 0.2 mg/dL — ABNORMAL LOW (ref 0.3–1.2)
Total Protein: 6.7 g/dL (ref 6.5–8.1)

## 2020-05-06 LAB — CBC WITH DIFFERENTIAL (CANCER CENTER ONLY)
Abs Immature Granulocytes: 0.02 10*3/uL (ref 0.00–0.07)
Basophils Absolute: 0.1 10*3/uL (ref 0.0–0.1)
Basophils Relative: 1 %
Eosinophils Absolute: 0.1 10*3/uL (ref 0.0–0.5)
Eosinophils Relative: 2 %
HCT: 39.2 % (ref 39.0–52.0)
Hemoglobin: 12.8 g/dL — ABNORMAL LOW (ref 13.0–17.0)
Immature Granulocytes: 0 %
Lymphocytes Relative: 30 %
Lymphs Abs: 2.1 10*3/uL (ref 0.7–4.0)
MCH: 29.4 pg (ref 26.0–34.0)
MCHC: 32.7 g/dL (ref 30.0–36.0)
MCV: 89.9 fL (ref 80.0–100.0)
Monocytes Absolute: 1.2 10*3/uL — ABNORMAL HIGH (ref 0.1–1.0)
Monocytes Relative: 17 %
Neutro Abs: 3.5 10*3/uL (ref 1.7–7.7)
Neutrophils Relative %: 50 %
Platelet Count: 295 10*3/uL (ref 150–400)
RBC: 4.36 MIL/uL (ref 4.22–5.81)
RDW: 15.8 % — ABNORMAL HIGH (ref 11.5–15.5)
WBC Count: 7 10*3/uL (ref 4.0–10.5)
nRBC: 0 % (ref 0.0–0.2)

## 2020-05-06 MED ORDER — HEPARIN SOD (PORK) LOCK FLUSH 100 UNIT/ML IV SOLN
500.0000 [IU] | Freq: Once | INTRAVENOUS | Status: AC | PRN
  Administered 2020-05-06: 500 [IU]
  Filled 2020-05-06: qty 5

## 2020-05-06 MED ORDER — SODIUM CHLORIDE 0.9% FLUSH
10.0000 mL | INTRAVENOUS | Status: DC | PRN
  Administered 2020-05-06: 10 mL
  Filled 2020-05-06: qty 10

## 2020-05-06 NOTE — Progress Notes (Signed)
[place] Telephone:(336) [phone]   [phone]  PROGRESS NOTE  Patient Care Team: [name], MD as PCP - General (Internal Medicine)  Hematological/Oncological History # Classical Hodgkin's Lymphoma, Stage II 1)10/10/2019: CT scan for right shoulder pain showed increased right axillary lymphadenopathy.  2) 12/10/2019: patient underwent US of the right axilla, noted to have right axillary lymphadenopathy 3) 12/15/2019: [place] axillary lymph node biopsy. Pathology confirms Classical Hodgkin's lymphoma 4) 12/24/2019: Establish care with Dr. Lorenso Courier 5)  12/31/2019: PET CT scan showed signs of right axillary adenopathy with increased metabolic activity 6) [id number]: Cycle 1 Day 1 of AVD chemotherapy (Bleomycin to be held for the duration of treatment) 7) 01/29/2020: Cycle 1 Day 14 of AVD chemotherapy  8) 02/12/2020: Cycle 2 Day 1 of AVD chemotherapy  9) 02/26/2020: Cycle 2 Day 15 of AVD chemotherapy  10) 03/11/2020: Cycle 3 Day 1 of AVD chemotherapy  11) 03/25/2020: Cycle 3 Day 15 of AVD chemotherapy  12) 04/08/2020: Cycle 4 Day 1 of AVD chemotherapy  13) 04/22/2020: Cycle 4 Day 15 of AVD chemotherapy. Completion of Chemotherapy regimen 14) 06/16/2020: intended start of radiation therapy.   Interval History:  Marc Watson 63 y.o. male with medical history significant for classical Hodgkin Lymphoma who presents for a follow up visit. The patient's last visit was on 03/25/2020. Today the patient is s/p Cycle 4 of chemotherapy, having completed the chemotherapy treatment regimen. He is now preparing for radiation treatment.   On exam today Marc Watson notes he had an increase in fatigue during this last cycle of chemotherapy compared to his prior cycles.  He reports that he fell asleep early that night on the day of treatment and felt a little bit better on Friday, but then slept most of the weekend.  He is now since rebounded and feels quite well.  He reports that he has been keeping  up with hydration his appetite has been good.  His weight has been stable and has been able to work without any limitations.  He was also inquiring today about the Covid vaccine and whether or not it would be safe for him to take it now.  Overall he notes that he is at his baseline level of health and is eager to get started with his radiation therapy.  A full 10 point ROS is listed below.  MEDICAL HISTORY:  Past Medical History:  Diagnosis Date  . Arthritis   . COPD (chronic obstructive pulmonary disease) ([place])    per CXR on 11/11/2019  . GERD (gastroesophageal reflux disease)    occ  . Hypertension    recently started taking on 11/05/2019   ALLERGIES:  is allergic to no known allergies.  MEDICATIONS:  Current Outpatient Medications  Medication Sig Dispense Refill  . cyclobenzaprine (FLEXERIL) 10 MG tablet Take 10 mg by mouth at bedtime as needed for muscle spasms.     . diphenhydrAMINE (BENADRYL) 25 mg capsule Take 25 mg by mouth every 6 (six) hours as needed.    . lidocaine-prilocaine (EMLA) cream Apply 1 application topically as needed. 30 g 1  . losartan (COZAAR) 50 MG tablet Take 50 mg by mouth daily.    . naproxen (NAPROSYN) 250 MG tablet Take by mouth as needed for moderate pain. As needed     . nicotine (NICODERM CQ - DOSED IN MG/24 HOURS) 14 mg/24hr patch Apply 14mg  patch daily x 6 wk, then 7 mg patch daily x 2 wk 14 patch 2  . nicotine (  NICODERM CQ - DOSED IN MG/24 HR) 7 mg/24hr patch Apply 14mg  patch daily x 6 wk, then 7 mg patch daily x 2 wk 14 patch 0  . Omega-3 Fatty Acids (FISH OIL PO) Take 2 capsules by mouth daily. OMEGA XL PROPRIETARY BLEND 300 MG d-ALPHA TOCOPHEROL (VITAMIN E)/EXTRA VIRGIN OLIVE OIL/EXTRACT ([id number]) CONTAINING OMEGA FATTY ACIDS, GREEN LIPPED MUSSEL (PERNA CANLICULUS) OIL)     . omeprazole (PRILOSEC) 40 MG capsule Take 1 capsule (40 mg total) by mouth daily. (Patient not taking: Reported on 03/29/2020) 90 capsule 3  . ondansetron (ZOFRAN) 8 MG tablet  Take 1 tablet (8 mg total) by mouth every 8 (eight) hours as needed for nausea or vomiting. (Patient not taking: Reported on 03/29/2020) 30 tablet 1  . prochlorperazine (COMPAZINE) 10 MG tablet Take 1 tablet (10 mg total) by mouth every 6 (six) hours as needed for nausea or vomiting. (Patient not taking: Reported on 03/29/2020) 30 tablet 1   No current facility-administered medications for this visit.    REVIEW OF SYSTEMS:   Constitutional: ( - ) fevers, ( - )  chills , ( - ) night sweats (+) itching Eyes: ( - ) blurriness of vision, ( - ) double vision, ( - ) watery eyes Ears, nose, mouth, throat, and face: ( - ) mucositis, ( - ) sore throat Respiratory: ( - ) cough, ( - ) dyspnea, ( - ) wheezes Cardiovascular: ( - ) palpitation, ( - ) chest discomfort, ( - ) lower extremity swelling Gastrointestinal:  ( - ) nausea, ( - ) heartburn, ( - ) change in bowel habits Skin: ( - ) abnormal skin rashes Lymphatics: ( - ) new lymphadenopathy, ( - ) easy bruising Neurological: ( - ) numbness, ( - ) tingling, ( - ) new weaknesses Behavioral/Psych: ( - ) mood change, ( - ) new changes  All other systems were reviewed with the patient and are negative.  PHYSICAL EXAMINATION: ECOG PERFORMANCE STATUS: 0 - Asymptomatic  Vitals:   05/06/20 0956  BP: 138/89  Pulse: 75  Resp: 18  Temp: 97.7 F (36.5 C)  SpO2: 97%   Filed Weights   05/06/20 0956  Weight: 268 lb (121.6 kg)    GENERAL: well appearing middle aged Caucasian male in NAD  SKIN: skin color, texture, turgor are normal, no rashes or significant lesions. Small bruise like lesion on left forearm, smaller than quarter.  EYES: conjunctiva are pink and non-injected, sclera clear LUNGS: clear to auscultation and percussion with normal breathing effort HEART: regular rate & rhythm and no murmurs and no lower extremity edema Musculoskeletal: no cyanosis of digits and no clubbing  PSYCH: alert & oriented x 3, fluent speech NEURO: no focal  motor/sensory deficits  LABORATORY DATA:  I have reviewed the data as listed CBC Latest Ref Rng & Units 05/06/2020 04/22/2020 04/08/2020  WBC 4.0 - 10.5 K/uL 7.0 7.4 7.8  Hemoglobin 13.0 - 17.0 g/dL 12.8(L) 13.0 13.3  Hematocrit 39.0 - 52.0 % 39.2 39.8 40.9  Platelets 150 - 400 K/uL 295 296 291    CMP Latest Ref Rng & Units 05/06/2020 04/22/2020 04/08/2020  Glucose 70 - 99 mg/dL 92 104(H) 104(H)  BUN 8 - 23 mg/dL 14 15 18   Creatinine 0.61 - 1.24 mg/dL 0.91 0.94 0.92  Sodium 135 - 145 mmol/L 142 141 139  Potassium 3.5 - 5.1 mmol/L 4.0 4.2 4.1  Chloride 98 - 111 mmol/L 108 108 108  CO2 22 - 32 mmol/L 26 24 25  Calcium 8.9 - 10.3 mg/dL 9.4 9.2 9.6  Total Protein 6.5 - 8.1 g/dL 6.7 6.7 7.0  Total Bilirubin 0.3 - 1.2 mg/dL 0.2(L) <0.2(L) 0.2(L)  Alkaline Phos 38 - 126 U/L 88 95 91  AST 15 - 41 U/L 17 19 16   ALT 0 - 44 U/L 26 20 20      RADIOGRAPHIC STUDIES: I have personally reviewed the radiological images as listed and agreed with the findings in the report: previously viewed PET CT which showed marked improvement in right axillary FDG avidity. Some residual activity in the neck/AP window.   No results found.  ASSESSMENT & PLAN [place] BON[place]TE 64 y.o. male with medical history significant for classical Hodgkin Lymphoma who presents for a follow up visit. He now presents for post Cycle 4 follow up.   Overall Marc Watson notes that he had increased fatigue with the cycle, but otherwise tolerated it well with no nausea, vomiting or diarrhea.  His appetite remains good and his weight remains stable.  Overall he has tolerated chemotherapy treatment well and is prepared to start radiation therapy after his beach vacation.  The current plan is for AVD chemotherapy x 4 total cycles followed by ISRT. After all treatments would reassess with PET CT scan (recommended within 3 months of completion of therapy). The chemotherapy regimen consisted of doxorubicin 25mg /m2, vinblastine 6mg /m2, and  dacarbazine 375mg /m2 on Day 1 and Day 15. Bleomycin will be held on concern for his lung function and for his age. He received Cycle 1 Day 1 on 01/16/2020.   IPS Score For Hodgkin Lymphoma: 2 (81% OS, 67% freedom from progression)  # Classical Hodgkin's Lymphoma, Stage II (Nonbulky, Favorable) --continue AVD chemotherapy (with no bleomycin). Today marks the end of Cycle 4. He has completed his course of chemotherapy and is now in preparation for radiation treatment.  --restaging post Cycle 2 PET scan performed 03/08/2020, showed significant improvement, with complete resolution of the right axillary adenopathy (the larger previous dominant lymph nodes are currently no longer present compatible with Deauville 1 disease); reduced activity and size of lymph nodes in the neck, currently Deauville 2; and Deauville 2 activity in the AP window lymph node. Next PET to be performed approximately 12 weeks after completion of radiation therapy (Nov 2021).  --patient has undergone appropriate pre-treatment testing including TTE, port placement, and blood work. PFTs were not able to be performed due to issues with scheduling during the [place] pandemic. Bleomycin held due to age and concern for underlying lung disease --stable labs, recheck today.  --appreciate assessment by radiation oncology for post chemotherapy RT to the affected lymph nodes. Simulation planned for 06/16/2020.  --plan to RTC after completion of radiation therapy (August 2021).   #Thyroid Nodule, stable --found incidentally on last PET CT scan on 12/31/2019 --no further imaging f/u required per recommendations on last PET CT --continue to monitor   #Chemotherapy Associated Itching, stable --continue benadryl PRN --patient notes he has some itching on day 1 and 2 of treatment, but not as severe as intial episode.  --call for worsening or intractable itching  #Symptom Management --patient provided with PRN medications for zofran and compazine for  nausea  --recommend omeprazole for heartburn, stomach upset. Patient can use OTC simethicone for excessive gas.  --EMLA cream provided for port  --will continue to monitor through course of treatment   No orders of the defined types were placed in this encounter.  All questions were answered. The patient knows to call the clinic with  any problems, questions or concerns.  A total of more than 30 minutes were spent on this encounter and over half of that time was spent on counseling and coordination of care as outlined above.   Marc Peoples, MD Department of Hematology/Oncology [place] [facility] Phone: [phone] Pager: [phone] Email: [email or web address]  05/06/2020 10:57 AM

## 2020-05-06 NOTE — Patient Instructions (Signed)

## 2020-05-07 LAB — LACTATE DEHYDROGENASE: LDH: 164 U/L (ref 98–192)

## 2020-05-12 ENCOUNTER — Ambulatory Visit: Payer: BC Managed Care – PPO | Admitting: Radiation Oncology

## 2020-05-13 ENCOUNTER — Ambulatory Visit: Payer: BC Managed Care – PPO

## 2020-05-14 ENCOUNTER — Ambulatory Visit: Payer: BC Managed Care – PPO

## 2020-05-17 ENCOUNTER — Ambulatory Visit: Payer: BC Managed Care – PPO

## 2020-05-18 ENCOUNTER — Ambulatory Visit: Payer: BC Managed Care – PPO

## 2020-05-19 ENCOUNTER — Ambulatory Visit: Payer: BC Managed Care – PPO

## 2020-05-20 ENCOUNTER — Ambulatory Visit: Payer: BC Managed Care – PPO

## 2020-05-21 ENCOUNTER — Ambulatory Visit: Payer: BC Managed Care – PPO

## 2020-05-24 ENCOUNTER — Ambulatory Visit: Payer: BC Managed Care – PPO

## 2020-05-25 ENCOUNTER — Ambulatory Visit: Payer: BC Managed Care – PPO

## 2020-05-26 ENCOUNTER — Ambulatory Visit: Payer: BC Managed Care – PPO

## 2020-05-27 ENCOUNTER — Ambulatory Visit: Payer: BC Managed Care – PPO

## 2020-05-28 ENCOUNTER — Ambulatory Visit: Payer: BC Managed Care – PPO

## 2020-05-31 ENCOUNTER — Ambulatory Visit: Payer: BC Managed Care – PPO

## 2020-05-31 ENCOUNTER — Other Ambulatory Visit: Payer: Self-pay

## 2020-05-31 ENCOUNTER — Ambulatory Visit
Admission: RE | Admit: 2020-05-31 | Discharge: 2020-05-31 | Disposition: A | Discharge status: Home / Self Care | Payer: BC Managed Care – PPO | Source: Ambulatory Visit | Attending: Radiation Oncology | Admitting: Radiation Oncology

## 2020-05-31 DIAGNOSIS — C8104 Nodular lymphocyte predominant Hodgkin lymphoma, lymph nodes of axilla and upper limb: Secondary | ICD-10-CM | POA: Insufficient documentation

## 2020-05-31 DIAGNOSIS — C8144 Lymphocyte-rich classical Hodgkin lymphoma, lymph nodes of axilla and upper limb: Secondary | ICD-10-CM

## 2020-05-31 NOTE — Progress Notes (Signed)
I saw the patient today and went through a consent session with him.  Consent has been signed and he will proceed with CT simulation.  We anticipate starting his radiation after his vacation completes in early July. Of note the patient was discussed the ENT tumor board and the decision was made for [place] to target his right axilla alone, as the other nonspecific sites found on PET above the diaphragm were not clearly lymphoma.  -----------------------------------  Eppie Gibson, MD

## 2020-06-01 ENCOUNTER — Ambulatory Visit: Payer: BC Managed Care – PPO

## 2020-06-02 ENCOUNTER — Ambulatory Visit: Payer: BC Managed Care – PPO

## 2020-06-03 ENCOUNTER — Ambulatory Visit: Payer: BC Managed Care – PPO

## 2020-06-09 DIAGNOSIS — C8104 Nodular lymphocyte predominant Hodgkin lymphoma, lymph nodes of axilla and upper limb: Secondary | ICD-10-CM | POA: Diagnosis not present

## 2020-06-16 ENCOUNTER — Other Ambulatory Visit: Payer: Self-pay

## 2020-06-16 ENCOUNTER — Ambulatory Visit
Admission: RE | Admit: 2020-06-16 | Discharge: 2020-06-16 | Disposition: A | Discharge status: Home / Self Care | Payer: BC Managed Care – PPO | Source: Ambulatory Visit | Attending: Radiation Oncology | Admitting: Radiation Oncology

## 2020-06-16 DIAGNOSIS — C8104 Nodular lymphocyte predominant Hodgkin lymphoma, lymph nodes of axilla and upper limb: Secondary | ICD-10-CM | POA: Insufficient documentation

## 2020-06-17 ENCOUNTER — Other Ambulatory Visit: Payer: Self-pay

## 2020-06-17 ENCOUNTER — Ambulatory Visit
Admission: RE | Admit: 2020-06-17 | Discharge: 2020-06-17 | Disposition: A | Discharge status: Home / Self Care | Payer: BC Managed Care – PPO | Source: Ambulatory Visit | Attending: Radiation Oncology | Admitting: Radiation Oncology

## 2020-06-17 DIAGNOSIS — C8104 Nodular lymphocyte predominant Hodgkin lymphoma, lymph nodes of axilla and upper limb: Secondary | ICD-10-CM | POA: Diagnosis not present

## 2020-06-18 ENCOUNTER — Other Ambulatory Visit: Payer: Self-pay

## 2020-06-18 ENCOUNTER — Ambulatory Visit
Admission: RE | Admit: 2020-06-18 | Discharge: 2020-06-18 | Disposition: A | Discharge status: Home / Self Care | Payer: BC Managed Care – PPO | Source: Ambulatory Visit | Attending: Radiation Oncology | Admitting: Radiation Oncology

## 2020-06-18 DIAGNOSIS — C8104 Nodular lymphocyte predominant Hodgkin lymphoma, lymph nodes of axilla and upper limb: Secondary | ICD-10-CM | POA: Diagnosis not present

## 2020-06-19 ENCOUNTER — Ambulatory Visit: Payer: BC Managed Care – PPO

## 2020-06-20 ENCOUNTER — Ambulatory Visit: Payer: BC Managed Care – PPO

## 2020-06-21 ENCOUNTER — Ambulatory Visit
Admission: RE | Admit: 2020-06-21 | Discharge: 2020-06-21 | Disposition: A | Discharge status: Home / Self Care | Payer: BC Managed Care – PPO | Source: Ambulatory Visit | Attending: Radiation Oncology | Admitting: Radiation Oncology

## 2020-06-21 ENCOUNTER — Other Ambulatory Visit: Payer: Self-pay

## 2020-06-21 DIAGNOSIS — C8104 Nodular lymphocyte predominant Hodgkin lymphoma, lymph nodes of axilla and upper limb: Secondary | ICD-10-CM | POA: Diagnosis not present

## 2020-06-21 NOTE — Progress Notes (Signed)
Pt here for patient teaching. Reviewed areas of pertinence such as fatigue and skin changes . Pt able to give teach back of to pat skin and use unscented/gentle soap,. Pt demonstrated understanding, needs reinforcement, no evidence of learning, refused teaching and  of information given and will contact nursing with any questions or concerns.     [email or web address]

## 2020-06-22 ENCOUNTER — Ambulatory Visit
Admission: RE | Admit: 2020-06-22 | Discharge: 2020-06-22 | Disposition: A | Discharge status: Home / Self Care | Payer: BC Managed Care – PPO | Source: Ambulatory Visit | Attending: Radiation Oncology | Admitting: Radiation Oncology

## 2020-06-22 ENCOUNTER — Other Ambulatory Visit: Payer: Self-pay

## 2020-06-22 DIAGNOSIS — C8104 Nodular lymphocyte predominant Hodgkin lymphoma, lymph nodes of axilla and upper limb: Secondary | ICD-10-CM | POA: Diagnosis not present

## 2020-06-23 ENCOUNTER — Ambulatory Visit
Admission: RE | Admit: 2020-06-23 | Discharge: 2020-06-23 | Disposition: A | Discharge status: Home / Self Care | Payer: BC Managed Care – PPO | Source: Ambulatory Visit | Attending: Radiation Oncology | Admitting: Radiation Oncology

## 2020-06-23 ENCOUNTER — Other Ambulatory Visit: Payer: Self-pay

## 2020-06-23 DIAGNOSIS — C8104 Nodular lymphocyte predominant Hodgkin lymphoma, lymph nodes of axilla and upper limb: Secondary | ICD-10-CM | POA: Diagnosis not present

## 2020-06-24 ENCOUNTER — Ambulatory Visit
Admission: RE | Admit: 2020-06-24 | Discharge: 2020-06-24 | Disposition: A | Discharge status: Home / Self Care | Payer: BC Managed Care – PPO | Source: Ambulatory Visit | Attending: Radiation Oncology | Admitting: Radiation Oncology

## 2020-06-24 ENCOUNTER — Other Ambulatory Visit: Payer: Self-pay

## 2020-06-24 DIAGNOSIS — C8104 Nodular lymphocyte predominant Hodgkin lymphoma, lymph nodes of axilla and upper limb: Secondary | ICD-10-CM | POA: Diagnosis not present

## 2020-06-25 ENCOUNTER — Ambulatory Visit
Admission: RE | Admit: 2020-06-25 | Discharge: 2020-06-25 | Disposition: A | Discharge status: Home / Self Care | Payer: BC Managed Care – PPO | Source: Ambulatory Visit | Attending: Radiation Oncology | Admitting: Radiation Oncology

## 2020-06-25 ENCOUNTER — Other Ambulatory Visit: Payer: Self-pay

## 2020-06-25 DIAGNOSIS — C8104 Nodular lymphocyte predominant Hodgkin lymphoma, lymph nodes of axilla and upper limb: Secondary | ICD-10-CM | POA: Diagnosis not present

## 2020-06-28 ENCOUNTER — Ambulatory Visit
Admission: RE | Admit: 2020-06-28 | Discharge: 2020-06-28 | Disposition: A | Discharge status: Home / Self Care | Payer: BC Managed Care – PPO | Source: Ambulatory Visit | Attending: Radiation Oncology | Admitting: Radiation Oncology

## 2020-06-28 ENCOUNTER — Other Ambulatory Visit: Payer: Self-pay

## 2020-06-28 DIAGNOSIS — C8104 Nodular lymphocyte predominant Hodgkin lymphoma, lymph nodes of axilla and upper limb: Secondary | ICD-10-CM | POA: Diagnosis not present

## 2020-06-29 ENCOUNTER — Ambulatory Visit
Admission: RE | Admit: 2020-06-29 | Discharge: 2020-06-29 | Disposition: A | Discharge status: Home / Self Care | Payer: BC Managed Care – PPO | Source: Ambulatory Visit | Attending: Radiation Oncology | Admitting: Radiation Oncology

## 2020-06-29 ENCOUNTER — Other Ambulatory Visit: Payer: Self-pay

## 2020-06-29 DIAGNOSIS — C8104 Nodular lymphocyte predominant Hodgkin lymphoma, lymph nodes of axilla and upper limb: Secondary | ICD-10-CM | POA: Diagnosis not present

## 2020-06-30 ENCOUNTER — Other Ambulatory Visit: Payer: Self-pay

## 2020-06-30 ENCOUNTER — Ambulatory Visit
Admission: RE | Admit: 2020-06-30 | Discharge: 2020-06-30 | Disposition: A | Discharge status: Home / Self Care | Payer: BC Managed Care – PPO | Source: Ambulatory Visit | Attending: Radiation Oncology | Admitting: Radiation Oncology

## 2020-06-30 DIAGNOSIS — C8104 Nodular lymphocyte predominant Hodgkin lymphoma, lymph nodes of axilla and upper limb: Secondary | ICD-10-CM | POA: Diagnosis not present

## 2020-07-01 ENCOUNTER — Other Ambulatory Visit: Payer: Self-pay

## 2020-07-01 ENCOUNTER — Ambulatory Visit
Admission: RE | Admit: 2020-07-01 | Discharge: 2020-07-01 | Disposition: A | Discharge status: Home / Self Care | Payer: BC Managed Care – PPO | Source: Ambulatory Visit | Attending: Radiation Oncology | Admitting: Radiation Oncology

## 2020-07-01 DIAGNOSIS — C8104 Nodular lymphocyte predominant Hodgkin lymphoma, lymph nodes of axilla and upper limb: Secondary | ICD-10-CM | POA: Diagnosis not present

## 2020-07-02 ENCOUNTER — Other Ambulatory Visit: Payer: Self-pay

## 2020-07-02 ENCOUNTER — Ambulatory Visit
Admission: RE | Admit: 2020-07-02 | Discharge: 2020-07-02 | Disposition: A | Discharge status: Home / Self Care | Payer: BC Managed Care – PPO | Source: Ambulatory Visit | Attending: Radiation Oncology | Admitting: Radiation Oncology

## 2020-07-02 DIAGNOSIS — C8104 Nodular lymphocyte predominant Hodgkin lymphoma, lymph nodes of axilla and upper limb: Secondary | ICD-10-CM | POA: Diagnosis not present

## 2020-07-05 ENCOUNTER — Other Ambulatory Visit: Payer: Self-pay

## 2020-07-05 ENCOUNTER — Ambulatory Visit
Admission: RE | Admit: 2020-07-05 | Discharge: 2020-07-05 | Disposition: A | Discharge status: Home / Self Care | Payer: BC Managed Care – PPO | Source: Ambulatory Visit | Attending: Radiation Oncology | Admitting: Radiation Oncology

## 2020-07-05 DIAGNOSIS — C8104 Nodular lymphocyte predominant Hodgkin lymphoma, lymph nodes of axilla and upper limb: Secondary | ICD-10-CM | POA: Diagnosis not present

## 2020-07-05 DIAGNOSIS — C8144 Lymphocyte-rich classical Hodgkin lymphoma, lymph nodes of axilla and upper limb: Secondary | ICD-10-CM

## 2020-07-05 MED ORDER — SONAFINE EX EMUL
1.0000 "application " | Freq: Two times a day (BID) | CUTANEOUS | Status: DC
  Administered 2020-07-05: 1 via TOPICAL

## 2020-07-06 ENCOUNTER — Ambulatory Visit
Admission: RE | Admit: 2020-07-06 | Discharge: 2020-07-06 | Disposition: A | Discharge status: Home / Self Care | Payer: BC Managed Care – PPO | Source: Ambulatory Visit | Attending: Radiation Oncology | Admitting: Radiation Oncology

## 2020-07-06 ENCOUNTER — Other Ambulatory Visit: Payer: Self-pay

## 2020-07-06 DIAGNOSIS — C8104 Nodular lymphocyte predominant Hodgkin lymphoma, lymph nodes of axilla and upper limb: Secondary | ICD-10-CM | POA: Diagnosis not present

## 2020-07-07 ENCOUNTER — Encounter: Payer: Self-pay | Admitting: Radiation Oncology

## 2020-07-07 ENCOUNTER — Other Ambulatory Visit: Payer: Self-pay

## 2020-07-07 ENCOUNTER — Ambulatory Visit
Admission: RE | Admit: 2020-07-07 | Discharge: 2020-07-07 | Disposition: A | Discharge status: Home / Self Care | Payer: BC Managed Care – PPO | Source: Ambulatory Visit | Attending: Radiation Oncology | Admitting: Radiation Oncology

## 2020-07-07 DIAGNOSIS — C8104 Nodular lymphocyte predominant Hodgkin lymphoma, lymph nodes of axilla and upper limb: Secondary | ICD-10-CM | POA: Diagnosis not present

## 2020-07-08 ENCOUNTER — Ambulatory Visit
Admission: RE | Admit: 2020-07-08 | Discharge: 2020-07-08 | Disposition: A | Discharge status: Home / Self Care | Payer: BC Managed Care – PPO | Source: Ambulatory Visit | Attending: Radiation Oncology | Admitting: Radiation Oncology

## 2020-07-08 ENCOUNTER — Encounter: Payer: Self-pay | Admitting: Radiation Oncology

## 2020-07-08 ENCOUNTER — Other Ambulatory Visit: Payer: Self-pay

## 2020-07-08 DIAGNOSIS — C8104 Nodular lymphocyte predominant Hodgkin lymphoma, lymph nodes of axilla and upper limb: Secondary | ICD-10-CM | POA: Diagnosis not present

## 2020-07-09 DIAGNOSIS — E7800 Pure hypercholesterolemia, unspecified: Secondary | ICD-10-CM | POA: Diagnosis not present

## 2020-07-09 DIAGNOSIS — Z0000 Encounter for general adult medical examination without abnormal findings: Secondary | ICD-10-CM | POA: Diagnosis not present

## 2020-07-14 DIAGNOSIS — Z0000 Encounter for general adult medical examination without abnormal findings: Secondary | ICD-10-CM | POA: Diagnosis not present

## 2020-07-18 ENCOUNTER — Other Ambulatory Visit: Payer: Self-pay | Admitting: Hematology and Oncology

## 2020-07-18 DIAGNOSIS — C8170 Other classical Hodgkin lymphoma, unspecified site: Secondary | ICD-10-CM

## 2020-07-18 NOTE — Progress Notes (Signed)
[place] Telephone:(336) [phone]   Fax:(336) [phone]  PROGRESS NOTE  Patient Care Team: [name], MD as PCP - General (Internal Medicine)  Hematological/Oncological History # Classical Hodgkin's Lymphoma, Stage II 1)10/10/2019: CT scan for right shoulder pain showed increased right axillary lymphadenopathy.  2) 12/10/2019: patient underwent US of the right axilla, noted to have right axillary lymphadenopathy 3) 12/15/2019: [place] axillary lymph node biopsy. Pathology confirms Classical Hodgkin's lymphoma 4) 12/24/2019: Establish care with Dr. Lorenso Courier 5)  12/31/2019: PET CT scan showed signs of right axillary adenopathy with increased metabolic activity 6) 07/17/5783: Cycle 1 Day 1 of AVD chemotherapy (Bleomycin to be held for the duration of treatment) 7) 01/29/2020: Cycle 1 Day 14 of AVD chemotherapy  8) 02/12/2020: Cycle 2 Day 1 of AVD chemotherapy  9) 02/26/2020: Cycle 2 Day 15 of AVD chemotherapy  10) 03/11/2020: Cycle 3 Day 1 of AVD chemotherapy  11) 03/25/2020: Cycle 3 Day 15 of AVD chemotherapy  12) 04/08/2020: Cycle 4 Day 1 of AVD chemotherapy  13) 04/22/2020: Cycle 4 Day 15 of AVD chemotherapy. Completion of Chemotherapy regimen 14) 06/16/2020: start of radiation therapy.  15) 07/08/2020: final radiation treatment.   Interval History:  Marc Watson 64 y.o. male with medical history significant for classical Hodgkin Lymphoma who presents for a follow up visit. The patient's last visit was on 05/06/2020. Today the patient is s/p radiation treatment (ended 07/08/2020).   On exam today Marc Watson notes he tolerated radiation therapy quite well with only some fatigue.  He reports that he has intentionally been trying to lose weight and has dropped from 268 at his height down to 261 pounds.  He notes that he is achieved this by swimming more and eating less fried foods and trying to increase his intake of salmon.  He reports that he received the [employer] vaccine and not  have any side effects as a result of that.  He also notes that recently he was working in the yard developed a rash bilaterally on his arms which is primary care provider informed him was contact dermatitis.  He otherwise denies having issues with fevers, chills, sweats, nausea, vomiting or diarrhea.  A full 10 point ROS is listed below.  MEDICAL HISTORY:  Past Medical History:  Diagnosis Date  . Arthritis   . COPD (chronic obstructive pulmonary disease) ([place])    per CXR on 11/11/2019  . GERD (gastroesophageal reflux disease)    occ  . Hypertension    recently started taking on 11/05/2019   ALLERGIES:  is allergic to no known allergies.  MEDICATIONS:  Current Outpatient Medications  Medication Sig Dispense Refill  . omeprazole (PRILOSEC) 40 MG capsule Take 1 capsule (40 mg total) by mouth daily. 90 capsule 3  . cyclobenzaprine (FLEXERIL) 10 MG tablet Take 10 mg by mouth at bedtime as needed for muscle spasms.     . diphenhydrAMINE (BENADRYL) 25 mg capsule Take 25 mg by mouth every 6 (six) hours as needed.    . lidocaine-prilocaine (EMLA) cream Apply 1 application topically as needed. 30 g 1  . losartan (COZAAR) 50 MG tablet Take 50 mg by mouth daily.    . naproxen (NAPROSYN) 250 MG tablet Take by mouth as needed for moderate pain. As needed     . nicotine (NICODERM CQ - DOSED IN MG/24 HOURS) 14 mg/24hr patch Apply 14mg  patch daily x 6 wk, then 7 mg patch daily x 2 wk 14 patch 2  . nicotine (NICODERM  CQ - DOSED IN MG/24 HR) 7 mg/24hr patch Apply 14mg  patch daily x 6 wk, then 7 mg patch daily x 2 wk 14 patch 0  . Omega-3 Fatty Acids (FISH OIL PO) Take 2 capsules by mouth daily. OMEGA XL PROPRIETARY BLEND 300 MG d-ALPHA TOCOPHEROL (VITAMIN E)/EXTRA VIRGIN OLIVE OIL/EXTRACT (pcso-524) CONTAINING OMEGA FATTY ACIDS, GREEN LIPPED MUSSEL (PERNA CANLICULUS) OIL)     . ondansetron (ZOFRAN) 8 MG tablet Take 1 tablet (8 mg total) by mouth every 8 (eight) hours as needed for nausea or vomiting.  (Patient not taking: Reported on 03/29/2020) 30 tablet 1  . prochlorperazine (COMPAZINE) 10 MG tablet Take 1 tablet (10 mg total) by mouth every 6 (six) hours as needed for nausea or vomiting. (Patient not taking: Reported on 03/29/2020) 30 tablet 1   No current facility-administered medications for this visit.   Facility-Administered Medications Ordered in Other Visits  Medication Dose Route Frequency Provider Last Rate Last Admin  . sodium chloride flush (NS) 0.9 % injection 10 mL  10 mL Intracatheter PRN Orson Slick, MD   10 mL at 07/19/20 1030    REVIEW OF SYSTEMS:   Constitutional: ( - ) fevers, ( - )  chills , ( - ) night sweats (+) itching Eyes: ( - ) blurriness of vision, ( - ) double vision, ( - ) watery eyes Ears, nose, mouth, throat, and face: ( - ) mucositis, ( - ) sore throat Respiratory: ( - ) cough, ( - ) dyspnea, ( - ) wheezes Cardiovascular: ( - ) palpitation, ( - ) chest discomfort, ( - ) lower extremity swelling Gastrointestinal:  ( - ) nausea, ( - ) heartburn, ( - ) change in bowel habits Skin: ( - ) abnormal skin rashes Lymphatics: ( - ) new lymphadenopathy, ( - ) easy bruising Neurological: ( - ) numbness, ( - ) tingling, ( - ) new weaknesses Behavioral/Psych: ( - ) mood change, ( - ) new changes  All other systems were reviewed with the patient and are negative.  PHYSICAL EXAMINATION: ECOG PERFORMANCE STATUS: 0 - Asymptomatic  Vitals:   07/19/20 1052  BP: (!) 141/86  Pulse: 86  Resp: 18  Temp: 98.1 F (36.7 C)  SpO2: 97%   Filed Weights   07/19/20 1052  Weight: 261 lb 11.2 oz (118.7 kg)    GENERAL: well appearing middle aged Caucasian male in NAD  SKIN: skin color, texture, turgor are normal, no rashes or significant. Lesions. Mild rash on arms bilaterally, consistent with contact dermatitis.  EYES: conjunctiva are pink and non-injected, sclera clear LUNGS: clear to auscultation and percussion with normal breathing effort HEART: regular rate &  rhythm and no murmurs and no lower extremity edema Musculoskeletal: no cyanosis of digits and no clubbing  PSYCH: alert & oriented x 3, fluent speech NEURO: no focal motor/sensory deficits  LABORATORY DATA:  I have reviewed the data as listed CBC Latest Ref Rng & Units 07/19/2020 05/06/2020 04/22/2020  WBC 4.0 - 10.5 K/uL 8.1 7.0 7.4  Hemoglobin 13.0 - 17.0 g/dL 13.8 12.8(L) 13.0  Hematocrit 39 - 52 % 41.9 39.2 39.8  Platelets 150 - 400 K/uL 271 295 296    CMP Latest Ref Rng & Units 07/19/2020 05/06/2020 04/22/2020  Glucose 70 - 99 mg/dL 103(H) 92 104(H)  BUN 8 - 23 mg/dL 15 14 15   Creatinine 0.61 - 1.24 mg/dL 1.17 0.91 0.94  Sodium 135 - 145 mmol/L 142 142 141  Potassium 3.5 - 5.1 mmol/L  3.8 4.0 4.2  Chloride 98 - 111 mmol/L 108 108 108  CO2 22 - 32 mmol/L 26 26 24   Calcium 8.9 - 10.3 mg/dL 10.1 9.4 9.2  Total Protein 6.5 - 8.1 g/dL 7.0 6.7 6.7  Total Bilirubin 0.3 - 1.2 mg/dL <0.2(L) 0.2(L) <0.2(L)  Alkaline Phos 38 - 126 U/L 112 88 95  AST 15 - 41 U/L 12(L) 17 19  ALT 0 - 44 U/L 18 26 20      RADIOGRAPHIC STUDIES: I have personally reviewed the radiological images as listed and agreed with the findings in the report: previously viewed PET CT which showed marked improvement in right axillary FDG avidity. Some residual activity in the neck/AP window.   No results found.  ASSESSMENT & PLAN Marc Watson 64 y.o. male with medical history significant for classical Hodgkin Lymphoma who presents for a follow up visit. He now presents for post radiation therapy follow up.   Overall Marc Watson has tolerated his chemotherapy and radiation therapies well with only modest side effects of fatigue.  He notes that he has not had any recent issues with fevers, chills, sweats, nausea, vomiting or diarrhea.  He is having some intentional weight loss.  Otherwise he is quite well.  The current plan is for AVD chemotherapy x 4 total cycles followed by ISRT. After all treatments would reassess with  PET CT scan (recommended within 3 months of completion of therapy). The chemotherapy regimen consisted of doxorubicin 25mg /m2, vinblastine 6mg /m2, and dacarbazine 375mg /m2 on Day 1 and Day 15. Bleomycin will be held on concern for his lung function and for his age. He started Cycle 1 Day 1 on 01/16/2020.   IPS Score For Hodgkin Lymphoma: 2 (81% OS, 67% freedom from progression)  # Classical Hodgkin's Lymphoma, Stage II (Nonbulky, Favorable) --completed AVD chemotherapy (with no bleomycin) and radiation therapy.   --restaging post Cycle 2 PET scan performed 03/08/2020, showed significant improvement, with complete resolution of the right axillary adenopathy (the larger previous dominant lymph nodes are currently no longer present compatible with Deauville 1 disease); reduced activity and size of lymph nodes in the neck, currently Deauville 2; and Deauville 2 activity in the AP window lymph node. Next PET to be performed approximately 12 weeks after completion of radiation therapy (Nov 2021).  --patient has undergone appropriate pre-treatment testing including TTE, port placement, and blood work. PFTs were not able to be performed due to issues with scheduling during the [place] pandemic. Bleomycin held due to age and concern for underlying lung disease --stable labs, recheck today.  --appreciate assessment by radiation oncology for post chemotherapy RT to the affected lymph nodes. Therapy now completed.  --Per NCCN guidelines, will have clinic f/u q 3-6 months for 1-2 years followed by every 6-12 months until year 3, then annually. CT scan no more often than q 6 months for the first 2 years following therapy --plan to RTC after repeat CT (Novemeber 2021).   #Thyroid Nodule, stable --found incidentally on last PET CT scan on 12/31/2019 --no further imaging f/u required per recommendations on last PET CT --continue to monitor    Orders Placed This Encounter  Procedures  . IR Removal Tun Access W/ Port  W/O FL    Standing Status:   Future    Standing Expiration Date:   07/19/2021    Order Specific Question:   Reason for exam:    Answer:   completed treatment, no further chemo planned.    Order Specific Question:  Preferred Imaging Location?    Answer:   [facility]  . CT Soft Tissue Neck W Contrast    Standing Status:   Future    Standing Expiration Date:   07/19/2021    Order Specific Question:   If indicated for the ordered procedure, I authorize the administration of contrast media per Radiology protocol    Answer:   Yes    Order Specific Question:   Preferred imaging location?    Answer:   [facility]    Order Specific Question:   Radiology Contrast Protocol - do NOT remove file path    Answer:   [email or web address]  . CT CHEST ABDOMEN PELVIS W CONTRAST    Standing Status:   Future    Standing Expiration Date:   07/19/2021    Order Specific Question:   Reason for Exam (SYMPTOM  OR DIAGNOSIS REQUIRED)    Answer:   s/p Hodgkin lymphoma treatment, assess for response.    Order Specific Question:   Preferred imaging location?    Answer:   [facility]    Order Specific Question:   Radiology Contrast Protocol - do NOT remove file path    Answer:   \\[email or web address]   All questions were answered. The patient knows to call the clinic with any problems, questions or concerns.  A total of more than 30 minutes were spent on this encounter and over half of that time was spent on counseling and coordination of care as outlined above.   [name], MD Department of Hematology/Oncology [place] [facility] Phone: [phone] Pager: [phone] Email: [email or web address]  07/19/2020 11:40 AM

## 2020-07-19 ENCOUNTER — Inpatient Hospital Stay: Payer: BC Managed Care – PPO

## 2020-07-19 ENCOUNTER — Inpatient Hospital Stay: Payer: BC Managed Care – PPO | Attending: Hematology and Oncology | Admitting: Hematology and Oncology

## 2020-07-19 ENCOUNTER — Other Ambulatory Visit: Payer: Self-pay

## 2020-07-19 VITALS — BP 141/86 | HR 86 | Temp 98.1°F | Resp 18 | Ht 73.0 in | Wt 261.7 lb

## 2020-07-19 DIAGNOSIS — I10 Essential (primary) hypertension: Secondary | ICD-10-CM | POA: Diagnosis not present

## 2020-07-19 DIAGNOSIS — Z79899 Other long term (current) drug therapy: Secondary | ICD-10-CM | POA: Diagnosis not present

## 2020-07-19 DIAGNOSIS — T50905A Adverse effect of unspecified drugs, medicaments and biological substances, initial encounter: Secondary | ICD-10-CM

## 2020-07-19 DIAGNOSIS — L298 Other pruritus: Secondary | ICD-10-CM | POA: Diagnosis not present

## 2020-07-19 DIAGNOSIS — Z95828 Presence of other vascular implants and grafts: Secondary | ICD-10-CM

## 2020-07-19 DIAGNOSIS — C8174 Other classical Hodgkin lymphoma, lymph nodes of axilla and upper limb: Secondary | ICD-10-CM | POA: Insufficient documentation

## 2020-07-19 DIAGNOSIS — J449 Chronic obstructive pulmonary disease, unspecified: Secondary | ICD-10-CM | POA: Diagnosis not present

## 2020-07-19 DIAGNOSIS — Z96611 Presence of right artificial shoulder joint: Secondary | ICD-10-CM | POA: Diagnosis not present

## 2020-07-19 DIAGNOSIS — K219 Gastro-esophageal reflux disease without esophagitis: Secondary | ICD-10-CM | POA: Insufficient documentation

## 2020-07-19 DIAGNOSIS — C8170 Other classical Hodgkin lymphoma, unspecified site: Secondary | ICD-10-CM

## 2020-07-19 DIAGNOSIS — Z7952 Long term (current) use of systemic steroids: Secondary | ICD-10-CM | POA: Diagnosis not present

## 2020-07-19 DIAGNOSIS — E041 Nontoxic single thyroid nodule: Secondary | ICD-10-CM | POA: Insufficient documentation

## 2020-07-19 DIAGNOSIS — Z791 Long term (current) use of non-steroidal anti-inflammatories (NSAID): Secondary | ICD-10-CM | POA: Diagnosis not present

## 2020-07-19 LAB — CBC WITH DIFFERENTIAL (CANCER CENTER ONLY)
Abs Immature Granulocytes: 0.02 10*3/uL (ref 0.00–0.07)
Basophils Absolute: 0.1 10*3/uL (ref 0.0–0.1)
Basophils Relative: 1 %
Eosinophils Absolute: 0.3 10*3/uL (ref 0.0–0.5)
Eosinophils Relative: 4 %
HCT: 41.9 % (ref 39.0–52.0)
Hemoglobin: 13.8 g/dL (ref 13.0–17.0)
Immature Granulocytes: 0 %
Lymphocytes Relative: 22 %
Lymphs Abs: 1.7 10*3/uL (ref 0.7–4.0)
MCH: 29 pg (ref 26.0–34.0)
MCHC: 32.9 g/dL (ref 30.0–36.0)
MCV: 88 fL (ref 80.0–100.0)
Monocytes Absolute: 0.9 10*3/uL (ref 0.1–1.0)
Monocytes Relative: 11 %
Neutro Abs: 5 10*3/uL (ref 1.7–7.7)
Neutrophils Relative %: 62 %
Platelet Count: 271 10*3/uL (ref 150–400)
RBC: 4.76 MIL/uL (ref 4.22–5.81)
RDW: 13.7 % (ref 11.5–15.5)
WBC Count: 8.1 10*3/uL (ref 4.0–10.5)
nRBC: 0 % (ref 0.0–0.2)

## 2020-07-19 LAB — CMP (CANCER CENTER ONLY)
ALT: 18 U/L (ref 0–44)
AST: 12 U/L — ABNORMAL LOW (ref 15–41)
Albumin: 3.6 g/dL (ref 3.5–5.0)
Alkaline Phosphatase: 112 U/L (ref 38–126)
Anion gap: 8 (ref 5–15)
BUN: 15 mg/dL (ref 8–23)
CO2: 26 mmol/L (ref 22–32)
Calcium: 10.1 mg/dL (ref 8.9–10.3)
Chloride: 108 mmol/L (ref 98–111)
Creatinine: 1.17 mg/dL (ref 0.61–1.24)
GFR, Est AFR Am: >60 mL/min (ref >60)
GFR, Estimated: >60 mL/min (ref >60)
Glucose, Bld: 103 mg/dL — ABNORMAL HIGH (ref 70–99)
Potassium: 3.8 mmol/L (ref 3.5–5.1)
Sodium: 142 mmol/L (ref 135–145)
Total Bilirubin: <0.2 mg/dL — ABNORMAL LOW (ref 0.3–1.2)
Total Protein: 7 g/dL (ref 6.5–8.1)

## 2020-07-19 LAB — LACTATE DEHYDROGENASE: LDH: 164 U/L (ref 98–192)

## 2020-07-19 MED ORDER — SODIUM CHLORIDE 0.9% FLUSH
10.0000 mL | INTRAVENOUS | Status: DC | PRN
  Administered 2020-07-19: 10 mL
  Filled 2020-07-19: qty 10

## 2020-07-19 MED ORDER — HEPARIN SOD (PORK) LOCK FLUSH 100 UNIT/ML IV SOLN
500.0000 [IU] | Freq: Once | INTRAVENOUS | Status: AC | PRN
  Administered 2020-07-19: 500 [IU]
  Filled 2020-07-19: qty 5

## 2020-07-19 NOTE — Patient Instructions (Signed)

## 2020-07-21 ENCOUNTER — Telehealth: Payer: Self-pay | Admitting: Hematology and Oncology

## 2020-07-21 NOTE — Telephone Encounter (Signed)
Scheduled per 8/9 los. Pt is aware of appt time and date. Mailing pt appt calender per pt request.

## 2020-08-06 ENCOUNTER — Other Ambulatory Visit: Payer: Self-pay | Admitting: Hematology and Oncology

## 2020-08-09 ENCOUNTER — Telehealth: Payer: Self-pay | Admitting: *Deleted

## 2020-08-09 NOTE — Telephone Encounter (Signed)
Pantoprazole prior authorization denied.    Has not tried and failed two (2) OTC generic proton pump inhibitor medications (Esometrazole, Lansoprazole, Lansoprazole etc.). Note: The alternative medications are not covered by pharmacy benefits.    Marland Kitchen

## 2020-08-10 ENCOUNTER — Other Ambulatory Visit: Payer: Self-pay | Admitting: Radiology

## 2020-08-10 MED ORDER — SODIUM CHLORIDE 0.9 % IV SOLN
INTRAVENOUS | Status: AC
Start: 2020-08-12 — End: ?

## 2020-08-10 MED ORDER — DEXTROSE 5 % IV SOLN
2.0000 g | INTRAVENOUS | Status: AC
Start: 2020-08-12 — End: 2020-08-13

## 2020-08-11 ENCOUNTER — Telehealth: Payer: Self-pay

## 2020-08-11 NOTE — Telephone Encounter (Signed)
I called the patient today about their upcoming follow-up appointment in radiation oncology.   Given the state of the  COVID-19 pandemic, concerning case numbers in our community, and guidance from [facility], I offered a phone assessment with the patient to determine if coming to the clinic was necessary. The patient accepted.  I let the patient know that I had spoken with Dr. Isidore Moos, and she wanted them to know the importance of washing their hands for at least 20 seconds at a time, especially after going out in public, and before they eat. Limit going out in public whenever possible. Do not touch your face, unless your hands are clean, such as when bathing. Get plenty of rest, eat well, and stay hydrated. Patient verbalized understanding and agreement,  Symptomatically, the patient is doing relatively well. They report improvement in fatigue and feeling almost back to their baseline energy level. He denies any pain or discomfort to right axilla, and states he has good range of motion to shoulder (though he's still regaining mobility from shoulder surgery in December 2020). Denies any swelling to axilla or skin concerns in treatment field. He reports he is scheduled to have his port-a-cath removed tomorrow.Overall he reports he feels well, and is pleased with his progress since completing radiation   All questions were answered to the patient's satisfaction.  I encouraged the patient to call with any further questions. Otherwise, the plan is follow up with Dr. Narda Rutherford on 10/21/2020, and follow-up with radiation oncology as needed.    Patient is pleased with this plan, and we will cancel their upcoming follow-up to reduce the risk of COVID-19 transmission.

## 2020-08-12 ENCOUNTER — Ambulatory Visit (HOSPITAL_COMMUNITY)
Admission: RE | Admit: 2020-08-12 | Discharge: 2020-08-12 | Disposition: A | Discharge status: Home / Self Care | Payer: BC Managed Care – PPO | Source: Ambulatory Visit | Attending: Hematology and Oncology | Admitting: Hematology and Oncology

## 2020-08-12 ENCOUNTER — Other Ambulatory Visit: Payer: Self-pay

## 2020-08-12 ENCOUNTER — Encounter (HOSPITAL_COMMUNITY): Payer: Self-pay

## 2020-08-12 DIAGNOSIS — Z96642 Presence of left artificial hip joint: Secondary | ICD-10-CM | POA: Diagnosis not present

## 2020-08-12 DIAGNOSIS — I10 Essential (primary) hypertension: Secondary | ICD-10-CM | POA: Insufficient documentation

## 2020-08-12 DIAGNOSIS — Z96611 Presence of right artificial shoulder joint: Secondary | ICD-10-CM | POA: Insufficient documentation

## 2020-08-12 DIAGNOSIS — K219 Gastro-esophageal reflux disease without esophagitis: Secondary | ICD-10-CM | POA: Insufficient documentation

## 2020-08-12 DIAGNOSIS — J449 Chronic obstructive pulmonary disease, unspecified: Secondary | ICD-10-CM | POA: Insufficient documentation

## 2020-08-12 DIAGNOSIS — Z800 Family history of malignant neoplasm of digestive organs: Secondary | ICD-10-CM | POA: Diagnosis not present

## 2020-08-12 DIAGNOSIS — Z79899 Other long term (current) drug therapy: Secondary | ICD-10-CM | POA: Insufficient documentation

## 2020-08-12 DIAGNOSIS — Z808 Family history of malignant neoplasm of other organs or systems: Secondary | ICD-10-CM | POA: Insufficient documentation

## 2020-08-12 DIAGNOSIS — F17210 Nicotine dependence, cigarettes, uncomplicated: Secondary | ICD-10-CM | POA: Insufficient documentation

## 2020-08-12 DIAGNOSIS — Z8249 Family history of ischemic heart disease and other diseases of the circulatory system: Secondary | ICD-10-CM | POA: Diagnosis not present

## 2020-08-12 DIAGNOSIS — Z8571 Personal history of Hodgkin lymphoma: Secondary | ICD-10-CM | POA: Insufficient documentation

## 2020-08-12 DIAGNOSIS — M1990 Unspecified osteoarthritis, unspecified site: Secondary | ICD-10-CM | POA: Insufficient documentation

## 2020-08-12 DIAGNOSIS — C8170 Other classical Hodgkin lymphoma, unspecified site: Secondary | ICD-10-CM

## 2020-08-12 DIAGNOSIS — Z452 Encounter for adjustment and management of vascular access device: Secondary | ICD-10-CM | POA: Diagnosis not present

## 2020-08-12 DIAGNOSIS — Z803 Family history of malignant neoplasm of breast: Secondary | ICD-10-CM | POA: Diagnosis not present

## 2020-08-12 HISTORY — PX: IR REMOVAL TUN ACCESS W/ PORT W/O FL MOD SED: IMG2290

## 2020-08-12 LAB — CBC WITH DIFFERENTIAL/PLATELET
Abs Immature Granulocytes: 0.05 10*3/uL (ref 0.00–0.07)
Basophils Absolute: 0.1 10*3/uL (ref 0.0–0.1)
Basophils Relative: 1 %
Eosinophils Absolute: 0.2 10*3/uL (ref 0.0–0.5)
Eosinophils Relative: 2 %
HCT: 40 % (ref 39.0–52.0)
Hemoglobin: 13.2 g/dL (ref 13.0–17.0)
Immature Granulocytes: 1 %
Lymphocytes Relative: 15 %
Lymphs Abs: 1.5 10*3/uL (ref 0.7–4.0)
MCH: 29.2 pg (ref 26.0–34.0)
MCHC: 33 g/dL (ref 30.0–36.0)
MCV: 88.5 fL (ref 80.0–100.0)
Monocytes Absolute: 1 10*3/uL (ref 0.1–1.0)
Monocytes Relative: 10 %
Neutro Abs: 7.6 10*3/uL (ref 1.7–7.7)
Neutrophils Relative %: 71 %
Platelets: 311 10*3/uL (ref 150–400)
RBC: 4.52 MIL/uL (ref 4.22–5.81)
RDW: 14 % (ref 11.5–15.5)
WBC: 10.5 10*3/uL (ref 4.0–10.5)
nRBC: 0 % (ref 0.0–0.2)

## 2020-08-12 LAB — PROTIME-INR
INR: 1 (ref 0.8–1.2)
Prothrombin Time: 12.6 seconds (ref 11.4–15.2)

## 2020-08-12 MED ORDER — MIDAZOLAM HCL 2 MG/2ML IJ SOLN
INTRAMUSCULAR | Status: AC
  Filled 2020-08-12: qty 2

## 2020-08-12 MED ORDER — FENTANYL CITRATE (PF) 100 MCG/2ML IJ SOLN
INTRAMUSCULAR | Status: AC | PRN
  Administered 2020-08-12 (×2): 50 ug via INTRAVENOUS

## 2020-08-12 MED ORDER — FENTANYL CITRATE (PF) 100 MCG/2ML IJ SOLN
INTRAMUSCULAR | Status: AC
  Filled 2020-08-12: qty 2

## 2020-08-12 MED ORDER — MIDAZOLAM HCL 2 MG/2ML IJ SOLN
INTRAMUSCULAR | Status: AC | PRN
  Administered 2020-08-12 (×2): 1 mg via INTRAVENOUS

## 2020-08-12 MED ORDER — CEFAZOLIN SODIUM-DEXTROSE 2-4 GM/100ML-% IV SOLN
INTRAVENOUS | Status: AC
  Administered 2020-08-12: 2 g via INTRAVENOUS
  Filled 2020-08-12: qty 100

## 2020-08-12 MED ORDER — LIDOCAINE HCL 1 % IJ SOLN
INTRAMUSCULAR | Status: AC
  Filled 2020-08-12: qty 20

## 2020-08-12 MED ORDER — CEFAZOLIN SODIUM-DEXTROSE 2-4 GM/100ML-% IV SOLN: 2.0000 g | INTRAVENOUS | Status: AC

## 2020-08-12 MED ORDER — LIDOCAINE HCL (PF) 1 % IJ SOLN
INTRAMUSCULAR | Status: AC | PRN
  Administered 2020-08-12: 10 mL via INTRADERMAL

## 2020-08-12 MED ORDER — SODIUM CHLORIDE 0.9 % IV SOLN: INTRAVENOUS | Status: DC

## 2020-08-12 NOTE — Procedures (Signed)
Interventional Radiology Procedure Note  Procedure: Port removal  Complications: None  Estimated Blood Loss: < 10 mL  Findings: Right chest port removed in entirety. Wound closed.  Alphia Behanna T. Jordany Russett, M.D Pager:  [phone]    

## 2020-08-12 NOTE — H&P (Signed)
Chief Complaint: Patient was seen in consultation today for Hodgkin's lymphoma in remission/Port-a-cath removal.  Referring Physician(s): Corydon T IV  Supervising Physician: Aletta Edouard  Patient Status: [facility] - Out-pt  History of Present Illness: Marc Watson is a 64 y.o. male with a past medical history of hypertension, COPD, GERD, Hodgkin's lymphoma, and arthritis. He was unfortunately diagnosed with Hodgkin's lymphoma in 12/2019. His cancer is managed by Dr. Lorenso Courier. He had a Port-a-cath placed in IR 01/05/2020 by Dr. Earleen Newport. He has completed a course of systemic chemotherapy and radiation therapy, and is in remission at this time.  IR requested by Dr. Lorenso Courier for possible Port-a-cath removal. Patient awake and alert laying in bed watching TV with no complaints at this time. Denies fever, chills, chest pain, dyspnea, abdominal pain, or headache.   Past Medical History:  Diagnosis Date  . Arthritis   . COPD (chronic obstructive pulmonary disease) ([place])    per CXR on 11/11/2019  . GERD (gastroesophageal reflux disease)    occ  . Hypertension    recently started taking on 11/05/2019    Past Surgical History:  Procedure Laterality Date  . IR IMAGING GUIDED PORT INSERTION  01/05/2020  . JOINT REPLACEMENT  2018   left hip  . KNEE SURGERY Right    cartilage  . LEG SURGERY Left 1969   "stunted growth left leg"  . TOTAL HIP ARTHROPLASTY Left 03/27/2017  . TOTAL HIP ARTHROPLASTY Left 03/27/2017   Procedure: TOTAL HIP ARTHROPLASTY ANTERIOR APPROACH;  Surgeon: Melrose Nakayama, MD;  Location: [facility];  Service: Orthopedics;  Laterality: Left;  . TOTAL SHOULDER ARTHROPLASTY Right 11/13/2019   Procedure: TOTAL SHOULDER ARTHROPLASTY;  Surgeon: Tania Ade, MD;  Location: [facility];  Service: Orthopedics;  Laterality: Right;    Allergies: No known allergies  Medications: Prior to Admission medications   Medication Sig Start Date End Date Taking? Authorizing Provider    cyclobenzaprine (FLEXERIL) 10 MG tablet Take 10 mg by mouth at bedtime as needed for muscle spasms.  08/08/19  Yes [provider]  diphenhydrAMINE (BENADRYL) 25 mg capsule Take 25 mg by mouth every 6 (six) hours as needed.   Yes [provider]  lidocaine-prilocaine (EMLA) cream Apply 1 application topically as needed. 01/14/20  Yes Orson Slick, MD  losartan (COZAAR) 50 MG tablet Take 50 mg by mouth daily. 11/12/19  Yes [provider]  naproxen (NAPROSYN) 250 MG tablet Take by mouth as needed for moderate pain. As needed    Yes [provider]  Omega-3 Fatty Acids (FISH OIL PO) Take 2 capsules by mouth daily. OMEGA XL PROPRIETARY BLEND 300 MG d-ALPHA TOCOPHEROL (VITAMIN E)/EXTRA VIRGIN OLIVE OIL/EXTRACT (pcso-524) CONTAINING OMEGA FATTY ACIDS, GREEN LIPPED MUSSEL (PERNA CANLICULUS) OIL)    Yes [provider]  nicotine (NICODERM CQ - DOSED IN MG/24 HOURS) 14 mg/24hr patch Apply 14mg  patch daily x 6 wk, then 7 mg patch daily x 2 wk 03/29/20   Eppie Gibson, MD  nicotine (NICODERM CQ - DOSED IN MG/24 HR) 7 mg/24hr patch Apply 14mg  patch daily x 6 wk, then 7 mg patch daily x 2 wk 03/29/20   Eppie Gibson, MD  ondansetron (ZOFRAN) 8 MG tablet Take 1 tablet (8 mg total) by mouth every 8 (eight) hours as needed for nausea or vomiting. Patient not taking: Reported on 03/29/2020 01/14/20   Orson Slick, MD  prochlorperazine (COMPAZINE) 10 MG tablet Take 1 tablet (10 mg total) by mouth every 6 (six) hours  as needed for nausea or vomiting. Patient not taking: Reported on 03/29/2020 01/14/20   Orson Slick, MD  PROTONIX 40 MG tablet EVERY DAY 08/06/20   Orson Slick, MD     Family History  Problem Relation Age of Onset  . Heart disease Mother   . Skin cancer Father   . Breast cancer Sister   . Skin cancer Brother   . Colon cancer Maternal Uncle   . Alzheimer's disease Paternal Grandmother   . Heart attack Paternal Grandfather     Social History    Socioeconomic History  . Marital status: Married    Spouse name: Not on file  . Number of children: Not on file  . Years of education: Not on file  . Highest education level: Not on file  Occupational History  . Not on file  Tobacco Use  . Smoking status: Current Every Day Smoker    Packs/day: 0.50    Years: 30.00    Pack years: 15.00  . Smokeless tobacco: Never Used  . Tobacco comment: Trying to  wean off  Vaping Use  . Vaping Use: Never used  Substance and Sexual Activity  . Alcohol use: Yes    Comment: occassionally  . Drug use: Yes    Types: Cocaine    Comment: last used 02/2017- none since  . Sexual activity: Not Currently  Other Topics Concern  . Not on file  Social History Narrative  . Not on file   Social Determinants of Health   Financial Resource Strain:   . Difficulty of Paying Living Expenses: Not on file  Food Insecurity:   . Worried About Charity fundraiser in the Last Year: Not on file  . Ran Out of Food in the Last Year: Not on file  Transportation Needs:   . Lack of Transportation (Medical): Not on file  . Lack of Transportation (Non-Medical): Not on file  Physical Activity:   . Days of Exercise per Week: Not on file  . Minutes of Exercise per Session: Not on file  Stress:   . Feeling of Stress : Not on file  Social Connections:   . Frequency of Communication with Friends and Family: Not on file  . Frequency of Social Gatherings with Friends and Family: Not on file  . Attends Religious Services: Not on file  . Active Member of Clubs or Organizations: Not on file  . Attends Archivist Meetings: Not on file  . Marital Status: Not on file     Review of Systems: A 12 point ROS discussed and pertinent positives are indicated in the HPI above.  All other systems are negative.  Review of Systems  Constitutional: Negative for chills and fever.  Respiratory: Negative for shortness of breath and wheezing.   Cardiovascular: Negative for  chest pain and palpitations.  Gastrointestinal: Negative for abdominal pain.  Neurological: Negative for headaches.  Psychiatric/Behavioral: Negative for behavioral problems and confusion.    Vital Signs: BP 140/89   Pulse 78   Temp 98.1 F (36.7 C) (Oral)   Resp 16   Ht 6\' 1"  (1.854 m)   Wt 256 lb 9.6 oz (116.4 kg)   SpO2 96%   BMI 33.85 kg/m   Physical Exam Vitals and nursing note reviewed.  Constitutional:      General: He is not in acute distress.    Appearance: Normal appearance.  Cardiovascular:     Rate and Rhythm: Normal rate and regular rhythm.  Heart sounds: Normal heart sounds. No murmur heard.   Pulmonary:     Effort: Pulmonary effort is normal. No respiratory distress.     Breath sounds: Normal breath sounds. No wheezing.  Skin:    General: Skin is warm and dry.  Neurological:     Mental Status: He is alert and oriented to person, place, and time.      MD Evaluation Airway: WNL Heart: WNL Abdomen: WNL Chest/ Lungs: WNL ASA  Classification: 3 Mallampati/Airway Score: Two   Imaging: No results found.  Labs:  CBC: Recent Labs    04/22/20 1014 05/06/20 0935 07/19/20 1030 08/12/20 0817  WBC 7.4 7.0 8.1 10.5  HGB 13.0 12.8* 13.8 13.2  HCT 39.8 39.2 41.9 40.0  PLT 296 295 271 311    COAGS: Recent Labs    11/11/19 0851 08/12/20 0817  INR 0.9 1.0  APTT 30  --     BMP: Recent Labs    04/08/20 1032 04/22/20 1014 05/06/20 0935 07/19/20 1030  NA 139 141 142 142  K 4.1 4.2 4.0 3.8  CL 108 108 108 108  CO2 25 24 26 26   GLUCOSE 104* 104* 92 103*  BUN 18 15 14 15   CALCIUM 9.6 9.2 9.4 10.1  CREATININE 0.92 0.94 0.91 1.17  GFRNONAA >60 >60 >60 >60  GFRAA >60 >60 >60 >60    LIVER FUNCTION TESTS: Recent Labs    04/08/20 1032 04/22/20 1014 05/06/20 0935 07/19/20 1030  BILITOT 0.2* <0.2* 0.2* <0.2*  AST 16 19 17  12*  ALT 20 20 26 18   ALKPHOS 91 95 88 112  PROT 7.0 6.7 6.7 7.0  ALBUMIN 3.7 3.7 3.6 3.6     Assessment  and Plan:  Hodgkin's lymphoma, currently in remission. Plan for Port-a-cath removal today in IR. Patient is NPO. Afebrile. He does not take blood thinners.  Risks and benefits of Port-a-catheter removal were discussed with the patient including, but not limited to bleeding and infection. All of the patient's questions were answered, patient is agreeable to proceed. Consent signed and in chart.   Thank you for this interesting consult.  I greatly enjoyed meeting Marc Watson and look forward to participating in their care.  A copy of this report was sent to the requesting provider on this date.  Electronically Signed: Earley Abide, PA-C 08/12/2020, 9:05 AM   I spent a total of 25 Minutes in face to face in clinical consultation, greater than 50% of which was counseling/coordinating care for Hodgkin's lymphoma in remission/Port-a-cath removal.

## 2020-08-12 NOTE — Discharge Instructions (Addendum)
Please call Interventional Radiology clinic [phone] with any questions or concerns. ° °You may remove your dressing and shower tomorrow. ° ° °Implanted Port Removal, Care After °This sheet gives you information about how to care for yourself after your procedure. Your health care provider may also give you more specific instructions. If you have problems or questions, contact your health care provider. °What can I expect after the procedure? °After the procedure, it is common to have: °· Soreness or pain near your incision. °· Some swelling or bruising near your incision. °Follow these instructions at home: °Medicines °· Take over-the-counter and prescription medicines only as told by your health care provider. °· If you were prescribed an antibiotic medicine, take it as told by your health care provider. Do not stop taking the antibiotic even if you start to feel better. °Bathing °· Do not take baths, swim, or use a hot tub until your health care provider approves. Ask your health care provider if you can take showers. You may only be allowed to take sponge baths. °Incision care ° °· Follow instructions from your health care provider about how to take care of your incision. Make sure you: °? Wash your hands with soap and water before you change your bandage (dressing). If soap and water are not available, use hand sanitizer. °? Change your dressing as told by your health care provider. °? Keep your dressing dry. °? Leave stitches (sutures), skin glue, or adhesive strips in place. These skin closures may need to stay in place for 2 weeks or longer. If adhesive strip edges start to loosen and curl up, you may trim the loose edges. Do not remove adhesive strips completely unless your health care provider tells you to do that. °· Check your incision area every day for signs of infection. Check for: °? More redness, swelling, or pain. °? More fluid or blood. °? Warmth. °? Pus or a bad smell. °Driving ° °· Do not  drive for 24 hours if you were given a medicine to help you relax (sedative) during your procedure. °· If you did not receive a sedative, ask your health care provider when it is safe to drive. °Activity °· Return to your normal activities as told by your health care provider. Ask your health care provider what activities are safe for you. °· Do not lift anything that is heavier than 10 lb (4.5 kg), or the limit that you are told, until your health care provider says that it is safe. °· Do not do activities that involve lifting your arms over your head. °General instructions °· Do not use any products that contain nicotine or tobacco, such as cigarettes and e-cigarettes. These can delay healing. If you need help quitting, ask your health care provider. °· Keep all follow-up visits as told by your health care provider. This is important. °Contact a health care provider if: °· You have more redness, swelling, or pain around your incision. °· You have more fluid or blood coming from your incision. °· Your incision feels warm to the touch. °· You have pus or a bad smell coming from your incision. °· You have pain that is not relieved by your pain medicine. °Get help right away if you have: °· A fever or chills. °· Chest pain. °· Difficulty breathing. °Summary °· After the procedure, it is common to have pain, soreness, swelling, or bruising near your incision. °· If you were prescribed an antibiotic medicine, take it as told by your health   care provider. Do not stop taking the antibiotic even if you start to feel better. °· Do not drive for 24 hours if you were given a sedative during your procedure. °· Return to your normal activities as told by your health care provider. Ask your health care provider what activities are safe for you. °This information is not intended to replace advice given to you by your health care provider. Make sure you discuss any questions you have with your health care provider. °Document  Revised: 01/10/2018 Document Reviewed: 01/10/2018 °Elsevier Patient Education © 2020 [employer] Inc[employer] ° ° °Moderate Conscious Sedation, Adult, Care After °These instructions provide you with information about caring for yourself after your procedure. Your health care provider may also give you more specific instructions. Your treatment has been planned according to current medical practices, but problems sometimes occur. Call your health care provider if you have any problems or questions after your procedure. °What can I expect after the procedure? °After your procedure, it is common: °· To feel sleepy for several hours. °· To feel clumsy and have poor balance for several hours. °· To have poor judgment for several hours. °· To vomit if you eat too soon. °Follow these instructions at home: °For at least 24 hours after the procedure: ° °· Do not: °? Participate in activities where you could fall or become injured. °? Drive. °? Use heavy machinery. °? Drink alcohol. °? Take sleeping pills or medicines that cause drowsiness. °? Make important decisions or sign legal documents. °? Take care of children on your own. °· Rest. °Eating and drinking °· Follow the diet recommended by your health care provider. °· If you vomit: °? Drink water, juice, or soup when you can drink without vomiting. °? Make sure you have little or no nausea before eating solid foods. °General instructions °· Have a responsible adult stay with you until you are awake and alert. °· Take over-the-counter and prescription medicines only as told by your health care provider. °· If you smoke, do not smoke without supervision. °· Keep all follow-up visits as told by your health care provider. This is important. °Contact a health care provider if: °· You keep feeling nauseous or you keep vomiting. °· You feel light-headed. °· You develop a rash. °· You have a fever. °Get help right away if: °· You have trouble breathing. °This information is not intended to  replace advice given to you by your health care provider. Make sure you discuss any questions you have with your health care provider. °Document Revised: 11/09/2017 Document Reviewed: 03/18/2016 °Elsevier Patient Education © 2020 Elsevier Inc. ° ° °

## 2020-08-13 ENCOUNTER — Ambulatory Visit: Payer: Self-pay | Admitting: Radiation Oncology

## 2020-08-18 ENCOUNTER — Telehealth: Payer: Self-pay | Admitting: *Deleted

## 2020-08-18 NOTE — Telephone Encounter (Signed)
Received vm message from pt's wife, Sherlene. She is asking for a call back. Attempted call back. No answer but was able to leave vm message for her to return my call at her earliest convenience.

## 2020-08-19 ENCOUNTER — Telehealth: Payer: Self-pay | Admitting: *Deleted

## 2020-08-19 NOTE — Telephone Encounter (Signed)
TCT Sherlene, pt's wife after receiving vm message from her.  She is asking when to expect Marc Watson's next scan. Advised it would be due in November 2021. Provided # to Radiology Scheduling if she wanted to call to get that scheduled well ahead of time. Her other question was related to [facility] port being removed and when he can resume his regular activities. Advised that he should not do any heavy lifting for 3 weeks and to keep the area clean and dry. He had it removed on 08/12/20. He can certainly take a shower at this time but not to scrub the area, just let the water flow over the healing incision.  Advised to call back if she had any further questions.

## 2020-09-09 NOTE — [place] Notes (Signed)
  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   Plan: The patient will follow-up with radiation oncology in 29mo.  -----------------------------------  Eppie Gibson, MD

## 2020-09-15 DIAGNOSIS — Z1211 Encounter for screening for malignant neoplasm of colon: Secondary | ICD-10-CM | POA: Diagnosis not present

## 2020-10-12 DIAGNOSIS — D124 Benign neoplasm of descending colon: Secondary | ICD-10-CM | POA: Diagnosis not present

## 2020-10-12 DIAGNOSIS — Z1211 Encounter for screening for malignant neoplasm of colon: Secondary | ICD-10-CM | POA: Diagnosis not present

## 2020-10-12 DIAGNOSIS — D122 Benign neoplasm of ascending colon: Secondary | ICD-10-CM | POA: Diagnosis not present

## 2020-10-12 DIAGNOSIS — K5730 Diverticulosis of large intestine without perforation or abscess without bleeding: Secondary | ICD-10-CM | POA: Diagnosis not present

## 2020-10-12 DIAGNOSIS — D120 Benign neoplasm of cecum: Secondary | ICD-10-CM | POA: Diagnosis not present

## 2020-10-12 DIAGNOSIS — K635 Polyp of colon: Secondary | ICD-10-CM | POA: Diagnosis not present

## 2020-10-18 ENCOUNTER — Ambulatory Visit: Payer: BC Managed Care – PPO | Admitting: Hematology and Oncology

## 2020-10-18 ENCOUNTER — Other Ambulatory Visit: Payer: BC Managed Care – PPO

## 2020-10-18 ENCOUNTER — Other Ambulatory Visit: Payer: Self-pay | Admitting: Hematology and Oncology

## 2020-10-18 DIAGNOSIS — C8170 Other classical Hodgkin lymphoma, unspecified site: Secondary | ICD-10-CM

## 2020-10-19 ENCOUNTER — Ambulatory Visit (HOSPITAL_COMMUNITY)
Admission: RE | Admit: 2020-10-19 | Discharge: 2020-10-19 | Disposition: A | Discharge status: Home / Self Care | Payer: BC Managed Care – PPO | Source: Ambulatory Visit | Attending: Hematology and Oncology | Admitting: Hematology and Oncology

## 2020-10-19 ENCOUNTER — Inpatient Hospital Stay: Payer: BC Managed Care – PPO | Attending: Hematology and Oncology

## 2020-10-19 ENCOUNTER — Other Ambulatory Visit: Payer: Self-pay

## 2020-10-19 DIAGNOSIS — K219 Gastro-esophageal reflux disease without esophagitis: Secondary | ICD-10-CM | POA: Insufficient documentation

## 2020-10-19 DIAGNOSIS — Z7952 Long term (current) use of systemic steroids: Secondary | ICD-10-CM | POA: Diagnosis not present

## 2020-10-19 DIAGNOSIS — C8170 Other classical Hodgkin lymphoma, unspecified site: Secondary | ICD-10-CM | POA: Insufficient documentation

## 2020-10-19 DIAGNOSIS — J432 Centrilobular emphysema: Secondary | ICD-10-CM | POA: Diagnosis not present

## 2020-10-19 DIAGNOSIS — J449 Chronic obstructive pulmonary disease, unspecified: Secondary | ICD-10-CM | POA: Insufficient documentation

## 2020-10-19 DIAGNOSIS — Z791 Long term (current) use of non-steroidal anti-inflammatories (NSAID): Secondary | ICD-10-CM | POA: Diagnosis not present

## 2020-10-19 DIAGNOSIS — Z79899 Other long term (current) drug therapy: Secondary | ICD-10-CM | POA: Insufficient documentation

## 2020-10-19 DIAGNOSIS — D3500 Benign neoplasm of unspecified adrenal gland: Secondary | ICD-10-CM | POA: Diagnosis not present

## 2020-10-19 DIAGNOSIS — Z96611 Presence of right artificial shoulder joint: Secondary | ICD-10-CM | POA: Insufficient documentation

## 2020-10-19 DIAGNOSIS — Z8571 Personal history of Hodgkin lymphoma: Secondary | ICD-10-CM | POA: Insufficient documentation

## 2020-10-19 DIAGNOSIS — I10 Essential (primary) hypertension: Secondary | ICD-10-CM | POA: Diagnosis not present

## 2020-10-19 DIAGNOSIS — E042 Nontoxic multinodular goiter: Secondary | ICD-10-CM | POA: Diagnosis not present

## 2020-10-19 DIAGNOSIS — I700 Atherosclerosis of aorta: Secondary | ICD-10-CM | POA: Diagnosis not present

## 2020-10-19 DIAGNOSIS — I2510 Atherosclerotic heart disease of native coronary artery without angina pectoris: Secondary | ICD-10-CM | POA: Diagnosis not present

## 2020-10-19 DIAGNOSIS — C8190 Hodgkin lymphoma, unspecified, unspecified site: Secondary | ICD-10-CM | POA: Diagnosis not present

## 2020-10-19 LAB — CMP (CANCER CENTER ONLY)
ALT: 12 U/L (ref 0–44)
AST: 15 U/L (ref 15–41)
Albumin: 3.7 g/dL (ref 3.5–5.0)
Alkaline Phosphatase: 101 U/L (ref 38–126)
Anion gap: 6 (ref 5–15)
BUN: 13 mg/dL (ref 8–23)
CO2: 28 mmol/L (ref 22–32)
Calcium: 9.2 mg/dL (ref 8.9–10.3)
Chloride: 108 mmol/L (ref 98–111)
Creatinine: 0.99 mg/dL (ref 0.61–1.24)
GFR, Estimated: >60 mL/min (ref >60)
Glucose, Bld: 93 mg/dL (ref 70–99)
Potassium: 4 mmol/L (ref 3.5–5.1)
Sodium: 142 mmol/L (ref 135–145)
Total Bilirubin: 0.3 mg/dL (ref 0.3–1.2)
Total Protein: 6.9 g/dL (ref 6.5–8.1)

## 2020-10-19 LAB — CBC WITH DIFFERENTIAL (CANCER CENTER ONLY)
Abs Immature Granulocytes: 0.03 10*3/uL (ref 0.00–0.07)
Basophils Absolute: 0.1 10*3/uL (ref 0.0–0.1)
Basophils Relative: 1 %
Eosinophils Absolute: 0.3 10*3/uL (ref 0.0–0.5)
Eosinophils Relative: 3 %
HCT: 41.1 % (ref 39.0–52.0)
Hemoglobin: 13.5 g/dL (ref 13.0–17.0)
Immature Granulocytes: 0 %
Lymphocytes Relative: 27 %
Lymphs Abs: 2.2 10*3/uL (ref 0.7–4.0)
MCH: 28.2 pg (ref 26.0–34.0)
MCHC: 32.8 g/dL (ref 30.0–36.0)
MCV: 86 fL (ref 80.0–100.0)
Monocytes Absolute: 0.9 10*3/uL (ref 0.1–1.0)
Monocytes Relative: 10 %
Neutro Abs: 4.8 10*3/uL (ref 1.7–7.7)
Neutrophils Relative %: 59 %
Platelet Count: 343 10*3/uL (ref 150–400)
RBC: 4.78 MIL/uL (ref 4.22–5.81)
RDW: 14.8 % (ref 11.5–15.5)
WBC Count: 8.2 10*3/uL (ref 4.0–10.5)
nRBC: 0 % (ref 0.0–0.2)

## 2020-10-19 LAB — LACTATE DEHYDROGENASE: LDH: 316 U/L — ABNORMAL HIGH (ref 98–192)

## 2020-10-19 MED ORDER — IOHEXOL 300 MG/ML  SOLN
100.0000 mL | Freq: Once | INTRAMUSCULAR | Status: AC | PRN
  Administered 2020-10-19: 100 mL via INTRAVENOUS

## 2020-10-21 ENCOUNTER — Inpatient Hospital Stay (HOSPITAL_BASED_OUTPATIENT_CLINIC_OR_DEPARTMENT_OTHER): Payer: BC Managed Care – PPO | Admitting: Hematology and Oncology

## 2020-10-21 ENCOUNTER — Encounter: Payer: Self-pay | Admitting: Hematology and Oncology

## 2020-10-21 ENCOUNTER — Other Ambulatory Visit: Payer: BC Managed Care – PPO

## 2020-10-21 ENCOUNTER — Other Ambulatory Visit: Payer: Self-pay

## 2020-10-21 VITALS — BP 147/75 | HR 78 | Temp 97.9°F | Resp 18 | Ht 73.0 in | Wt 257.2 lb

## 2020-10-21 DIAGNOSIS — C8170 Other classical Hodgkin lymphoma, unspecified site: Secondary | ICD-10-CM

## 2020-10-21 DIAGNOSIS — E041 Nontoxic single thyroid nodule: Secondary | ICD-10-CM | POA: Diagnosis not present

## 2020-10-21 DIAGNOSIS — Z791 Long term (current) use of non-steroidal anti-inflammatories (NSAID): Secondary | ICD-10-CM | POA: Diagnosis not present

## 2020-10-21 DIAGNOSIS — K219 Gastro-esophageal reflux disease without esophagitis: Secondary | ICD-10-CM | POA: Diagnosis not present

## 2020-10-21 DIAGNOSIS — Z8571 Personal history of Hodgkin lymphoma: Secondary | ICD-10-CM | POA: Diagnosis not present

## 2020-10-21 DIAGNOSIS — J449 Chronic obstructive pulmonary disease, unspecified: Secondary | ICD-10-CM | POA: Diagnosis not present

## 2020-10-21 DIAGNOSIS — Z96611 Presence of right artificial shoulder joint: Secondary | ICD-10-CM | POA: Diagnosis not present

## 2020-10-21 DIAGNOSIS — I10 Essential (primary) hypertension: Secondary | ICD-10-CM | POA: Diagnosis not present

## 2020-10-21 DIAGNOSIS — Z7952 Long term (current) use of systemic steroids: Secondary | ICD-10-CM | POA: Diagnosis not present

## 2020-10-21 DIAGNOSIS — Z79899 Other long term (current) drug therapy: Secondary | ICD-10-CM | POA: Diagnosis not present

## 2020-10-21 NOTE — Progress Notes (Signed)
[place] [phone]   [phone]  PROGRESS NOTE  Patient Care Team: [name], MD as PCP - General (Internal Medicine)  Hematological/Oncological History # Classical Hodgkin's Lymphoma, Stage II in Remission 1)10/10/2019: CT scan for right shoulder pain showed increased right axillary lymphadenopathy.  2) 12/10/2019: patient underwent US of the right axilla, noted to have right axillary lymphadenopathy 3) 12/15/2019: [place] axillary lymph node biopsy. Pathology confirms Classical Hodgkin's lymphoma 4) 12/24/2019: Establish care with Dr. Lorenso Courier 5)  12/31/2019: PET CT scan showed signs of right axillary adenopathy with increased metabolic activity 6) 05/16/4402: Cycle 1 Day 1 of AVD chemotherapy (Bleomycin to be held for the duration of treatment) 7) 01/29/2020: Cycle 1 Day 14 of AVD chemotherapy  8) 02/12/2020: Cycle 2 Day 1 of AVD chemotherapy  9) 02/26/2020: Cycle 2 Day 15 of AVD chemotherapy  10) 03/11/2020: Cycle 3 Day 1 of AVD chemotherapy  11) 03/25/2020: Cycle 3 Day 15 of AVD chemotherapy  12) 04/08/2020: Cycle 4 Day 1 of AVD chemotherapy  13) 04/22/2020: Cycle 4 Day 15 of AVD chemotherapy. Completion of Chemotherapy regimen 14) 06/16/2020: start of radiation therapy.  15) 07/08/2020: final radiation treatment.  16) 10/19/2020: Post treatment PET shows no evidence of residual or recurrent disease.   Interval History:  Marc Watson 64 y.o. male with medical history significant for classical Hodgkin Lymphoma who presents for a follow up visit. The patient's last visit was on 07/19/2020. Today the patient presents for his routine surveillance f/u.   Exam today Mr. Bostic notes that he has been quite well in the interim since his last visit.  He reports that he has intentionally been losing weight as he reached his maximum lifetime weight during chemotherapy treatment.  He notes that he is not getting second helpings of food and is not eating as much.  He  reports that overall his energy levels are quite good.  He does occasionally become fatigued while working during hot days, but has been doing his best to keep up with hydration and overall feels like he is at his baseline level of health.  He denies having issues with fevers, chills, sweats, nausea vomiting or diarrhea.  A full 10 point ROS is listed below.  MEDICAL HISTORY:  Past Medical History:  Diagnosis Date  . Arthritis   . COPD (chronic obstructive pulmonary disease) ([place])    per CXR on 11/11/2019  . GERD (gastroesophageal reflux disease)    occ  . Hypertension    recently started taking on 11/05/2019   ALLERGIES:  is allergic to no known allergies.  MEDICATIONS:  Current Outpatient Medications  Medication Sig Dispense Refill  . cyclobenzaprine (FLEXERIL) 10 MG tablet Take 10 mg by mouth at bedtime as needed for muscle spasms.     . diphenhydrAMINE (BENADRYL) 25 mg capsule Take 25 mg by mouth every 6 (six) hours as needed.    . lidocaine-prilocaine (EMLA) cream Apply 1 application topically as needed. 30 g 1  . losartan (COZAAR) 50 MG tablet Take 50 mg by mouth daily.    . naproxen (NAPROSYN) 250 MG tablet Take by mouth as needed for moderate pain. As needed     . nicotine (NICODERM CQ - DOSED IN MG/24 HOURS) 14 mg/24hr patch Apply 14mg  patch daily x 6 wk, then 7 mg patch daily x 2 wk 14 patch 2  . nicotine (NICODERM CQ - DOSED IN MG/24 HR) 7 mg/24hr patch Apply 14mg  patch daily x 6 wk, then 7  mg patch daily x 2 wk 14 patch 0  . Omega-3 Fatty Acids (FISH OIL PO) Take 2 capsules by mouth daily. OMEGA XL PROPRIETARY BLEND 300 MG d-ALPHA TOCOPHEROL (VITAMIN E)/EXTRA VIRGIN OLIVE OIL/EXTRACT (pcso-524) CONTAINING OMEGA FATTY ACIDS, GREEN LIPPED MUSSEL (PERNA CANLICULUS) OIL)     . ondansetron (ZOFRAN) 8 MG tablet Take 1 tablet (8 mg total) by mouth every 8 (eight) hours as needed for nausea or vomiting. (Patient not taking: Reported on 03/29/2020) 30 tablet 1  . prochlorperazine  (COMPAZINE) 10 MG tablet Take 1 tablet (10 mg total) by mouth every 6 (six) hours as needed for nausea or vomiting. (Patient not taking: Reported on 03/29/2020) 30 tablet 1  . PROTONIX 40 MG tablet EVERY DAY 90 tablet 3   No current facility-administered medications for this visit.   Facility-Administered Medications Ordered in Other Visits  Medication Dose Route Frequency Provider Last Rate Last Admin  . 0.9 %  sodium chloride infusion   Intravenous Continuous Allred, Darrell K, PA-C        REVIEW OF SYSTEMS:   Constitutional: ( - ) fevers, ( - )  chills , ( - ) night sweats (+) itching Eyes: ( - ) blurriness of vision, ( - ) double vision, ( - ) watery eyes Ears, nose, mouth, throat, and face: ( - ) mucositis, ( - ) sore throat Respiratory: ( - ) cough, ( - ) dyspnea, ( - ) wheezes Cardiovascular: ( - ) palpitation, ( - ) chest discomfort, ( - ) lower extremity swelling Gastrointestinal:  ( - ) nausea, ( - ) heartburn, ( - ) change in bowel habits Skin: ( - ) abnormal skin rashes Lymphatics: ( - ) new lymphadenopathy, ( - ) easy bruising Neurological: ( - ) numbness, ( - ) tingling, ( - ) new weaknesses Behavioral/Psych: ( - ) mood change, ( - ) new changes  All other systems were reviewed with the patient and are negative.  PHYSICAL EXAMINATION: ECOG PERFORMANCE STATUS: 0 - Asymptomatic  Vitals:   10/21/20 1023  BP: (!) 147/75  Pulse: 78  Resp: 18  Temp: 97.9 F (36.6 C)  SpO2: 97%   Filed Weights   10/21/20 1023  Weight: 257 lb 3.2 oz (116.7 kg)    GENERAL: well appearing middle aged Caucasian male in NAD  SKIN: skin color, texture, turgor are normal, no rashes or significant. Lesions. Mild rash on arms bilaterally, consistent with contact dermatitis.  EYES: conjunctiva are pink and non-injected, sclera clear LUNGS: clear to auscultation and percussion with normal breathing effort HEART: regular rate & rhythm and no murmurs and no lower extremity edema Musculoskeletal:  no cyanosis of digits and no clubbing  PSYCH: alert & oriented x 3, fluent speech NEURO: no focal motor/sensory deficits  LABORATORY DATA:  I have reviewed the data as listed CBC Latest Ref Rng & Units 10/19/2020 08/12/2020 07/19/2020  WBC 4.0 - 10.5 K/uL 8.2 10.5 8.1  Hemoglobin 13.0 - 17.0 g/dL 13.5 13.2 13.8  Hematocrit 39 - 52 % 41.1 40.0 41.9  Platelets 150 - 400 K/uL 343 311 271    CMP Latest Ref Rng & Units 10/19/2020 07/19/2020 05/06/2020  Glucose 70 - 99 mg/dL 93 103(H) 92  BUN 8 - 23 mg/dL 13 15 14   Creatinine 0.61 - 1.24 mg/dL 0.99 1.17 0.91  Sodium 135 - 145 mmol/L 142 142 142  Potassium 3.5 - 5.1 mmol/L 4.0 3.8 4.0  Chloride 98 - 111 mmol/L 108 108 108  CO2 22 -  32 mmol/L 28 26 26   Calcium 8.9 - 10.3 mg/dL 9.2 10.1 9.4  Total Protein 6.5 - 8.1 g/dL 6.9 7.0 6.7  Total Bilirubin 0.3 - 1.2 mg/dL 0.3 <0.2(L) 0.2(L)  Alkaline Phos 38 - 126 U/L 101 112 88  AST 15 - 41 U/L 15 12(L) 17  ALT 0 - 44 U/L 12 18 26      RADIOGRAPHIC STUDIES: I have personally reviewed the radiological images as listed and agreed with the findings in the report: previously viewed PET CT which showed marked improvement in right axillary FDG avidity. Some residual activity in the neck/AP window.   CT Soft Tissue Neck W Contrast  Result Date: 10/19/2020 CLINICAL DATA:  Hodgkin's lymphoma status post chemo radiation EXAM: CT NECK WITH CONTRAST TECHNIQUE: Multidetector CT imaging of the neck was performed using the standard protocol following the bolus administration of intravenous contrast. CONTRAST:  193mL OMNIPAQUE IOHEXOL 300 MG/ML  SOLN COMPARISON:  None. FINDINGS: Pharynx and larynx: Streak artifact from shoulder arthroplasty partially obscures the larynx. There is mild laryngeal soft tissue thickening that is likely a sequela of radiation. Otherwise, normal pharyngeal structures. Salivary glands: No inflammation, mass, or stone. Thyroid: Mildly enlarged thyroid gland with multiple partially calcified  nodules. Lymph nodes: None enlarged or abnormal density. Vascular: Negative. Limited intracranial: Negative. Visualized orbits: Negative. Mastoids and visualized paranasal sinuses: Clear. Skeleton: No acute or aggressive process. Upper chest: Negative. Other: None. IMPRESSION: 1. No cervical lymphadenopathy. 2. Laryngeal soft tissue thickening may be a sequela of radiation. 3. Mildly enlarged thyroid gland with multiple partially calcified nodules. Recommend thyroid ultrasound (ref: J Am Coll Radiol. 2015 Feb;12(2): 143-50). Electronically Signed   By: Ulyses Jarred M.D.   On: 10/19/2020 22:25   CT CHEST ABDOMEN PELVIS W CONTRAST  Result Date: 10/20/2020 CLINICAL DATA:  Hodgkin's lymphoma, assess treatment response EXAM: CT CHEST, ABDOMEN, AND PELVIS WITH CONTRAST TECHNIQUE: Multidetector CT imaging of the chest, abdomen and pelvis was performed following the standard protocol during bolus administration of intravenous contrast. CONTRAST:  129mL OMNIPAQUE IOHEXOL 300 MG/ML SOLN, additional oral enteric contrast COMPARISON:  PET-CT, 03/08/2020, 12/31/2019 FINDINGS: CT CHEST FINDINGS Cardiovascular: Scattered aortic atherosclerosis. Normal heart size. Scattered coronary artery calcifications. No pericardial effusion. Mediastinum/Nodes: No enlarged mediastinal, hilar, or axillary lymph nodes. Redemonstrated heterogeneous thyroid with nodules and calcifications bilaterally. Trachea, and esophagus demonstrate no significant findings. Lungs/Pleura: Mild centrilobular and paraseptal emphysema. No pleural effusion or pneumothorax. Musculoskeletal: No chest wall mass or suspicious bone lesions identified. CT ABDOMEN PELVIS FINDINGS Hepatobiliary: No solid liver abnormality is seen. No gallstones, gallbladder wall thickening, or biliary dilatation. Pancreas: Unremarkable. No pancreatic ductal dilatation or surrounding inflammatory changes. Spleen: Normal in size without significant abnormality. Adrenals/Urinary Tract:  Redemonstrated definitively benign fat containing adenoma of the lateral limb of the left adrenal gland. Kidneys are normal, without renal calculi, solid lesion, or hydronephrosis. Bladder is unremarkable. Stomach/Bowel: Stomach is within normal limits. Appendix appears normal. No evidence of bowel wall thickening, distention, or inflammatory changes. Vascular/Lymphatic: Aortic atherosclerosis. Unchanged subcentimeter retroperitoneal and portacaval lymph nodes, not previously PET avid (series 2, image 82, 67). Reproductive: No mass or other abnormality. Other: No abdominal wall hernia or abnormality. No abdominopelvic ascites. Musculoskeletal: No acute or significant osseous findings. IMPRESSION: 1. No evidence of recurrent lymphadenopathy in the chest, abdomen, or pelvis. Unchanged subcentimeter retroperitoneal and portacaval lymph nodes, not previously PET avid. Right axillary lymphadenopathy remains completely resolved. 2.  Emphysema. 3. Redemonstrated heterogeneous thyroid with multiple nodules and calcifications. Recommend dedicated thyroid ultrasound when  clinically appropriate. Recommend thyroid US (ref: J Am Coll Radiol. 2015 Feb;12(2): 143-50). Electronically Signed   By: Eddie Candle M.D.   On: 10/20/2020 11:53    ASSESSMENT & PLAN Marc Watson 64 y.o. male with medical history significant for classical Hodgkin Lymphoma in remission who presents for a follow up visit.  Overall Mr. Barnhart has been well in the interim since our last visit.  His energy has been good and he has been intentionally losing weight.  Otherwise he has no questions or concerns at this time.  His most recent CT scan showed no evidence of residual recurrent disease.  The plan was for AVD chemotherapy x 4 total cycles followed by ISRT. After all treatments would reassess with PET CT scan (recommended within 3 months of completion of therapy). The chemotherapy regimen consisted of doxorubicin 25mg /m2, vinblastine 6mg /m2,  and dacarbazine 375mg /m2 on Day 1 and Day 15. Bleomycin will be held on concern for his lung function and for his age. He started Cycle 1 Day 1 on 01/16/2020.   IPS Score For Hodgkin Lymphoma: 2 (81% OS, 67% freedom from progression)  # Classical Hodgkin's Lymphoma, Stage II (Nonbulky, Favorable) in Remission --completed AVD chemotherapy (with no bleomycin) and radiation therapy.   --restaging post Cycle 2 PET scan performed 03/08/2020, showed significant improvement, with complete resolution of the right axillary adenopathy (the larger previous dominant lymph nodes are currently no longer present compatible with Deauville 1 disease); reduced activity and size of lymph nodes in the neck, currently Deauville 2; and Deauville 2 activity in the AP window lymph node.Most recent CT performed approximately 12 weeks after completion of radiation therapy (Nov 2021), no evidence of residual disease or lymph adenopathy.  --patient has undergone appropriate pre-treatment testing including TTE, port placement, and blood work. PFTs were not able to be performed due to issues with scheduling during the [place] pandemic. Bleomycin held due to age and concern for underlying lung disease --stable labs, rechecked today.  --appreciate assessment by radiation oncology for post chemotherapy RT to the affected lymph nodes. Therapy now completed.  --Per NCCN guidelines, will have clinic f/u q 3-6 months for 1-2 years followed by every 6-12 months until year 3, then annually. CT scan no more often than q 6 months for the first 2 years following therapy --plan to RTC in 3 months with repeat CT scan in 6 months  #Thyroid Nodule, stable --found incidentally on last PET CT scan on 12/31/2019 --[place] recommended based on last CT scan (10/19/2020)  --continue to monitor    Orders Placed This Encounter  Procedures  . US Soft Tissue Head/Neck    Standing Status:   Future    Standing Expiration Date:   10/21/2021    Order Specific  Question:   Reason for Exam (SYMPTOM  OR DIAGNOSIS REQUIRED)    Answer:   Thyroid nodule noted on CT scan, [place] recommended.    Order Specific Question:   Preferred imaging location?    Answer:   [facility]   All questions were answered. The patient knows to call the clinic with any problems, questions or concerns.  A total of more than 30 minutes were spent on this encounter and over half of that time was spent on counseling and coordination of care as outlined above.   [name], MD Department of Hematology/Oncology [place] [facility] Phone: [phone] Pager: [phone] Email: [email or web address]  10/21/2020 11:13 AM

## 2020-10-22 ENCOUNTER — Telehealth: Payer: Self-pay | Admitting: Hematology and Oncology

## 2020-10-22 NOTE — Telephone Encounter (Signed)
Scheduled per los. Called and spoke with patient. Confirmed appt 

## 2020-10-28 ENCOUNTER — Other Ambulatory Visit: Payer: Self-pay

## 2020-10-28 ENCOUNTER — Ambulatory Visit (HOSPITAL_COMMUNITY)
Admission: RE | Admit: 2020-10-28 | Discharge: 2020-10-28 | Disposition: A | Discharge status: Home / Self Care | Payer: BC Managed Care – PPO | Source: Ambulatory Visit | Attending: Hematology and Oncology | Admitting: Hematology and Oncology

## 2020-10-28 ENCOUNTER — Other Ambulatory Visit: Payer: Self-pay | Admitting: Hematology and Oncology

## 2020-10-28 DIAGNOSIS — E041 Nontoxic single thyroid nodule: Secondary | ICD-10-CM | POA: Diagnosis not present

## 2020-10-28 DIAGNOSIS — E042 Nontoxic multinodular goiter: Secondary | ICD-10-CM | POA: Diagnosis not present

## 2021-01-17 ENCOUNTER — Telehealth: Payer: Self-pay | Admitting: *Deleted

## 2021-01-17 NOTE — Telephone Encounter (Signed)
Received call from patient to check when his next appt is here.  Advised that he is on the schedule for 01/21/21. Provided time as well. Pt voiced understanding

## 2021-01-21 ENCOUNTER — Inpatient Hospital Stay: Payer: BC Managed Care – PPO

## 2021-01-21 ENCOUNTER — Other Ambulatory Visit: Payer: Self-pay | Admitting: Hematology and Oncology

## 2021-01-21 ENCOUNTER — Other Ambulatory Visit: Payer: Self-pay

## 2021-01-21 ENCOUNTER — Inpatient Hospital Stay: Payer: BC Managed Care – PPO | Attending: Hematology and Oncology | Admitting: Hematology and Oncology

## 2021-01-21 VITALS — BP 100/74 | HR 77 | Temp 97.8°F | Resp 18 | Ht 73.0 in | Wt 253.6 lb

## 2021-01-21 DIAGNOSIS — Z7952 Long term (current) use of systemic steroids: Secondary | ICD-10-CM | POA: Diagnosis not present

## 2021-01-21 DIAGNOSIS — Z8571 Personal history of Hodgkin lymphoma: Secondary | ICD-10-CM | POA: Diagnosis not present

## 2021-01-21 DIAGNOSIS — I10 Essential (primary) hypertension: Secondary | ICD-10-CM | POA: Diagnosis not present

## 2021-01-21 DIAGNOSIS — J449 Chronic obstructive pulmonary disease, unspecified: Secondary | ICD-10-CM | POA: Diagnosis not present

## 2021-01-21 DIAGNOSIS — Z791 Long term (current) use of non-steroidal anti-inflammatories (NSAID): Secondary | ICD-10-CM | POA: Diagnosis not present

## 2021-01-21 DIAGNOSIS — C8170 Other classical Hodgkin lymphoma, unspecified site: Secondary | ICD-10-CM

## 2021-01-21 DIAGNOSIS — K219 Gastro-esophageal reflux disease without esophagitis: Secondary | ICD-10-CM | POA: Diagnosis not present

## 2021-01-21 DIAGNOSIS — Z96611 Presence of right artificial shoulder joint: Secondary | ICD-10-CM | POA: Diagnosis not present

## 2021-01-21 DIAGNOSIS — Z79899 Other long term (current) drug therapy: Secondary | ICD-10-CM | POA: Diagnosis not present

## 2021-01-21 LAB — CBC WITH DIFFERENTIAL (CANCER CENTER ONLY)
Abs Immature Granulocytes: 0.01 10*3/uL (ref 0.00–0.07)
Basophils Absolute: 0.1 10*3/uL (ref 0.0–0.1)
Basophils Relative: 1 %
Eosinophils Absolute: 0.3 10*3/uL (ref 0.0–0.5)
Eosinophils Relative: 3 %
HCT: 44.2 % (ref 39.0–52.0)
Hemoglobin: 14.3 g/dL (ref 13.0–17.0)
Immature Granulocytes: 0 %
Lymphocytes Relative: 26 %
Lymphs Abs: 2.2 10*3/uL (ref 0.7–4.0)
MCH: 28.4 pg (ref 26.0–34.0)
MCHC: 32.4 g/dL (ref 30.0–36.0)
MCV: 87.9 fL (ref 80.0–100.0)
Monocytes Absolute: 1.1 10*3/uL — ABNORMAL HIGH (ref 0.1–1.0)
Monocytes Relative: 13 %
Neutro Abs: 4.7 10*3/uL (ref 1.7–7.7)
Neutrophils Relative %: 57 %
Platelet Count: 327 10*3/uL (ref 150–400)
RBC: 5.03 MIL/uL (ref 4.22–5.81)
RDW: 14.7 % (ref 11.5–15.5)
WBC Count: 8.4 10*3/uL (ref 4.0–10.5)
nRBC: 0 % (ref 0.0–0.2)

## 2021-01-21 LAB — CMP (CANCER CENTER ONLY)
ALT: 17 U/L (ref 0–44)
AST: 15 U/L (ref 15–41)
Albumin: 3.9 g/dL (ref 3.5–5.0)
Alkaline Phosphatase: 122 U/L (ref 38–126)
Anion gap: 5 (ref 5–15)
BUN: 20 mg/dL (ref 8–23)
CO2: 25 mmol/L (ref 22–32)
Calcium: 9.1 mg/dL (ref 8.9–10.3)
Chloride: 110 mmol/L (ref 98–111)
Creatinine: 0.98 mg/dL (ref 0.61–1.24)
GFR, Estimated: >60 mL/min (ref >60)
Glucose, Bld: 102 mg/dL — ABNORMAL HIGH (ref 70–99)
Potassium: 4.3 mmol/L (ref 3.5–5.1)
Sodium: 140 mmol/L (ref 135–145)
Total Bilirubin: 0.3 mg/dL (ref 0.3–1.2)
Total Protein: 7 g/dL (ref 6.5–8.1)

## 2021-01-21 LAB — LACTATE DEHYDROGENASE: LDH: 136 U/L (ref 98–192)

## 2021-01-21 NOTE — Progress Notes (Signed)
[place] [phone]   [phone]  PROGRESS NOTE  Patient Care Team: [name], MD as PCP - General (Internal Medicine)  Hematological/Oncological History # Classical Hodgkin's Lymphoma, Stage II in Remission 1)10/10/2019: CT scan for right shoulder pain showed increased right axillary lymphadenopathy.  2) 12/10/2019: patient underwent US of the right axilla, noted to have right axillary lymphadenopathy 3) 12/15/2019: [place] axillary lymph node biopsy. Pathology confirms Classical Hodgkin's lymphoma 4) 12/24/2019: Establish care with Dr. Lorenso Courier 5)  12/31/2019: PET CT scan showed signs of right axillary adenopathy with increased metabolic activity 6) 01/18/33: Cycle 1 Day 1 of AVD chemotherapy (Bleomycin to be held for the duration of treatment) 7) 01/29/2020: Cycle 1 Day 14 of AVD chemotherapy  8) 02/12/2020: Cycle 2 Day 1 of AVD chemotherapy  9) 02/26/2020: Cycle 2 Day 15 of AVD chemotherapy  10) 03/11/2020: Cycle 3 Day 1 of AVD chemotherapy  11) 03/25/2020: Cycle 3 Day 15 of AVD chemotherapy  12) 04/08/2020: Cycle 4 Day 1 of AVD chemotherapy  13) 04/22/2020: Cycle 4 Day 15 of AVD chemotherapy. Completion of Chemotherapy regimen 14) 06/16/2020: start of radiation therapy.  15) 07/08/2020: final radiation treatment.  16) 10/19/2020: Post treatment CT shows no evidence of residual or recurrent disease.  Interval History:  Marc Watson 65 y.o. male with medical history significant for classical Hodgkin Lymphoma who presents for a follow up visit. The patient's last visit was on 10/21/2020. Today the patient presents for his routine surveillance f/u.   Exam today Mr. Fix notes been well in the interim since our last visit.  He is currently at 253 pounds and notes that he is trying to lose weight and get down from the maximum he had during chemotherapy at 268 pounds.  He reports that he is trying not to eat any "stupid stuff" as it causes him to gain weight.  He  does have an affinity for peanut M&Ms which he has been cutting down on.Marland Kitchen  He feels like his target goal for weight would be 200 to 225 pounds.  He notes otherwise that his energy has been good and he has not had any new or concerning symptoms in the interim since our last visit.  He denies having issues with fevers, chills, sweats, nausea vomiting or diarrhea.  A full 10 point ROS is listed below.  MEDICAL HISTORY:  Past Medical History:  Diagnosis Date  . Arthritis   . COPD (chronic obstructive pulmonary disease) ([place])    per CXR on 11/11/2019  . GERD (gastroesophageal reflux disease)    occ  . Hypertension    recently started taking on 11/05/2019   ALLERGIES:  is allergic to no known allergies.  MEDICATIONS:  Current Outpatient Medications  Medication Sig Dispense Refill  . cyclobenzaprine (FLEXERIL) 10 MG tablet Take 10 mg by mouth at bedtime as needed for muscle spasms.     . diphenhydrAMINE (BENADRYL) 25 mg capsule Take 25 mg by mouth every 6 (six) hours as needed.    . lidocaine-prilocaine (EMLA) cream Apply 1 application topically as needed. 30 g 1  . losartan (COZAAR) 50 MG tablet Take 50 mg by mouth daily.    . naproxen (NAPROSYN) 250 MG tablet Take by mouth as needed for moderate pain. As needed     . nicotine (NICODERM CQ - DOSED IN MG/24 HOURS) 14 mg/24hr patch Apply 14mg  patch daily x 6 wk, then 7 mg patch daily x 2 wk 14 patch 2  . nicotine (  NICODERM CQ - DOSED IN MG/24 HR) 7 mg/24hr patch Apply 14mg  patch daily x 6 wk, then 7 mg patch daily x 2 wk 14 patch 0  . Omega-3 Fatty Acids (FISH OIL PO) Take 2 capsules by mouth daily. OMEGA XL PROPRIETARY BLEND 300 MG d-ALPHA TOCOPHEROL (VITAMIN E)/EXTRA VIRGIN OLIVE OIL/EXTRACT (pcso-524) CONTAINING OMEGA FATTY ACIDS, GREEN LIPPED MUSSEL (PERNA CANLICULUS) OIL)     . ondansetron (ZOFRAN) 8 MG tablet Take 1 tablet (8 mg total) by mouth every 8 (eight) hours as needed for nausea or vomiting. (Patient not taking: Reported on 03/29/2020)  30 tablet 1  . prochlorperazine (COMPAZINE) 10 MG tablet Take 1 tablet (10 mg total) by mouth every 6 (six) hours as needed for nausea or vomiting. (Patient not taking: Reported on 03/29/2020) 30 tablet 1  . PROTONIX 40 MG tablet EVERY DAY 90 tablet 3   No current facility-administered medications for this visit.   Facility-Administered Medications Ordered in Other Visits  Medication Dose Route Frequency Provider Last Rate Last Admin  . 0.9 %  sodium chloride infusion   Intravenous Continuous Allred, Darrell K, PA-C        REVIEW OF SYSTEMS:   Constitutional: ( - ) fevers, ( - )  chills , ( - ) night sweats (+) itching Eyes: ( - ) blurriness of vision, ( - ) double vision, ( - ) watery eyes Ears, nose, mouth, throat, and face: ( - ) mucositis, ( - ) sore throat Respiratory: ( - ) cough, ( - ) dyspnea, ( - ) wheezes Cardiovascular: ( - ) palpitation, ( - ) chest discomfort, ( - ) lower extremity swelling Gastrointestinal:  ( - ) nausea, ( - ) heartburn, ( - ) change in bowel habits Skin: ( - ) abnormal skin rashes Lymphatics: ( - ) new lymphadenopathy, ( - ) easy bruising Neurological: ( - ) numbness, ( - ) tingling, ( - ) new weaknesses Behavioral/Psych: ( - ) mood change, ( - ) new changes  All other systems were reviewed with the patient and are negative.  PHYSICAL EXAMINATION: ECOG PERFORMANCE STATUS: 0 - Asymptomatic  Vitals:   01/21/21 0946  BP: 100/74  Pulse: 77  Resp: 18  Temp: 97.8 F (36.6 C)  SpO2: 96%   Filed Weights   01/21/21 0946  Weight: 253 lb 9.6 oz (115 kg)    GENERAL: well appearing middle aged Caucasian male in NAD  SKIN: skin color, texture, turgor are normal, no rashes or significant. Lesions. Mild rash on arms bilaterally, consistent with contact dermatitis.  EYES: conjunctiva are pink and non-injected, sclera clear LUNGS: clear to auscultation and percussion with normal breathing effort HEART: regular rate & rhythm and no murmurs and no lower  extremity edema Musculoskeletal: no cyanosis of digits and no clubbing  PSYCH: alert & oriented x 3, fluent speech NEURO: no focal motor/sensory deficits  LABORATORY DATA:  I have reviewed the data as listed CBC Latest Ref Rng & Units 01/21/2021 10/19/2020 08/12/2020  WBC 4.0 - 10.5 K/uL 8.4 8.2 10.5  Hemoglobin 13.0 - 17.0 g/dL 14.3 13.5 13.2  Hematocrit 39.0 - 52.0 % 44.2 41.1 40.0  Platelets 150 - 400 K/uL 327 343 311    CMP Latest Ref Rng & Units 01/21/2021 10/19/2020 07/19/2020  Glucose 70 - 99 mg/dL 102(H) 93 103(H)  BUN 8 - 23 mg/dL 20 13 15   Creatinine 0.61 - 1.24 mg/dL 0.98 0.99 1.17  Sodium 135 - 145 mmol/L 140 142 142  Potassium 3.5 -  5.1 mmol/L 4.3 4.0 3.8  Chloride 98 - 111 mmol/L 110 108 108  CO2 22 - 32 mmol/L 25 28 26   Calcium 8.9 - 10.3 mg/dL 9.1 9.2 10.1  Total Protein 6.5 - 8.1 g/dL 7.0 6.9 7.0  Total Bilirubin 0.3 - 1.2 mg/dL 0.3 0.3 <0.2(L)  Alkaline Phos 38 - 126 U/L 122 101 112  AST 15 - 41 U/L 15 15 12(L)  ALT 0 - 44 U/L 17 12 18      RADIOGRAPHIC STUDIES: I have personally reviewed the radiological images as listed and agreed with the findings in the report: previously viewed PET CT which showed marked improvement in right axillary FDG avidity. Some residual activity in the neck/AP window.   No results found.  ASSESSMENT & PLAN MCCARTNEY CHUBA 65 y.o. male with medical history significant for classical Hodgkin Lymphoma in remission who presents for a follow up visit.  Overall Mr. Stgermaine has been well in the interim since our last visit.  His energy has been good and he has been intentionally losing weight. Otherwise he has no questions or concerns at this time.  His most recent CT scan showed no evidence of residual recurrent disease.  The plan was for AVD chemotherapy x 4 total cycles followed by ISRT. After all treatments would reassess with PET CT scan (recommended within 3 months of completion of therapy). The chemotherapy regimen consisted of  doxorubicin 25mg /m2, vinblastine 6mg /m2, and dacarbazine 375mg /m2 on Day 1 and Day 15. Bleomycin will be held on concern for his lung function and for his age. He started Cycle 1 Day 1 on 01/16/2020.  Follow-up PET CT scan on 03/08/2020 showed complete response to therapy.  As such she entered surveillance and is receiving CT scans every 6 months for the first 2 years and then yearly after that.  IPS Score For Hodgkin Lymphoma: 2 (81% OS, 67% freedom from progression)  # Classical Hodgkin's Lymphoma, Stage II (Nonbulky, Favorable) in Remission --completed AVD chemotherapy (with no bleomycin) and radiation therapy.   --restaging post Cycle 2 PET scan performed 03/08/2020, showed significant improvement, with complete resolution of the right axillary adenopathy (the larger previous dominant lymph nodes are currently no longer present compatible with Deauville 1 disease); reduced activity and size of lymph nodes in the neck, currently Deauville 2; and Deauville 2 activity in the AP window lymph node.Most recent CT performed approximately 12 weeks after completion of radiation therapy (Nov 2021), no evidence of residual disease or lymph adenopathy.  --patient has undergone appropriate pre-treatment testing including TTE, port placement, and blood work. PFTs were not able to be performed due to issues with scheduling during the [place] pandemic. Bleomycin held due to age and concern for underlying lung disease --stable labs, rechecked today.  --appreciate assessment by radiation oncology for post chemotherapy RT to the affected lymph nodes. Therapy now completed.  --Per NCCN guidelines, will have clinic f/u q 3-6 months for 1-2 years followed by every 6-12 months until year 3, then annually. CT scan no more often than q 6 months for the first 2 years following therapy --plan to RTC in 3 months with repeat CT scan   #Thyroid Nodule, stable --found incidentally on last PET CT scan on 12/31/2019 --US performed,  appears stable based on prior imaging.  --discussed with patient, he declined biopsy at this time. This a reasonable approach given its stability over serial imaging.  --continue to monitor    Orders Placed This Encounter  Procedures  . CT CHEST  ABDOMEN PELVIS W CONTRAST    Standing Status:   Future    Standing Expiration Date:   01/21/2022    Order Specific Question:   If indicated for the ordered procedure, I authorize the administration of contrast media per Radiology protocol    Answer:   Yes    Order Specific Question:   Preferred imaging location?    Answer:   [facility]    Order Specific Question:   Is Oral Contrast requested for this exam?    Answer:   Yes, Per Radiology protocol    Order Specific Question:   Reason for Exam (SYMPTOM  OR DIAGNOSIS REQUIRED)    Answer:   Hodgkin lymphoma in remission, assess for recurrence.   All questions were answered. The patient knows to call the clinic with any problems, questions or concerns.  A total of more than 30 minutes were spent on this encounter and over half of that time was spent on counseling and coordination of care as outlined above.   [name], MD Department of Hematology/Oncology [place] [facility] Phone: [phone] Pager: [phone] Email: [email or web address]  01/25/2021 7:51 PM

## 2021-01-24 ENCOUNTER — Telehealth: Payer: Self-pay | Admitting: Hematology and Oncology

## 2021-01-24 NOTE — Telephone Encounter (Signed)
Scheduled appts per 2/11 los. Pt confirmed appt date and time.  

## 2021-01-25 ENCOUNTER — Encounter: Payer: Self-pay | Admitting: Hematology and Oncology

## 2021-04-18 ENCOUNTER — Telehealth: Payer: Self-pay | Admitting: *Deleted

## 2021-04-18 ENCOUNTER — Telehealth: Payer: Self-pay | Admitting: Hematology and Oncology

## 2021-04-18 ENCOUNTER — Other Ambulatory Visit: Payer: Self-pay | Admitting: *Deleted

## 2021-04-18 DIAGNOSIS — C8170 Other classical Hodgkin lymphoma, unspecified site: Secondary | ICD-10-CM

## 2021-04-18 NOTE — Telephone Encounter (Signed)
R/s appts per 5/9 sch msg. Pt aware.  

## 2021-04-18 NOTE — Telephone Encounter (Signed)
Patient called to ask if he could move out his surveillance CT to after July 1st when he switches to [facility] since this will  Cost him $3,579 if he has it done now vs after July $300 out of pocket.  Per Dr Lorenso Courier patient is ok to push scan out.  He will call and reschedule.

## 2021-04-20 ENCOUNTER — Ambulatory Visit (HOSPITAL_COMMUNITY): Payer: BC Managed Care – PPO

## 2021-04-22 ENCOUNTER — Ambulatory Visit: Payer: BC Managed Care – PPO | Admitting: Hematology and Oncology

## 2021-04-22 ENCOUNTER — Other Ambulatory Visit: Payer: BC Managed Care – PPO

## 2021-06-10 ENCOUNTER — Inpatient Hospital Stay: Payer: Medicare Other | Attending: Hematology and Oncology

## 2021-06-10 ENCOUNTER — Other Ambulatory Visit: Payer: Self-pay

## 2021-06-10 ENCOUNTER — Encounter: Payer: Self-pay | Admitting: Hematology and Oncology

## 2021-06-10 DIAGNOSIS — I10 Essential (primary) hypertension: Secondary | ICD-10-CM | POA: Diagnosis not present

## 2021-06-10 DIAGNOSIS — Z79899 Other long term (current) drug therapy: Secondary | ICD-10-CM | POA: Insufficient documentation

## 2021-06-10 DIAGNOSIS — K219 Gastro-esophageal reflux disease without esophagitis: Secondary | ICD-10-CM | POA: Insufficient documentation

## 2021-06-10 DIAGNOSIS — J449 Chronic obstructive pulmonary disease, unspecified: Secondary | ICD-10-CM | POA: Diagnosis not present

## 2021-06-10 DIAGNOSIS — Z8571 Personal history of Hodgkin lymphoma: Secondary | ICD-10-CM | POA: Diagnosis not present

## 2021-06-10 DIAGNOSIS — E041 Nontoxic single thyroid nodule: Secondary | ICD-10-CM | POA: Diagnosis not present

## 2021-06-10 DIAGNOSIS — Z96611 Presence of right artificial shoulder joint: Secondary | ICD-10-CM | POA: Diagnosis not present

## 2021-06-10 DIAGNOSIS — Z791 Long term (current) use of non-steroidal anti-inflammatories (NSAID): Secondary | ICD-10-CM | POA: Insufficient documentation

## 2021-06-10 DIAGNOSIS — C8170 Other classical Hodgkin lymphoma, unspecified site: Secondary | ICD-10-CM

## 2021-06-10 DIAGNOSIS — Z7952 Long term (current) use of systemic steroids: Secondary | ICD-10-CM | POA: Insufficient documentation

## 2021-06-10 LAB — CMP (CANCER CENTER ONLY)
ALT: 16 U/L (ref 0–44)
AST: 18 U/L (ref 15–41)
Albumin: 4 g/dL (ref 3.5–5.0)
Alkaline Phosphatase: 95 U/L (ref 38–126)
Anion gap: 7 (ref 5–15)
BUN: 18 mg/dL (ref 8–23)
CO2: 25 mmol/L (ref 22–32)
Calcium: 9.2 mg/dL (ref 8.9–10.3)
Chloride: 109 mmol/L (ref 98–111)
Creatinine: 1.03 mg/dL (ref 0.61–1.24)
GFR, Estimated: >60 mL/min (ref >60)
Glucose, Bld: 107 mg/dL — ABNORMAL HIGH (ref 70–99)
Potassium: 4.1 mmol/L (ref 3.5–5.1)
Sodium: 141 mmol/L (ref 135–145)
Total Bilirubin: 0.4 mg/dL (ref 0.3–1.2)
Total Protein: 7.2 g/dL (ref 6.5–8.1)

## 2021-06-10 LAB — CBC WITH DIFFERENTIAL (CANCER CENTER ONLY)
Abs Immature Granulocytes: 0.03 10*3/uL (ref 0.00–0.07)
Basophils Absolute: 0.1 10*3/uL (ref 0.0–0.1)
Basophils Relative: 1 %
Eosinophils Absolute: 0.2 10*3/uL (ref 0.0–0.5)
Eosinophils Relative: 2 %
HCT: 44.1 % (ref 39.0–52.0)
Hemoglobin: 14.6 g/dL (ref 13.0–17.0)
Immature Granulocytes: 0 %
Lymphocytes Relative: 19 %
Lymphs Abs: 1.8 10*3/uL (ref 0.7–4.0)
MCH: 29.3 pg (ref 26.0–34.0)
MCHC: 33.1 g/dL (ref 30.0–36.0)
MCV: 88.6 fL (ref 80.0–100.0)
Monocytes Absolute: 0.8 10*3/uL (ref 0.1–1.0)
Monocytes Relative: 8 %
Neutro Abs: 6.2 10*3/uL (ref 1.7–7.7)
Neutrophils Relative %: 70 %
Platelet Count: 299 10*3/uL (ref 150–400)
RBC: 4.98 MIL/uL (ref 4.22–5.81)
RDW: 14.6 % (ref 11.5–15.5)
WBC Count: 9 10*3/uL (ref 4.0–10.5)
nRBC: 0 % (ref 0.0–0.2)

## 2021-06-14 ENCOUNTER — Encounter (HOSPITAL_COMMUNITY): Payer: Self-pay | Admitting: Radiology

## 2021-06-14 ENCOUNTER — Encounter (HOSPITAL_COMMUNITY): Payer: Self-pay

## 2021-06-14 ENCOUNTER — Other Ambulatory Visit: Payer: Self-pay

## 2021-06-14 ENCOUNTER — Ambulatory Visit (HOSPITAL_COMMUNITY)
Admission: RE | Admit: 2021-06-14 | Discharge: 2021-06-14 | Disposition: A | Discharge status: Home / Self Care | Payer: Medicare Other | Source: Ambulatory Visit | Attending: Hematology and Oncology | Admitting: Hematology and Oncology

## 2021-06-14 DIAGNOSIS — N281 Cyst of kidney, acquired: Secondary | ICD-10-CM | POA: Diagnosis not present

## 2021-06-14 DIAGNOSIS — C801 Malignant (primary) neoplasm, unspecified: Secondary | ICD-10-CM | POA: Insufficient documentation

## 2021-06-14 DIAGNOSIS — C8190 Hodgkin lymphoma, unspecified, unspecified site: Secondary | ICD-10-CM | POA: Diagnosis not present

## 2021-06-14 DIAGNOSIS — C8170 Other classical Hodgkin lymphoma, unspecified site: Secondary | ICD-10-CM | POA: Diagnosis not present

## 2021-06-14 DIAGNOSIS — K5750 Diverticulosis of both small and large intestine without perforation or abscess without bleeding: Secondary | ICD-10-CM | POA: Diagnosis not present

## 2021-06-14 DIAGNOSIS — D3502 Benign neoplasm of left adrenal gland: Secondary | ICD-10-CM | POA: Diagnosis not present

## 2021-06-14 MED ORDER — IOHEXOL 300 MG/ML  SOLN
100.0000 mL | Freq: Once | INTRAMUSCULAR | Status: AC | PRN
  Administered 2021-06-14: 100 mL via INTRAVENOUS

## 2021-06-14 MED ORDER — SODIUM CHLORIDE (PF) 0.9 % IJ SOLN
INTRAMUSCULAR | Status: AC
  Filled 2021-06-14: qty 50

## 2021-06-16 ENCOUNTER — Inpatient Hospital Stay: Payer: Medicare Other | Admitting: Hematology and Oncology

## 2021-06-16 ENCOUNTER — Other Ambulatory Visit: Payer: Self-pay | Admitting: Hematology and Oncology

## 2021-06-16 ENCOUNTER — Other Ambulatory Visit: Payer: Self-pay

## 2021-06-16 VITALS — BP 135/85 | HR 80 | Temp 98.2°F | Resp 18 | Ht 73.0 in | Wt 249.0 lb

## 2021-06-16 DIAGNOSIS — E041 Nontoxic single thyroid nodule: Secondary | ICD-10-CM | POA: Diagnosis not present

## 2021-06-16 DIAGNOSIS — C8170 Other classical Hodgkin lymphoma, unspecified site: Secondary | ICD-10-CM

## 2021-06-16 DIAGNOSIS — F17200 Nicotine dependence, unspecified, uncomplicated: Secondary | ICD-10-CM

## 2021-06-16 DIAGNOSIS — Z7952 Long term (current) use of systemic steroids: Secondary | ICD-10-CM | POA: Diagnosis not present

## 2021-06-16 DIAGNOSIS — Z96611 Presence of right artificial shoulder joint: Secondary | ICD-10-CM | POA: Diagnosis not present

## 2021-06-16 DIAGNOSIS — Z791 Long term (current) use of non-steroidal anti-inflammatories (NSAID): Secondary | ICD-10-CM | POA: Diagnosis not present

## 2021-06-16 DIAGNOSIS — J449 Chronic obstructive pulmonary disease, unspecified: Secondary | ICD-10-CM | POA: Diagnosis not present

## 2021-06-16 DIAGNOSIS — I10 Essential (primary) hypertension: Secondary | ICD-10-CM | POA: Diagnosis not present

## 2021-06-16 DIAGNOSIS — K219 Gastro-esophageal reflux disease without esophagitis: Secondary | ICD-10-CM | POA: Diagnosis not present

## 2021-06-16 DIAGNOSIS — Z79899 Other long term (current) drug therapy: Secondary | ICD-10-CM | POA: Diagnosis not present

## 2021-06-16 DIAGNOSIS — Z8571 Personal history of Hodgkin lymphoma: Secondary | ICD-10-CM | POA: Diagnosis not present

## 2021-06-16 NOTE — Progress Notes (Signed)
[place] [phone]   [phone]) [phone]  PROGRESS NOTE  Patient Care Team: [name], MD as PCP - General (Internal Medicine)  Hematological/Oncological History # Classical Hodgkin's Lymphoma, Stage II in Remission 1)10/10/2019: CT scan for right shoulder pain showed increased right axillary lymphadenopathy.  2) 12/10/2019: patient underwent US of the right axilla, noted to have right axillary lymphadenopathy 3) 12/15/2019: [place] axillary lymph node biopsy. Pathology confirms Classical Hodgkin's lymphoma 4) 12/24/2019: Establish care with Dr. Lorenso Courier 5)  12/31/2019: PET CT scan showed signs of right axillary adenopathy with increased metabolic activity 6) 04/16/176: Cycle 1 Day 1 of AVD chemotherapy (Bleomycin to be held for the duration of treatment) 7) 01/29/2020: Cycle 1 Day 14 of AVD chemotherapy  8) 02/12/2020: Cycle 2 Day 1 of AVD chemotherapy  9) 02/26/2020: Cycle 2 Day 15 of AVD chemotherapy  10) 03/11/2020: Cycle 3 Day 1 of AVD chemotherapy  11) 03/25/2020: Cycle 3 Day 15 of AVD chemotherapy  12) 04/08/2020: Cycle 4 Day 1 of AVD chemotherapy  13) 04/22/2020: Cycle 4 Day 15 of AVD chemotherapy. Completion of Chemotherapy regimen 14) 06/16/2020: start of radiation therapy.  15) 07/08/2020: final radiation treatment.  16) 10/19/2020: Post treatment CT shows no evidence of residual or recurrent disease. 17) 06/14/2021: Post treatment CT shows no evidence of residual or recurrent disease.  Interval History:  Marc Watson 65 y.o. male with medical history significant for classical Hodgkin Lymphoma who presents for a follow up visit. The patient's last visit was on 01/21/2021. Today the patient presents for his routine surveillance f/u.   On exam today Marc Watson notes he has been well in the interim since her last visit.  He recently just got back from the beach.  Reports that he has not been having issues since our last visit.  He reports that his weight has been  steady though he does have a goal of decreasing his weight down to approximately 220 pounds.  He reports that when he gets heavier it aggravates his knee.  His appetite has been good and he has no additional questions comments or concerns today.  He notes otherwise that his energy has been good and he has not had any new or concerning symptoms in the interim since our last visit.  He denies having issues with fevers, chills, sweats, nausea vomiting or diarrhea.  A full 10 point ROS is listed below.  MEDICAL HISTORY:  Past Medical History:  Diagnosis Date   Arthritis    COPD (chronic obstructive pulmonary disease) ([place])    per CXR on 11/11/2019   GERD (gastroesophageal reflux disease)    occ   hodgkins lymphoma 12/2019   Hypertension    recently started taking on 11/05/2019   ALLERGIES:  is allergic to no known allergies.  MEDICATIONS:  Current Outpatient Medications  Medication Sig Dispense Refill   cyclobenzaprine (FLEXERIL) 10 MG tablet Take 10 mg by mouth at bedtime as needed for muscle spasms.      diphenhydrAMINE (BENADRYL) 25 mg capsule Take 25 mg by mouth every 6 (six) hours as needed.     losartan (COZAAR) 50 MG tablet Take 50 mg by mouth daily.     nicotine (NICODERM CQ - DOSED IN MG/24 HOURS) 14 mg/24hr patch Apply 14mg  patch daily x 6 wk, then 7 mg patch daily x 2 wk 14 patch 2   nicotine (NICODERM CQ - DOSED IN MG/24 HR) 7 mg/24hr patch Apply 14mg  patch daily x 6 wk, then 7  mg patch daily x 2 wk 14 patch 0   Omega-3 Fatty Acids (FISH OIL PO) Take 2 capsules by mouth daily. OMEGA XL PROPRIETARY BLEND 300 MG d-ALPHA TOCOPHEROL (VITAMIN E)/EXTRA VIRGIN OLIVE OIL/EXTRACT (pcso-524) CONTAINING OMEGA FATTY ACIDS, GREEN LIPPED MUSSEL (PERNA CANLICULUS) OIL)      No current facility-administered medications for this visit.   Facility-Administered Medications Ordered in Other Visits  Medication Dose Route Frequency Provider Last Rate Last Admin   0.9 %  sodium chloride infusion    Intravenous Continuous Allred, Darrell K, PA-C        REVIEW OF SYSTEMS:   Constitutional: ( - ) fevers, ( - )  chills , ( - ) night sweats (+) itching Eyes: ( - ) blurriness of vision, ( - ) double vision, ( - ) watery eyes Ears, nose, mouth, throat, and face: ( - ) mucositis, ( - ) sore throat Respiratory: ( - ) cough, ( - ) dyspnea, ( - ) wheezes Cardiovascular: ( - ) palpitation, ( - ) chest discomfort, ( - ) lower extremity swelling Gastrointestinal:  ( - ) nausea, ( - ) heartburn, ( - ) change in bowel habits Skin: ( - ) abnormal skin rashes Lymphatics: ( - ) new lymphadenopathy, ( - ) easy bruising Neurological: ( - ) numbness, ( - ) tingling, ( - ) new weaknesses Behavioral/Psych: ( - ) mood change, ( - ) new changes  All other systems were reviewed with the patient and are negative.  PHYSICAL EXAMINATION: ECOG PERFORMANCE STATUS: 0 - Asymptomatic  Vitals:   06/16/21 1007  BP: 135/85  Pulse: 80  Resp: 18  Temp: 98.2 F (36.8 C)  SpO2: 98%   Filed Weights   06/16/21 1007  Weight: 249 lb (112.9 kg)    GENERAL: well appearing middle aged Caucasian male in NAD  SKIN: skin color, texture, turgor are normal, no rashes or significant. Lesions. Mild rash on arms bilaterally, consistent with contact dermatitis.  EYES: conjunctiva are pink and non-injected, sclera clear LUNGS: clear to auscultation and percussion with normal breathing effort HEART: regular rate & rhythm and no murmurs and no lower extremity edema Musculoskeletal: no cyanosis of digits and no clubbing  PSYCH: alert & oriented x 3, fluent speech NEURO: no focal motor/sensory deficits  LABORATORY DATA:  I have reviewed the data as listed CBC Latest Ref Rng & Units 06/10/2021 01/21/2021 10/19/2020  WBC 4.0 - 10.5 K/uL 9.0 8.4 8.2  Hemoglobin 13.0 - 17.0 g/dL 14.6 14.3 13.5  Hematocrit 39.0 - 52.0 % 44.1 44.2 41.1  Platelets 150 - 400 K/uL 299 327 343    CMP Latest Ref Rng & Units 06/10/2021 01/21/2021 10/19/2020   Glucose 70 - 99 mg/dL 107(H) 102(H) 93  BUN 8 - 23 mg/dL 18 20 13   Creatinine 0.61 - 1.24 mg/dL 1.03 0.98 0.99  Sodium 135 - 145 mmol/L 141 140 142  Potassium 3.5 - 5.1 mmol/L 4.1 4.3 4.0  Chloride 98 - 111 mmol/L 109 110 108  CO2 22 - 32 mmol/L 25 25 28   Calcium 8.9 - 10.3 mg/dL 9.2 9.1 9.2  Total Protein 6.5 - 8.1 g/dL 7.2 7.0 6.9  Total Bilirubin 0.3 - 1.2 mg/dL 0.4 0.3 0.3  Alkaline Phos 38 - 126 U/L 95 122 101  AST 15 - 41 U/L 18 15 15   ALT 0 - 44 U/L 16 17 12      RADIOGRAPHIC STUDIES: I have personally reviewed the radiological images as listed and agreed with the  findings in the report: no evidence of recurrent disease.  CT CHEST ABDOMEN PELVIS W CONTRAST  Result Date: 06/14/2021 CLINICAL DATA:  Hodgkin's lymphoma diagnosed December 17, 2019 status post radiation and chemotherapy completed 07/08/2020 EXAM: CT CHEST, ABDOMEN, AND PELVIS WITH CONTRAST TECHNIQUE: Multidetector CT imaging of the chest, abdomen and pelvis was performed following the standard protocol during bolus administration of intravenous contrast. CONTRAST:  115mL OMNIPAQUE IOHEXOL 300 MG/ML  SOLN COMPARISON:  Multiple priors including most recent CT October 19, 2020 FINDINGS: CT CHEST FINDINGS Cardiovascular: Scattered aortic atherosclerosis without aneurysmal dilation. No central pulmonary embolus. Coronary artery calcifications. Normal size heart. No significant pericardial effusion/thickening. Mediastinum/Nodes: Similar appearance of the heterogeneous thyroid with bilateral nodules and calcifications, previously evaluated by thyroid ultrasound on October 28, 2020. Stable prominent supraclavicular, mediastinal, hilar and axillary lymph nodes, none of which are pathologically enlarged by size criteria. The trachea and esophagus are grossly unremarkable. Lungs/Pleura: Mild centrilobular and paraseptal emphysema. No suspicious pulmonary nodules or masses. No pleural effusion. No pneumothorax. Musculoskeletal: No  aggressive lytic or blastic lesion of bone. Spondylosis. CT ABDOMEN PELVIS FINDINGS Hepatobiliary: No suspicious hepatic lesion. Gallbladder is unremarkable. No biliary ductal dilation. Pancreas: Within normal limits. Spleen: Within normal limits. Adrenals/Urinary Tract: Stable benign left adrenal adenoma measuring 2.7 cm. The right adrenal glands unremarkable. No hydronephrosis. Exophytic left interpolar renal cyst. Bilateral subcentimeter hypodense renal lesions which are technically too small to accurately characterize but statistically most likely represent cysts. Urinary bladder is decompressed limiting evaluation. Stomach/Bowel: Radiopaque enteric contrast traverses the hepatic flexure. Stomach is grossly unremarkable. No pathologic dilation of small bowel. The appendix and terminal ileum are grossly unremarkable. Colonic diverticulosis without findings of acute diverticulitis. Vascular/Lymphatic: Aortic atherosclerosis without aneurysmal dilation. Similar prominent portacaval and retroperitoneal lymph nodes, which were not previously PET avid, for instance a 7 mm left para-aortic lymph node on image 78/2 and a 7 mm portacaval lymph node on image 63/2. No pathologically enlarged abdominal or pelvic lymph nodes. Reproductive: Dystrophic prostatic calcifications. Other: No abdominopelvic ascites. Musculoskeletal: Left total hip arthroplasty. Degenerative change of the right hip. Degenerative changes of the bilateral SI joints with partial bony ankylosis. Multilevel degenerative changes of the spine. No aggressive lytic or blastic lesion of bone. IMPRESSION: 1. Stable examination without pathologically enlarged lymph nodes above or below the diaphragm. 2. Stable benign left adrenal adenoma. 3. Colonic diverticulosis without findings of acute diverticulitis. 4. Similar appearance of the heterogeneous thyroid with bilateral nodules and calcifications, previously evaluated by thyroid ultrasound on October 28, 2020  at least 1 of which was recommended for ultrasound-guided FNA, please refer to this report for guidance and recommendations regarding the multiple thyroid nodules. 5. Aortic Atherosclerosis (ICD10-I70.0) and Emphysema (ICD10-J43.9). Electronically Signed   By: Dahlia Bailiff MD   On: 06/14/2021 08:43    ASSESSMENT & PLAN Marc Watson 65 y.o. male with medical history significant for classical Hodgkin Lymphoma in remission who presents for a follow up visit.  Overall Marc Watson has been well in the interim since our last visit.  His energy has been good and he has been intentionally losing weight. Otherwise he has no questions or concerns at this time.  His most recent CT scan showed no evidence of residual recurrent disease.  The plan was for AVD chemotherapy x 4 total cycles followed by ISRT. After all treatments would reassess with PET CT scan (recommended within 3 months of completion of therapy). The chemotherapy regimen consisted of doxorubicin 25mg /m2, vinblastine 6mg /m2, and dacarbazine 375mg /m2 on  Day 1 and Day 15. Bleomycin will be held on concern for his lung function and for his age. He started Cycle 1 Day 1 on 01/16/2020.  Follow-up PET CT scan on 03/08/2020 showed complete response to therapy.  As such she entered surveillance and is receiving CT scans every 6 months for the first 2 years and then yearly after that.  IPS Score For Hodgkin Lymphoma: 2 (81% OS, 67% freedom from progression)  # Classical Hodgkin's Lymphoma, Stage II (Nonbulky, Favorable) in Remission --completed AVD chemotherapy (with no bleomycin) and radiation therapy.   --restaging post Cycle 2 PET scan performed 03/08/2020, showed significant improvement, with complete resolution of the right axillary adenopathy (the larger previous dominant lymph nodes are currently no longer present compatible with Deauville 1 disease); reduced activity and size of lymph nodes in the neck, currently Deauville 2; and Deauville 2  activity in the AP window lymph node.Most recent CT performed approximately 12 weeks after completion of radiation therapy (July 2022), no evidence of residual disease or lymph adenopathy.  --patient has undergone appropriate pre-treatment testing including TTE, port placement, and blood work. PFTs were not able to be performed due to issues with scheduling during the [place] pandemic. Bleomycin held due to age and concern for underlying lung disease --stable labs, rechecked today.  --appreciate assessment by radiation oncology for post chemotherapy RT to the affected lymph nodes. Therapy now completed.  --Per NCCN guidelines, will have clinic f/u q 3-6 months for 1-2 years followed by every 6-12 months until year 3, then annually. CT scan no more often than q 6 months for the first 2 years following therapy --plan to RTC in 6 months with repeat CT scan   #Thyroid Nodule, stable --found incidentally on last PET CT scan on 12/31/2019 --US performed, appears stable based on prior imaging.  --discussed with patient, we will proceed with biopsy at this time. --continue to monitor    Orders Placed This Encounter  Procedures   CT CHEST ABDOMEN PELVIS W CONTRAST    Standing Status:   Future    Standing Expiration Date:   06/16/2022    Order Specific Question:   If indicated for the ordered procedure, I authorize the administration of contrast media per Radiology protocol    Answer:   Yes    Order Specific Question:   Preferred imaging location?    Answer:   [facility]    Order Specific Question:   Release to patient    Answer:   Immediate    Order Specific Question:   Is Oral Contrast requested for this exam?    Answer:   Yes, Per Radiology protocol    Order Specific Question:   Reason for Exam (SYMPTOM  OR DIAGNOSIS REQUIRED)    Answer:   hx of Hodgkins lymphoma, assess for recurrence   [place] FNA BX THYROID 1ST LESION AFIRMA    Standing Status:   Future    Standing Expiration Date:    06/16/2022    Order Specific Question:   Reason for Exam (SYMPTOM  OR DIAGNOSIS REQUIRED)    Answer:   thyroid nodule noted on serial imaging.    Order Specific Question:   Preferred location?    Answer:   [facility]   All questions were answered. The patient knows to call the clinic with any problems, questions or concerns.  A total of more than 30 minutes were spent on this encounter and over half of that time was spent on  counseling and coordination of care as outlined above.   [name], MD Department of Hematology/Oncology [place] at [facility] Phone: [phone] Pager: [phone] Email: [email or web address]  06/16/2021 11:20 AM

## 2021-06-17 ENCOUNTER — Telehealth: Payer: Self-pay | Admitting: Hematology and Oncology

## 2021-06-17 NOTE — Telephone Encounter (Signed)
Scheduled per los. Called and left msg. Mailed printout  °

## 2021-07-05 ENCOUNTER — Ambulatory Visit
Admission: RE | Admit: 2021-07-05 | Discharge: 2021-07-05 | Disposition: A | Discharge status: Home / Self Care | Payer: Medicare Other | Source: Ambulatory Visit | Attending: Hematology and Oncology | Admitting: Hematology and Oncology

## 2021-07-05 ENCOUNTER — Other Ambulatory Visit (HOSPITAL_COMMUNITY)
Admission: RE | Admit: 2021-07-05 | Discharge: 2021-07-05 | Disposition: A | Discharge status: Home / Self Care | Payer: Medicare Other | Source: Ambulatory Visit | Attending: Student | Admitting: Student

## 2021-07-05 DIAGNOSIS — E041 Nontoxic single thyroid nodule: Secondary | ICD-10-CM

## 2021-07-08 LAB — CYTOLOGY - NON PAP

## 2021-07-11 ENCOUNTER — Telehealth: Payer: Self-pay | Admitting: *Deleted

## 2021-07-11 DIAGNOSIS — E7800 Pure hypercholesterolemia, unspecified: Secondary | ICD-10-CM | POA: Diagnosis not present

## 2021-07-11 DIAGNOSIS — R739 Hyperglycemia, unspecified: Secondary | ICD-10-CM | POA: Diagnosis not present

## 2021-07-11 DIAGNOSIS — Z0000 Encounter for general adult medical examination without abnormal findings: Secondary | ICD-10-CM | POA: Diagnosis not present

## 2021-07-11 DIAGNOSIS — Z125 Encounter for screening for malignant neoplasm of prostate: Secondary | ICD-10-CM | POA: Diagnosis not present

## 2021-07-11 DIAGNOSIS — I10 Essential (primary) hypertension: Secondary | ICD-10-CM | POA: Diagnosis not present

## 2021-07-11 NOTE — Telephone Encounter (Signed)
Received call from pt regarding his Thyroid biopsy results. He had the biopsy @ [facility] on 07/05/21.  Advised that the results were available and that I would have Dr. Lorenso Courier review the results.  Advised that either Dr. Lorenso Courier or I would call him back. Pt voiced understanding.  Please advise

## 2021-07-14 IMAGING — CT CT CHEST-ABD-PELV W/ CM
3 of 6 series · 15 of 36 positions shown, 17 images · IV contrast (APPLIED)
Comparison: PET-CT, 03/08/2020, 12/31/2019

CLINICAL DATA: Hodgkin's lymphoma, assess treatment response

EXAM:
CT CHEST, ABDOMEN, AND PELVIS WITH CONTRAST
TECHNIQUE: Multidetector CT imaging of the chest, abdomen and pelvis was
performed following the standard protocol during bolus
administration of intravenous contrast.
CONTRAST:  100mL OMNIPAQUE IOHEXOL 300 MG/ML SOLN, additional oral
enteric contrast

[Series 2: cap with · axial · 0.98mm/px · z∈[-824,-254]mm · 9 of 144 slices shown, 11 images]
[im 15/144  mediastinal]
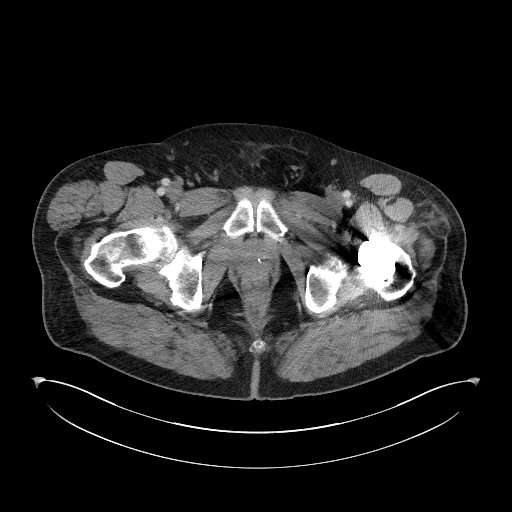
[im 15/144  bone]
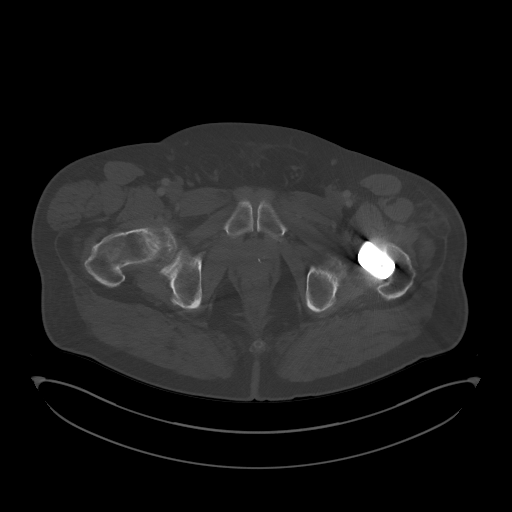
[im 29/144  mediastinal]
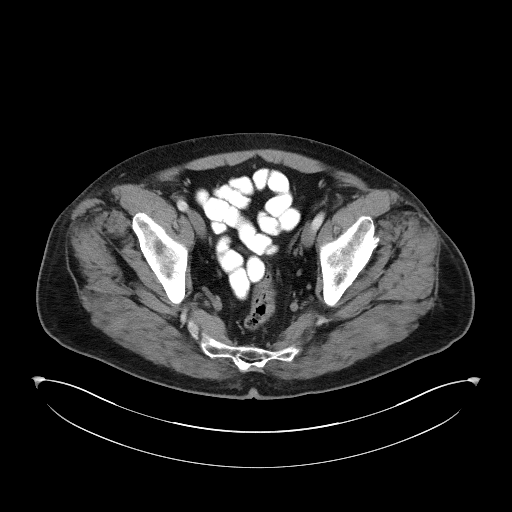
[im 43/144  mediastinal]
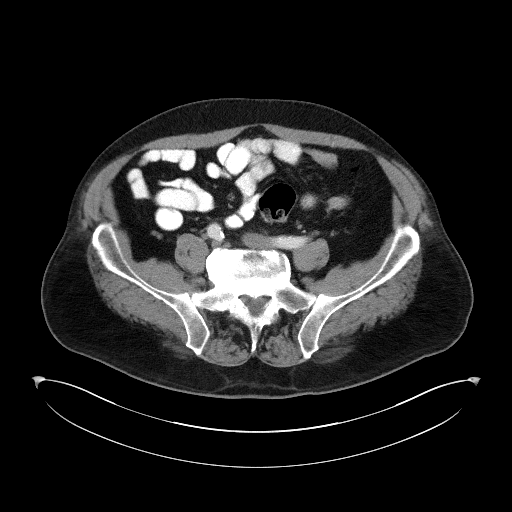
[im 58/144  mediastinal]
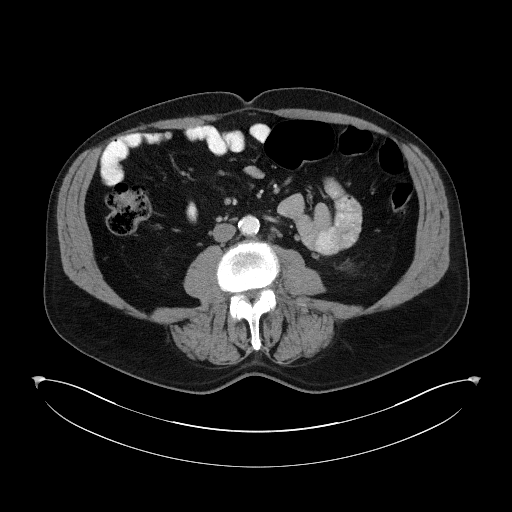
[im 72/144  mediastinal]
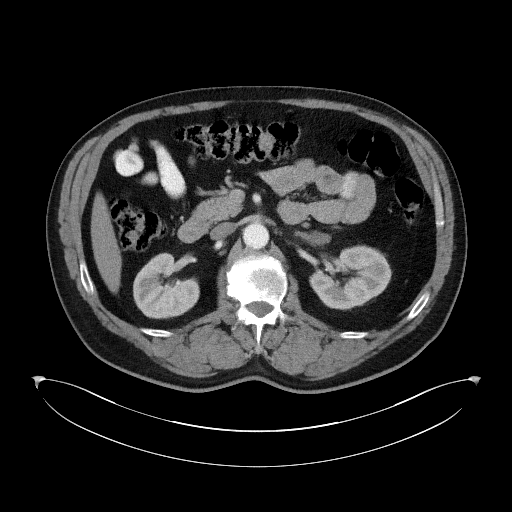
[im 86/144  mediastinal]
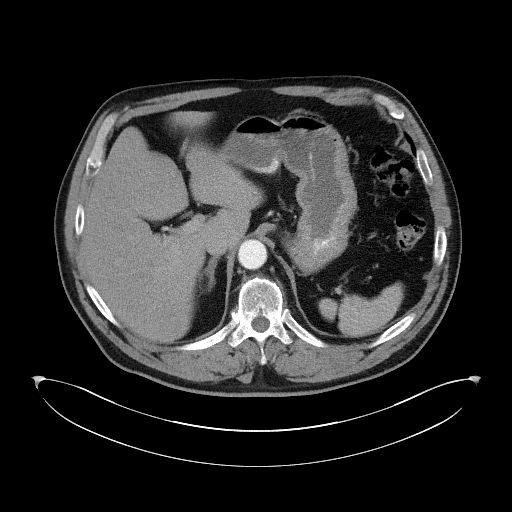
[im 101/144  mediastinal]
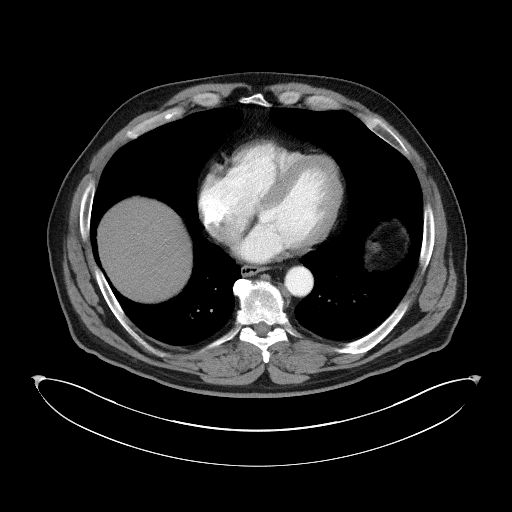
[im 115/144  mediastinal]
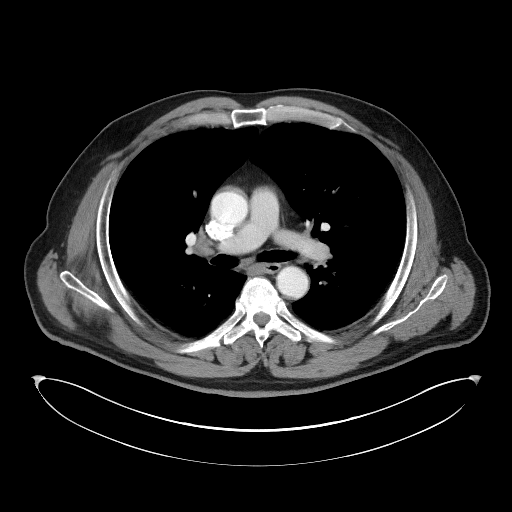
[im 129/144  mediastinal]
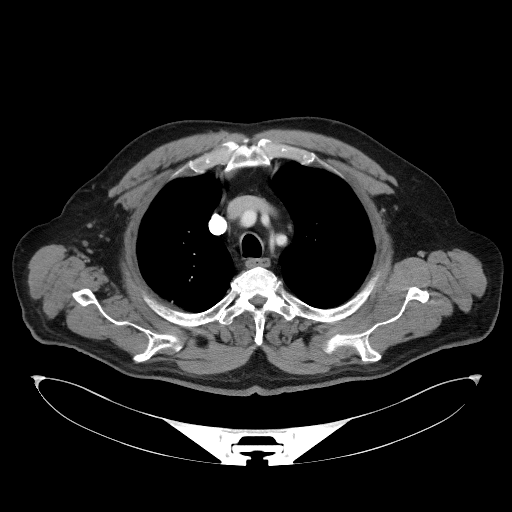
[im 129/144  bone]
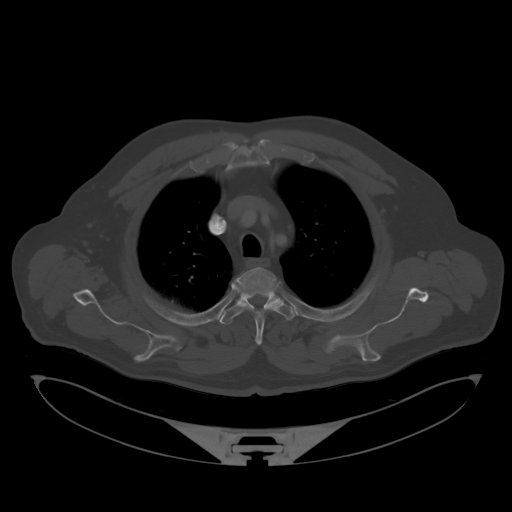

[Series 6: lung · axial · 0.78mm/px · z∈[-475,-395]mm · 3 of 162 slices shown]
[im 14/162  bone]
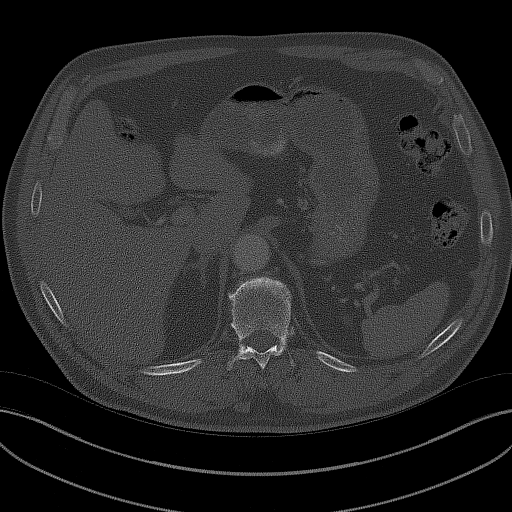
[im 41/162  bone]
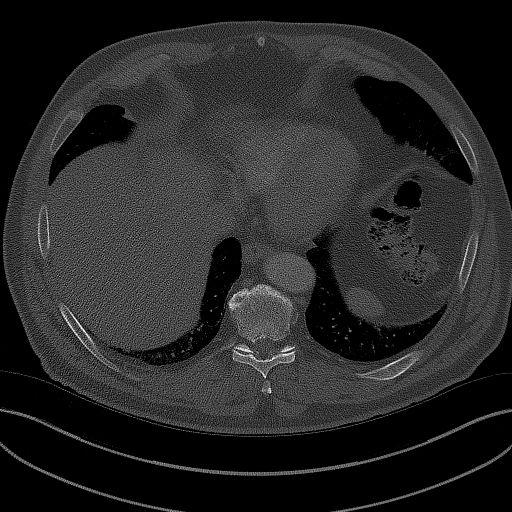
[im 54/162  bone]
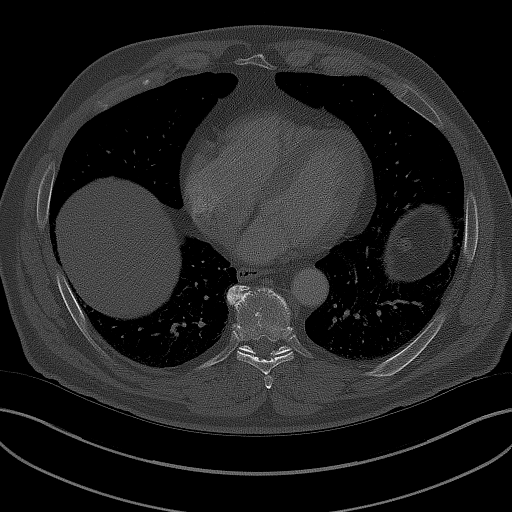

[Series 7: coronals · coronal · 0.94mm/px · 3 of 169 slices shown]
[im 34/169  mediastinal]
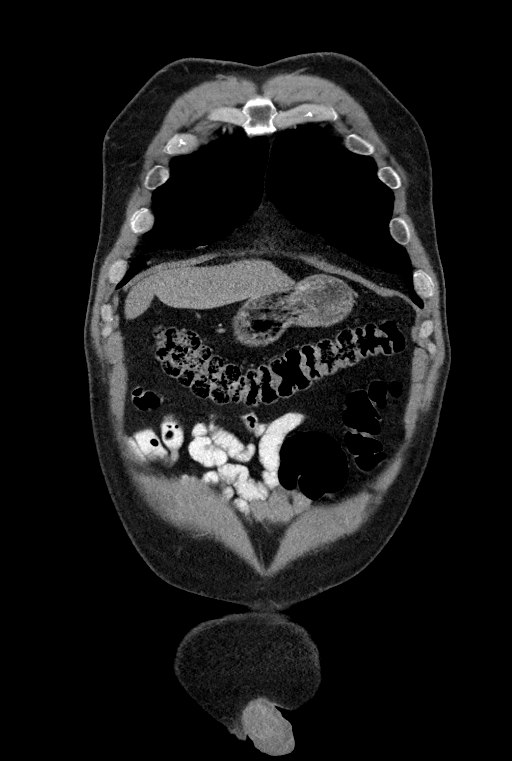
[im 68/169  mediastinal]
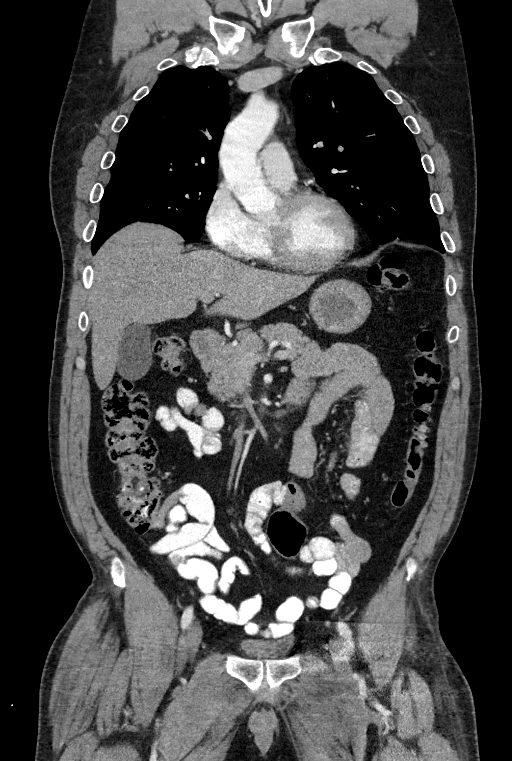
[im 101/169  mediastinal]
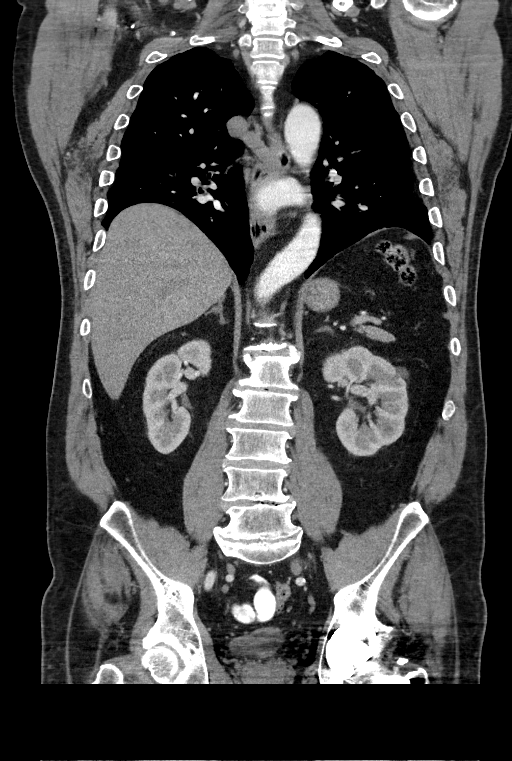

[15 of 36 positions shown; findings below may reference images not displayed]

FINDINGS: CT CHEST FINDINGS

Cardiovascular: Scattered aortic atherosclerosis. Normal heart size.
Scattered coronary artery calcifications. No pericardial effusion.

Mediastinum/Nodes: No enlarged mediastinal, hilar, or axillary lymph
nodes. Redemonstrated heterogeneous thyroid with nodules and
calcifications bilaterally. Trachea, and esophagus demonstrate no
significant findings.

Lungs/Pleura: Mild centrilobular and paraseptal emphysema. No
pleural effusion or pneumothorax.

Musculoskeletal: No chest wall mass or suspicious bone lesions
identified.

CT ABDOMEN PELVIS FINDINGS

Hepatobiliary: No solid liver abnormality is seen. No gallstones,
gallbladder wall thickening, or biliary dilatation.

Pancreas: Unremarkable. No pancreatic ductal dilatation or
surrounding inflammatory changes.

Spleen: Normal in size without significant abnormality.

Adrenals/Urinary Tract: Redemonstrated definitively benign fat
containing adenoma of the lateral limb of the left adrenal gland.
Kidneys are normal, without renal calculi, solid lesion, or
hydronephrosis. Bladder is unremarkable.

Stomach/Bowel: Stomach is within normal limits. Appendix appears
normal. No evidence of bowel wall thickening, distention, or
inflammatory changes.

Vascular/Lymphatic: Aortic atherosclerosis. Unchanged subcentimeter
retroperitoneal and portacaval lymph nodes, not previously PET avid
(series 2, image 82, 67).

Reproductive: No mass or other abnormality.

Other: No abdominal wall hernia or abnormality. No abdominopelvic
ascites.

Musculoskeletal: No acute or significant osseous findings.
IMPRESSION: 1. No evidence of recurrent lymphadenopathy in the chest, abdomen,
or pelvis. Unchanged subcentimeter retroperitoneal and portacaval
lymph nodes, not previously PET avid. Right axillary lymphadenopathy
remains completely resolved.

2.  Emphysema.

3. Redemonstrated heterogeneous thyroid with multiple nodules and
calcifications. Recommend dedicated thyroid ultrasound when
clinically appropriate. Recommend thyroid US (ref: [HOSPITAL].
[DATE]): 143-50).

## 2021-07-14 IMAGING — CT CT NECK W/ CM
3 of 5 series · 11 of 33 positions shown, 13 images · IV contrast (omnipaque)
Comparison: None.

CLINICAL DATA: Hodgkin's lymphoma status post chemo radiation

EXAM:
CT NECK WITH CONTRAST
TECHNIQUE: Multidetector CT imaging of the neck was performed using the
standard protocol following the bolus administration of intravenous
contrast.
CONTRAST:  100mL OMNIPAQUE IOHEXOL 300 MG/ML  SOLN

[Series 5: orthogonal (person_name) · axial · 0.39mm/px · z∈[-279,-115]mm · 3 of 170 slices shown, 4 images]
[im 43/170  soft-tissue]
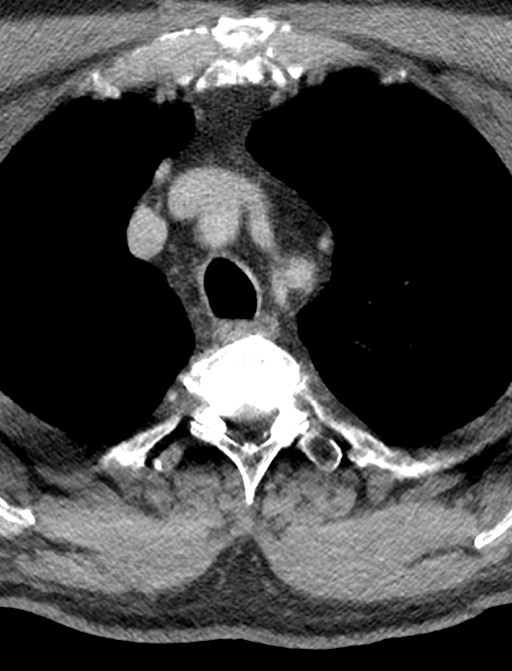
[im 43/170  bone]
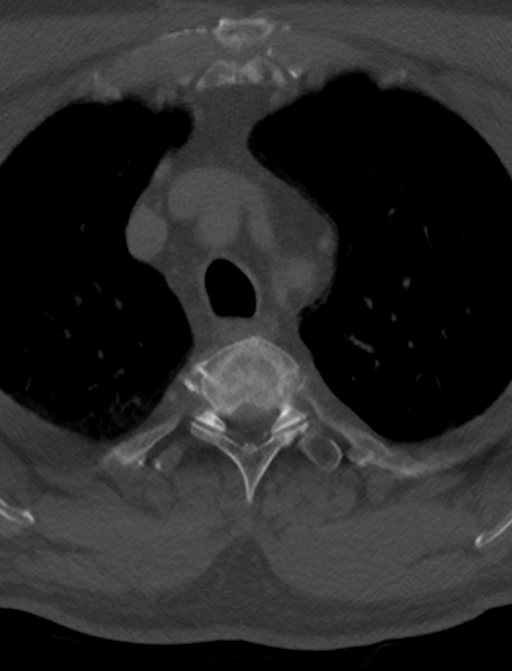
[im 85/170  bone]
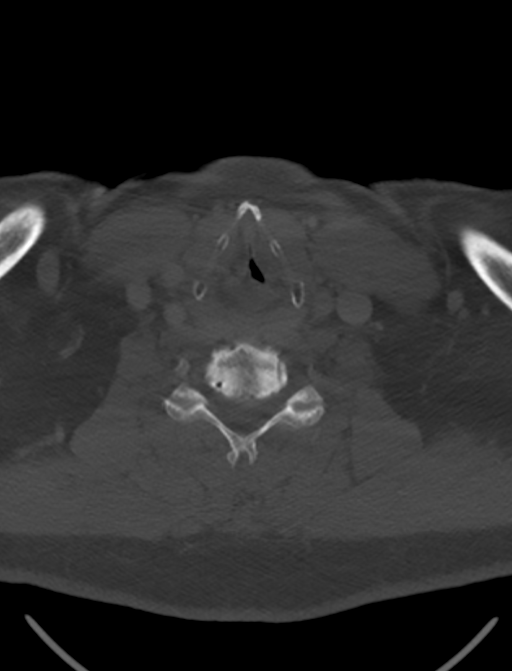
[im 127/170  bone]
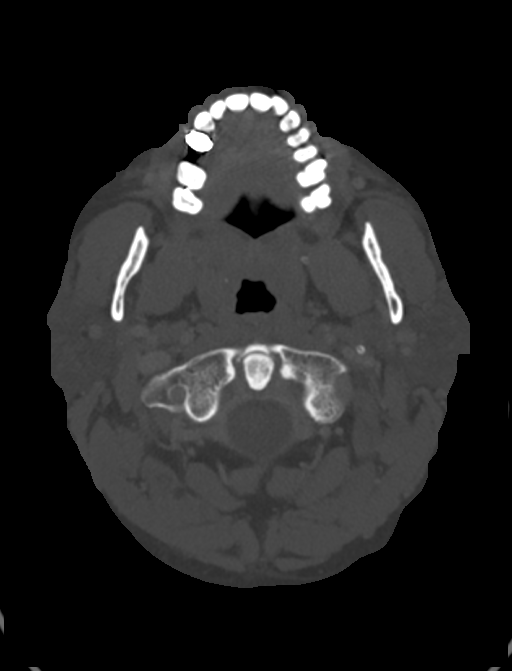

[Series 6: cor neck · coronal · 0.39mm/px · 3 of 132 slices shown]
[im 29/132  bone]
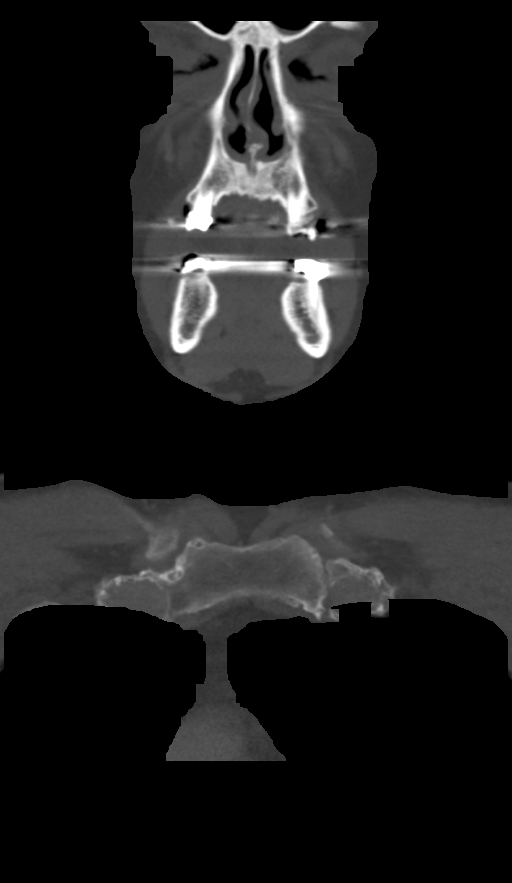
[im 54/132  bone]
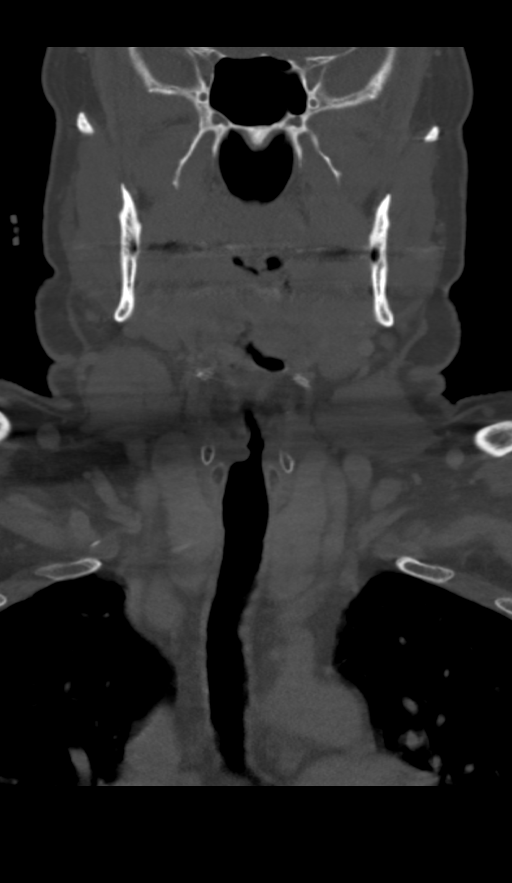
[im 79/132  bone]
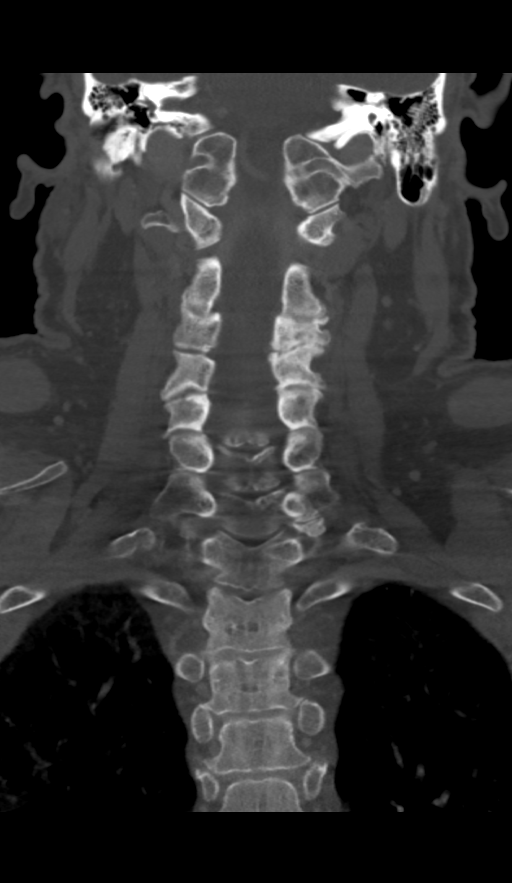

[Series 7: sag neck · sagittal · 0.51mm/px · 5 of 101 slices shown, 6 images]
[im 34/101  bone]
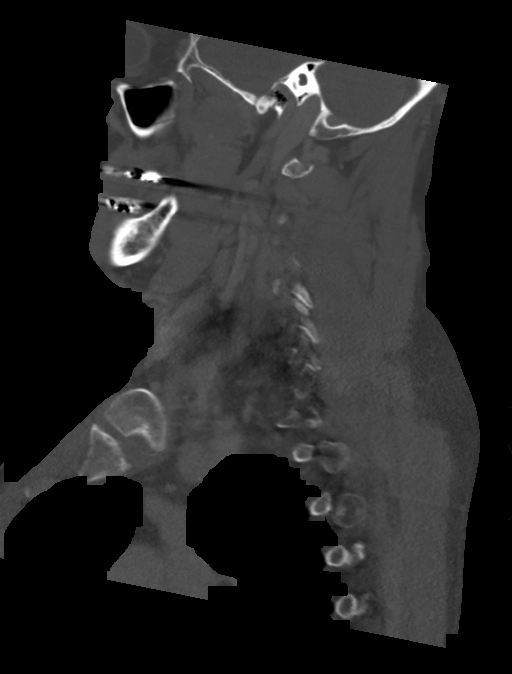
[im 42/101  bone]
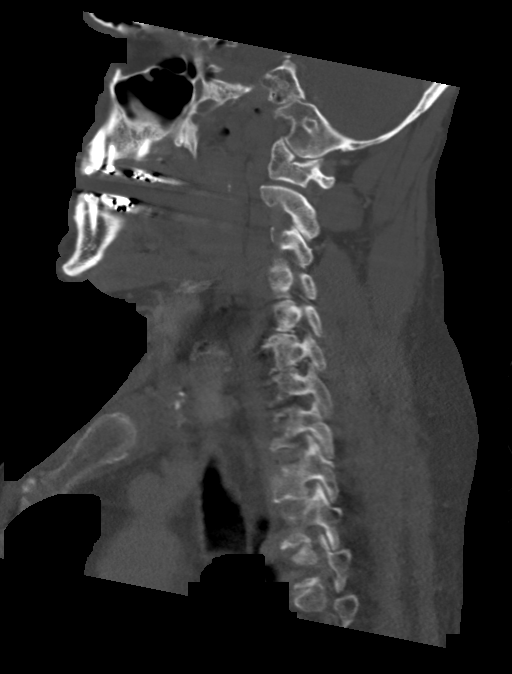
[im 51/101  soft-tissue]
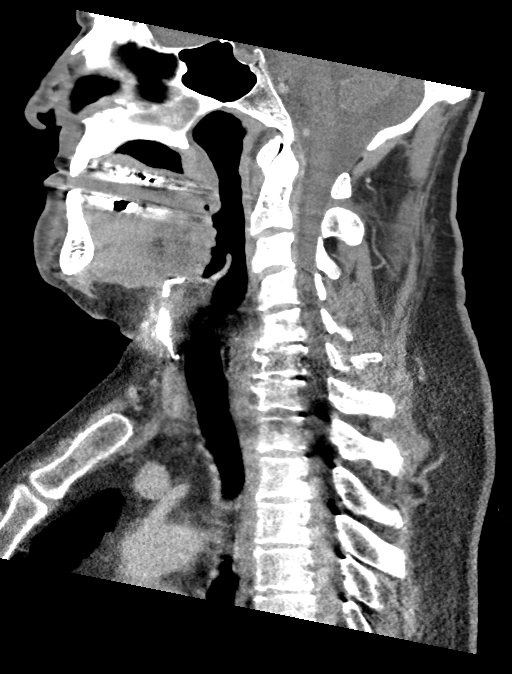
[im 51/101  bone]
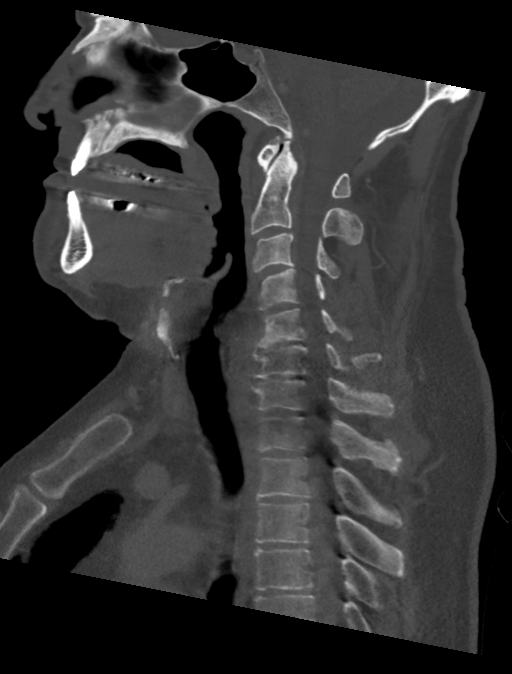
[im 59/101  bone]
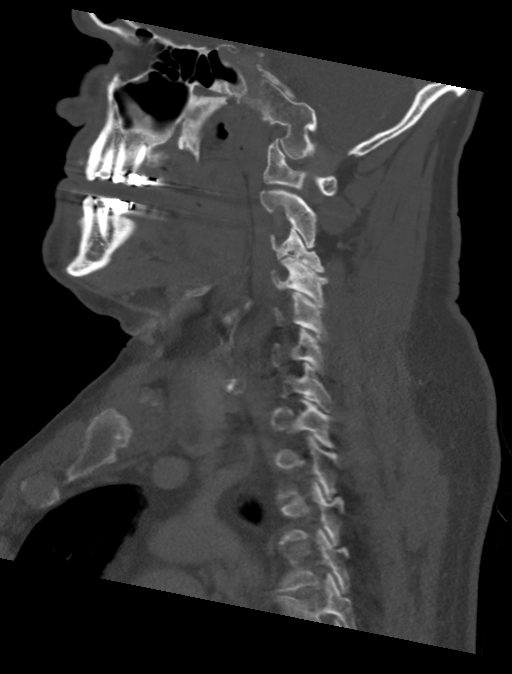
[im 67/101  bone]
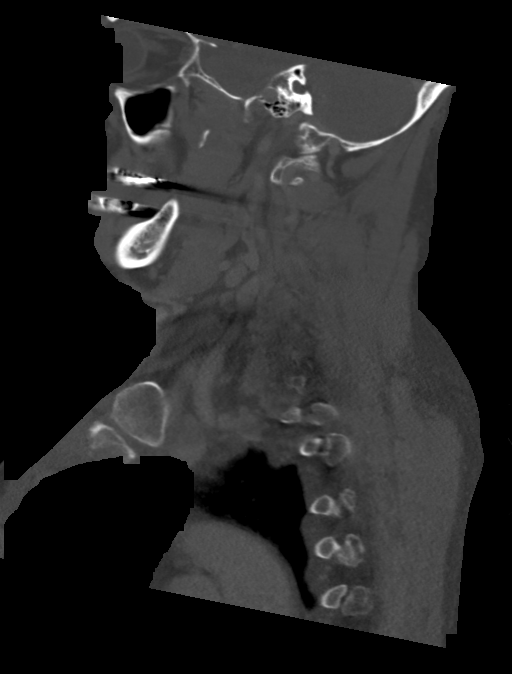

[11 of 33 positions shown; findings below may reference images not displayed]

FINDINGS: Pharynx and larynx: Streak artifact from shoulder arthroplasty
partially obscures the larynx. There is mild laryngeal soft tissue
thickening that is likely a sequela of radiation. Otherwise, normal
pharyngeal structures.

Salivary glands: No inflammation, mass, or stone.

Thyroid: Mildly enlarged thyroid gland with multiple partially
calcified nodules.

Lymph nodes: None enlarged or abnormal density.

Vascular: Negative.

Limited intracranial: Negative.

Visualized orbits: Negative.

Mastoids and visualized paranasal sinuses: Clear.

Skeleton: No acute or aggressive process.

Upper chest: Negative.

Other: None.
IMPRESSION: 1. No cervical lymphadenopathy.
2. Laryngeal soft tissue thickening may be a sequela of radiation.
3. Mildly enlarged thyroid gland with multiple partially calcified
nodules. Recommend thyroid ultrasound (ref: [HOSPITAL]. 3962

## 2021-07-19 DIAGNOSIS — E782 Mixed hyperlipidemia: Secondary | ICD-10-CM | POA: Diagnosis not present

## 2021-07-19 DIAGNOSIS — Z0000 Encounter for general adult medical examination without abnormal findings: Secondary | ICD-10-CM | POA: Diagnosis not present

## 2021-07-19 DIAGNOSIS — I10 Essential (primary) hypertension: Secondary | ICD-10-CM | POA: Diagnosis not present

## 2021-07-19 DIAGNOSIS — C8190 Hodgkin lymphoma, unspecified, unspecified site: Secondary | ICD-10-CM | POA: Diagnosis not present

## 2021-07-23 IMAGING — US US THYROID
2 of 3 series · 12 of 25 positions shown · non-contrast
Comparison: None.

CLINICAL DATA: 64-year-old male with a history of thyroid nodules.

EXAM:
THYROID ULTRASOUND
TECHNIQUE: Ultrasound examination of the thyroid gland and adjacent soft
tissues was performed.

[Series 1: us thyroid · 51 acquisitions, 11 frames shown (1 of 2)]
[im 3/51]
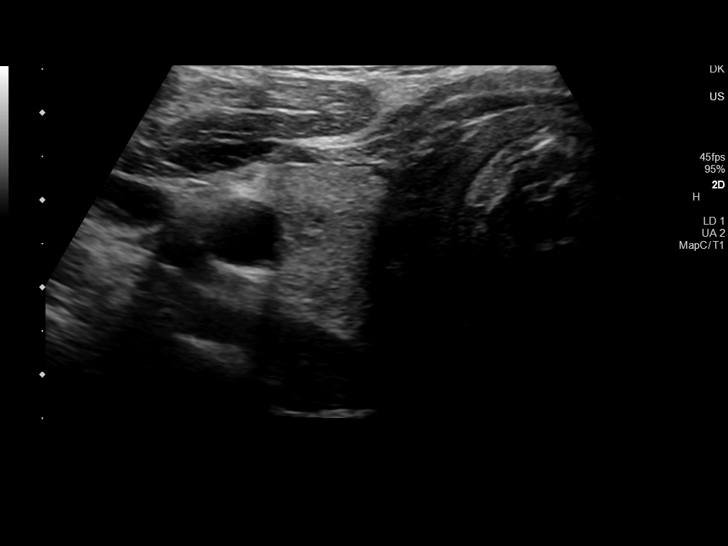
[im 7/51]
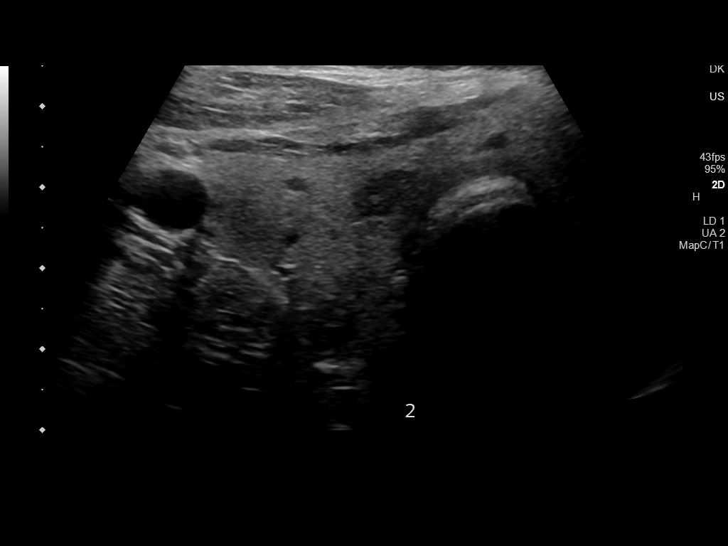
[im 12/51]
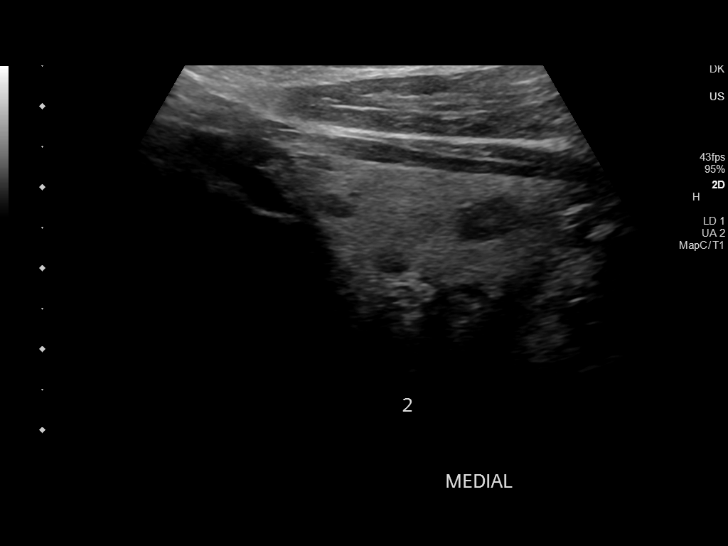
[im 16/51]
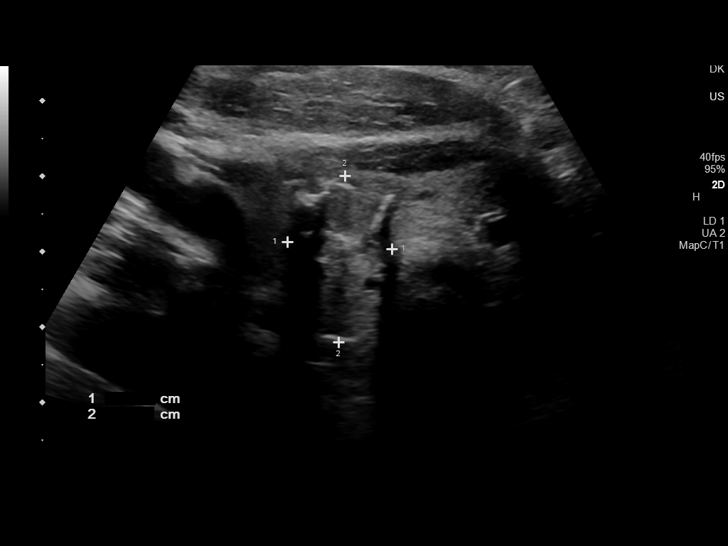
[im 21/51]
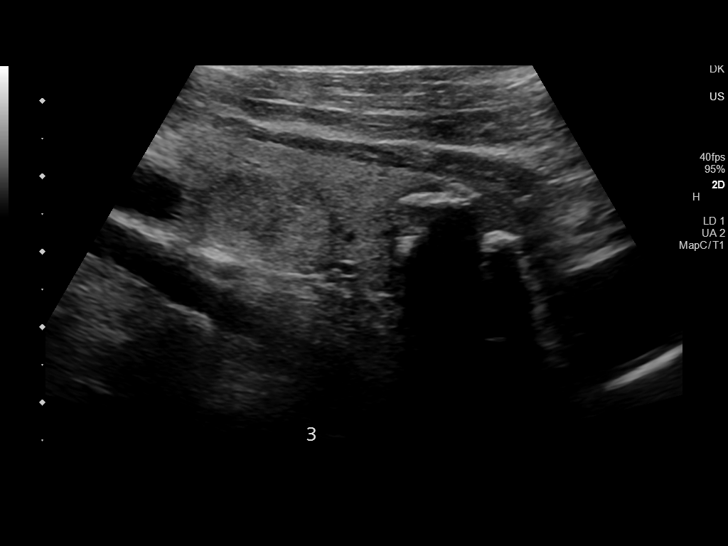
[im 26/51]
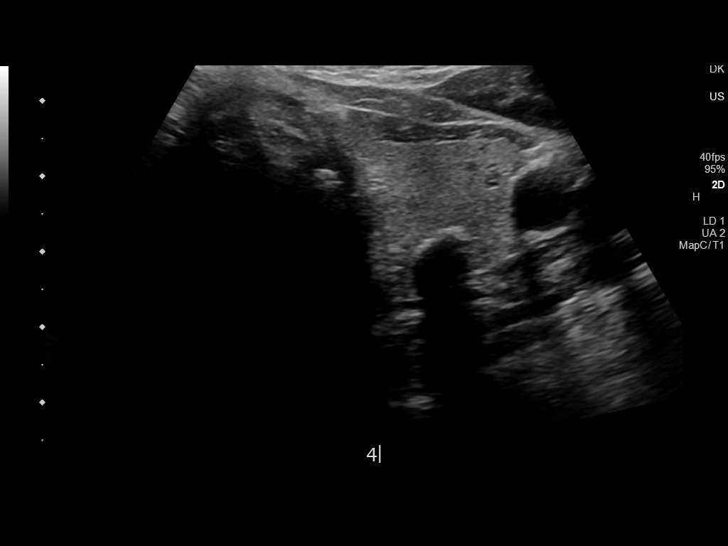
[im 30/51]
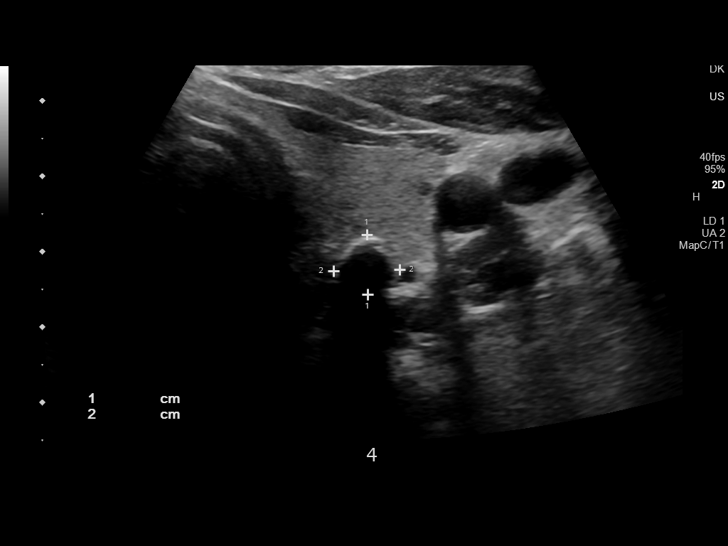
[im 35/51]
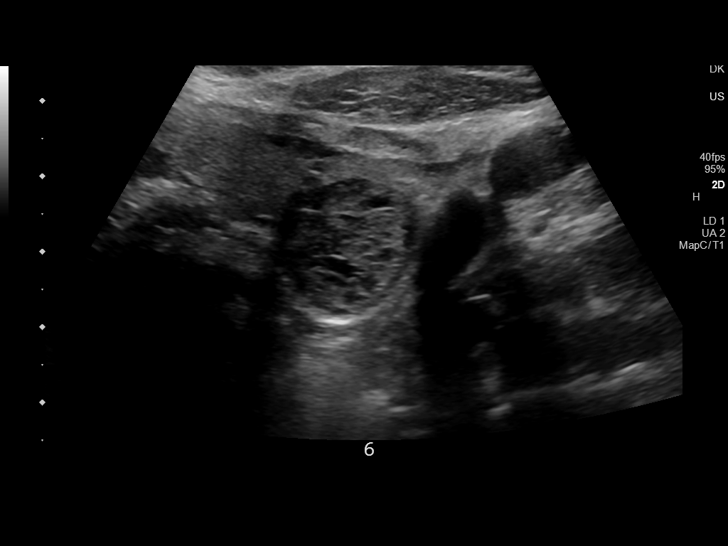
[im 39/51]
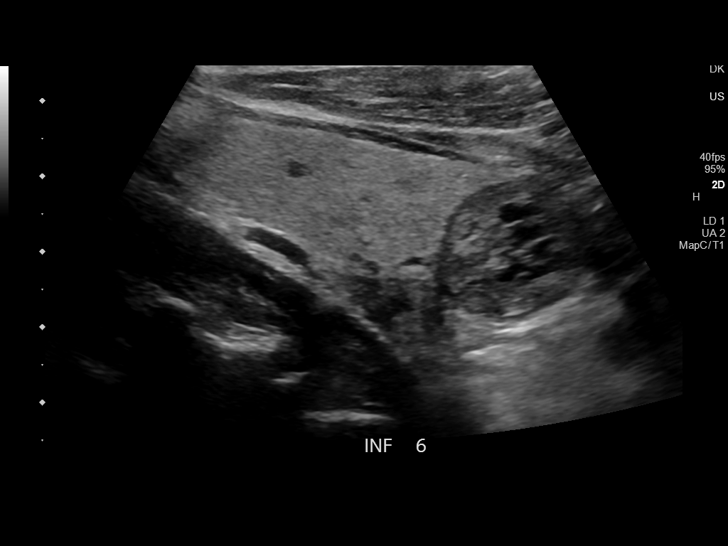
[im 44/51]
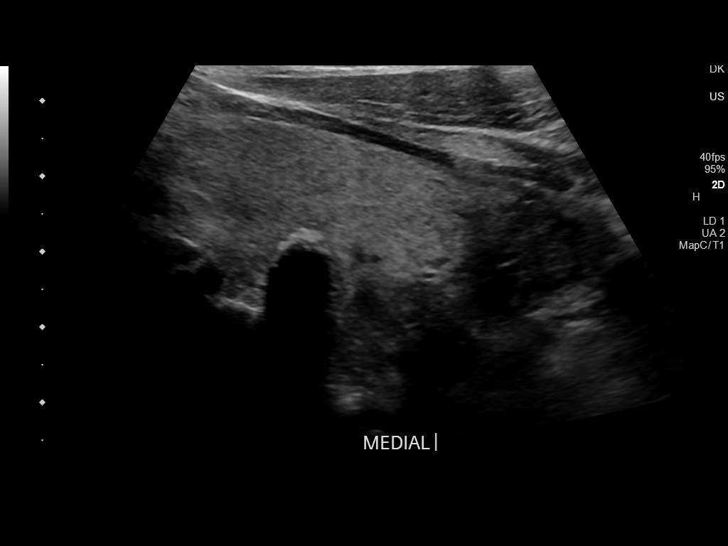
[im 48/51]
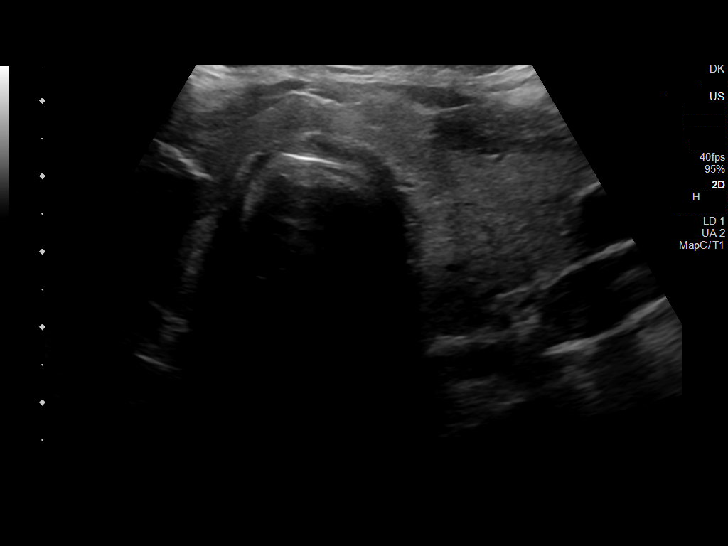

[Series 2: us thyroid · 1 of 2 slices shown (2 of 2)]
[im 1/2]
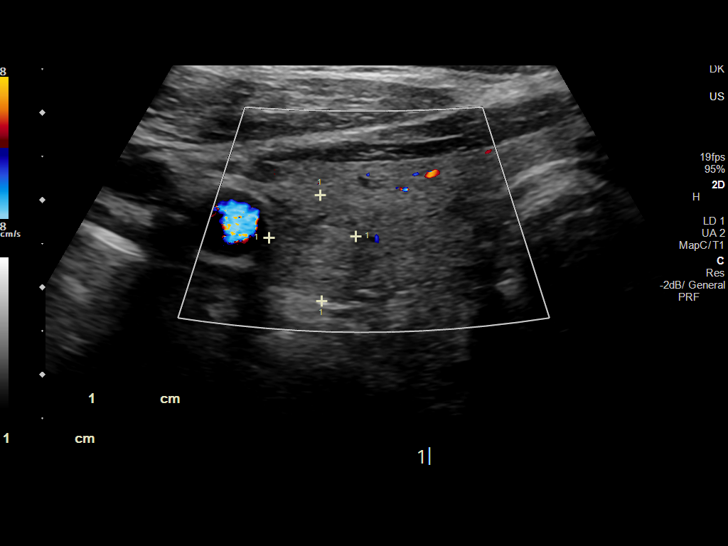

[12 of 25 positions shown; findings below may reference images not displayed]

FINDINGS: Parenchymal Echotexture: Mildly heterogenous

Isthmus: 0.6 cm

Right lobe: 5.8 cm x 2.5 cm x 2.6 cm

Left lobe: 6.3 cm x 2.3 cm x 2.1 cm

_________________________________________________________

Estimated total number of nodules >/= 1 cm: 4

Number of spongiform nodules >/=  2 cm not described below (TR1): 0

Number of mixed cystic and solid nodules >/= 1.5 cm not described
below (TR2): 0

_________________________________________________________

Nodule # 1:

Location: Right; Superior

Maximum size: 1.9 cm; Other 2 dimensions: 1.2 cm x 1.0 cm

Composition: mixed cystic and solid (1)

Echogenicity: isoechoic (1)

Shape: not taller-than-wide (0)

Margins: smooth (0)

Echogenic foci: none (0)

ACR TI-RADS total points: 2.

ACR TI-RADS risk category: TR2 (2 points).

ACR TI-RADS recommendations:

Nodule does not meet criteria for surveillance or biopsy

_________________________________________________________

Nodule # 2:

Location: Right; Mid

Maximum size: 0.8 cm; Other 2 dimensions: 0.8 cm x 0.6 cm

Composition: cannot determine (2)

Echogenicity: hypoechoic (2)

Shape: not taller-than-wide (0)

Margins: smooth (0)

Echogenic foci: none (0)

ACR TI-RADS total points: 4.

ACR TI-RADS risk category: TR4 (4-6 points).

ACR TI-RADS recommendations:

Nodule does not meet criteria for surveillance or biopsy

_________________________________________________________

Nodule # 3:

Location: Right; Inferior

Maximum size: 2.2 cm; Other 2 dimensions: 1.4 cm x 1.9 cm

Composition: cannot determine (2)

Echogenicity: cannot determine (1)

Shape: taller-than-wide (3)

Margins: cannot determine (0)

Echogenic foci: peripheral calcifications (2)

ACR TI-RADS total points: 8.

ACR TI-RADS risk category: TR5 (>/= 7 points).

ACR TI-RADS recommendations:

Nodule meets criteria for biopsy

_________________________________________________________

Nodule # 4:

Location: Left; Mid

Maximum size: 0.9 cm; Other 2 dimensions: 0.8 cm x 0.9 cm

Composition: cannot determine (2)

Echogenicity: cannot determine (1)

Shape: not taller-than-wide (0)

Margins: cannot determine (0)

Echogenic foci: peripheral calcifications (2)

ACR TI-RADS total points: 5.

ACR TI-RADS risk category: TR4 (4-6 points).

ACR TI-RADS recommendations:

Nodule does not meet criteria for surveillance or biopsy

_________________________________________________________

Nodule # 5:

Location: Left; Inferior

Maximum size: 1.0 cm; Other 2 dimensions: 0.8 cm x 0.8 cm

Composition: spongiform (0)

ACR TI-RADS recommendations:

Spongiform nodule does not meet criteria for surveillance or biopsy

_________________________________________________________

Nodule # 6:

Location: Left; Inferior

Maximum size: 2.0 cm; Other 2 dimensions: 1.8 cm x 1.9 cm

Composition: spongiform (0)

ACR TI-RADS recommendations:

Spongiform nodule does not meet criteria for surveillance or biopsy

_________________________________________________________

No adenopathy
IMPRESSION: Multinodular thyroid.

Right inferior thyroid nodule (labeled 3, 2.2 cm, TR 5) meets
criteria for biopsy, as designated by the newly established ACR
TI-RADS criteria, and referral for biopsy is recommended.

Recommendations follow those established by the new ACR TI-RADS
criteria ([HOSPITAL] 3164;[DATE]).

## 2021-08-12 ENCOUNTER — Telehealth: Payer: Self-pay | Admitting: *Deleted

## 2021-08-12 DIAGNOSIS — F17210 Nicotine dependence, cigarettes, uncomplicated: Secondary | ICD-10-CM | POA: Diagnosis not present

## 2021-08-12 DIAGNOSIS — E669 Obesity, unspecified: Secondary | ICD-10-CM | POA: Diagnosis not present

## 2021-08-12 DIAGNOSIS — I10 Essential (primary) hypertension: Secondary | ICD-10-CM | POA: Diagnosis not present

## 2021-08-12 DIAGNOSIS — G4719 Other hypersomnia: Secondary | ICD-10-CM | POA: Diagnosis not present

## 2021-08-12 NOTE — Telephone Encounter (Signed)
Received call from pt regarding results of thyroid biopsy. Discussed with Dr. Lorenso Courier. He advised that the results were indeterminate, likely benign.  Advised that we would continue to follow with him about this. Advised pt of the above. And asked pt to call if he noticed any increase in size of thyroid area.  Pt satisfied with answer and said he would call with any changes.

## 2021-09-19 ENCOUNTER — Inpatient Hospital Stay: Payer: Medicare Other | Attending: Hematology and Oncology

## 2021-09-19 ENCOUNTER — Encounter: Payer: Self-pay | Admitting: Hematology and Oncology

## 2021-09-19 ENCOUNTER — Other Ambulatory Visit: Payer: Self-pay

## 2021-09-19 DIAGNOSIS — K219 Gastro-esophageal reflux disease without esophagitis: Secondary | ICD-10-CM | POA: Diagnosis not present

## 2021-09-19 DIAGNOSIS — E041 Nontoxic single thyroid nodule: Secondary | ICD-10-CM | POA: Insufficient documentation

## 2021-09-19 DIAGNOSIS — Z79899 Other long term (current) drug therapy: Secondary | ICD-10-CM | POA: Diagnosis not present

## 2021-09-19 DIAGNOSIS — Z791 Long term (current) use of non-steroidal anti-inflammatories (NSAID): Secondary | ICD-10-CM | POA: Insufficient documentation

## 2021-09-19 DIAGNOSIS — Z96611 Presence of right artificial shoulder joint: Secondary | ICD-10-CM | POA: Insufficient documentation

## 2021-09-19 DIAGNOSIS — I10 Essential (primary) hypertension: Secondary | ICD-10-CM | POA: Insufficient documentation

## 2021-09-19 DIAGNOSIS — Z8571 Personal history of Hodgkin lymphoma: Secondary | ICD-10-CM | POA: Insufficient documentation

## 2021-09-19 DIAGNOSIS — Z7952 Long term (current) use of systemic steroids: Secondary | ICD-10-CM | POA: Diagnosis not present

## 2021-09-19 DIAGNOSIS — J449 Chronic obstructive pulmonary disease, unspecified: Secondary | ICD-10-CM | POA: Insufficient documentation

## 2021-09-19 DIAGNOSIS — C8170 Other classical Hodgkin lymphoma, unspecified site: Secondary | ICD-10-CM

## 2021-09-19 LAB — CMP (CANCER CENTER ONLY)
ALT: 14 U/L (ref 0–44)
AST: 13 U/L — ABNORMAL LOW (ref 15–41)
Albumin: 3.7 g/dL (ref 3.5–5.0)
Alkaline Phosphatase: 105 U/L (ref 38–126)
Anion gap: 9 (ref 5–15)
BUN: 16 mg/dL (ref 8–23)
CO2: 24 mmol/L (ref 22–32)
Calcium: 9.9 mg/dL (ref 8.9–10.3)
Chloride: 108 mmol/L (ref 98–111)
Creatinine: 1.04 mg/dL (ref 0.61–1.24)
GFR, Estimated: >60 mL/min (ref >60)
Glucose, Bld: 83 mg/dL (ref 70–99)
Potassium: 4 mmol/L (ref 3.5–5.1)
Sodium: 141 mmol/L (ref 135–145)
Total Bilirubin: 0.2 mg/dL — ABNORMAL LOW (ref 0.3–1.2)
Total Protein: 6.8 g/dL (ref 6.5–8.1)

## 2021-09-19 LAB — CBC WITH DIFFERENTIAL (CANCER CENTER ONLY)
Abs Immature Granulocytes: 0.02 10*3/uL (ref 0.00–0.07)
Basophils Absolute: 0.1 10*3/uL (ref 0.0–0.1)
Basophils Relative: 1 %
Eosinophils Absolute: 0.2 10*3/uL (ref 0.0–0.5)
Eosinophils Relative: 2 %
HCT: 41.7 % (ref 39.0–52.0)
Hemoglobin: 13.9 g/dL (ref 13.0–17.0)
Immature Granulocytes: 0 %
Lymphocytes Relative: 32 %
Lymphs Abs: 3 10*3/uL (ref 0.7–4.0)
MCH: 29.7 pg (ref 26.0–34.0)
MCHC: 33.3 g/dL (ref 30.0–36.0)
MCV: 89.1 fL (ref 80.0–100.0)
Monocytes Absolute: 1 10*3/uL (ref 0.1–1.0)
Monocytes Relative: 11 %
Neutro Abs: 5 10*3/uL (ref 1.7–7.7)
Neutrophils Relative %: 54 %
Platelet Count: 269 10*3/uL (ref 150–400)
RBC: 4.68 MIL/uL (ref 4.22–5.81)
RDW: 13.5 % (ref 11.5–15.5)
WBC Count: 9.4 10*3/uL (ref 4.0–10.5)
nRBC: 0 % (ref 0.0–0.2)

## 2021-09-19 LAB — LACTATE DEHYDROGENASE: LDH: 136 U/L (ref 98–192)

## 2021-10-26 DIAGNOSIS — H524 Presbyopia: Secondary | ICD-10-CM | POA: Diagnosis not present

## 2021-11-27 DIAGNOSIS — U071 COVID-19: Secondary | ICD-10-CM | POA: Diagnosis not present

## 2021-12-16 ENCOUNTER — Other Ambulatory Visit: Payer: Self-pay | Admitting: *Deleted

## 2021-12-16 ENCOUNTER — Inpatient Hospital Stay: Payer: Medicare Other | Attending: Hematology and Oncology

## 2021-12-16 ENCOUNTER — Other Ambulatory Visit: Payer: Self-pay

## 2021-12-16 ENCOUNTER — Ambulatory Visit (HOSPITAL_COMMUNITY)
Admission: RE | Admit: 2021-12-16 | Discharge: 2021-12-16 | Disposition: A | Discharge status: Home / Self Care | Payer: Medicare Other | Source: Ambulatory Visit | Attending: Hematology and Oncology | Admitting: Hematology and Oncology

## 2021-12-16 DIAGNOSIS — Z923 Personal history of irradiation: Secondary | ICD-10-CM | POA: Insufficient documentation

## 2021-12-16 DIAGNOSIS — C8170 Other classical Hodgkin lymphoma, unspecified site: Secondary | ICD-10-CM

## 2021-12-16 DIAGNOSIS — J432 Centrilobular emphysema: Secondary | ICD-10-CM | POA: Diagnosis not present

## 2021-12-16 DIAGNOSIS — Z79899 Other long term (current) drug therapy: Secondary | ICD-10-CM | POA: Insufficient documentation

## 2021-12-16 DIAGNOSIS — I10 Essential (primary) hypertension: Secondary | ICD-10-CM | POA: Insufficient documentation

## 2021-12-16 DIAGNOSIS — I2510 Atherosclerotic heart disease of native coronary artery without angina pectoris: Secondary | ICD-10-CM | POA: Diagnosis not present

## 2021-12-16 DIAGNOSIS — Z8571 Personal history of Hodgkin lymphoma: Secondary | ICD-10-CM | POA: Insufficient documentation

## 2021-12-16 DIAGNOSIS — J8410 Pulmonary fibrosis, unspecified: Secondary | ICD-10-CM | POA: Diagnosis not present

## 2021-12-16 DIAGNOSIS — E041 Nontoxic single thyroid nodule: Secondary | ICD-10-CM | POA: Insufficient documentation

## 2021-12-16 DIAGNOSIS — C8590 Non-Hodgkin lymphoma, unspecified, unspecified site: Secondary | ICD-10-CM | POA: Diagnosis not present

## 2021-12-16 LAB — CMP (CANCER CENTER ONLY)
ALT: 19 U/L (ref 0–44)
AST: 18 U/L (ref 15–41)
Albumin: 4.3 g/dL (ref 3.5–5.0)
Alkaline Phosphatase: 91 U/L (ref 38–126)
Anion gap: 7 (ref 5–15)
BUN: 18 mg/dL (ref 8–23)
CO2: 26 mmol/L (ref 22–32)
Calcium: 10.2 mg/dL (ref 8.9–10.3)
Chloride: 106 mmol/L (ref 98–111)
Creatinine: 0.99 mg/dL (ref 0.61–1.24)
GFR, Estimated: >60 mL/min (ref >60)
Glucose, Bld: 113 mg/dL — ABNORMAL HIGH (ref 70–99)
Potassium: 3.9 mmol/L (ref 3.5–5.1)
Sodium: 139 mmol/L (ref 135–145)
Total Bilirubin: 0.4 mg/dL (ref 0.3–1.2)
Total Protein: 7.2 g/dL (ref 6.5–8.1)

## 2021-12-16 LAB — CBC WITH DIFFERENTIAL (CANCER CENTER ONLY)
Abs Immature Granulocytes: 0.03 10*3/uL (ref 0.00–0.07)
Basophils Absolute: 0.1 10*3/uL (ref 0.0–0.1)
Basophils Relative: 1 %
Eosinophils Absolute: 0.1 10*3/uL (ref 0.0–0.5)
Eosinophils Relative: 1 %
HCT: 41.6 % (ref 39.0–52.0)
Hemoglobin: 13.9 g/dL (ref 13.0–17.0)
Immature Granulocytes: 0 %
Lymphocytes Relative: 24 %
Lymphs Abs: 2.8 10*3/uL (ref 0.7–4.0)
MCH: 29 pg (ref 26.0–34.0)
MCHC: 33.4 g/dL (ref 30.0–36.0)
MCV: 86.7 fL (ref 80.0–100.0)
Monocytes Absolute: 1.2 10*3/uL — ABNORMAL HIGH (ref 0.1–1.0)
Monocytes Relative: 10 %
Neutro Abs: 7.2 10*3/uL (ref 1.7–7.7)
Neutrophils Relative %: 64 %
Platelet Count: 312 10*3/uL (ref 150–400)
RBC: 4.8 MIL/uL (ref 4.22–5.81)
RDW: 13.4 % (ref 11.5–15.5)
WBC Count: 11.4 10*3/uL — ABNORMAL HIGH (ref 4.0–10.5)
nRBC: 0 % (ref 0.0–0.2)

## 2021-12-16 MED ORDER — IOHEXOL 350 MG/ML SOLN
80.0000 mL | Freq: Once | INTRAVENOUS | Status: AC | PRN
  Administered 2021-12-16: 80 mL via INTRAVENOUS

## 2021-12-18 ENCOUNTER — Other Ambulatory Visit: Payer: Self-pay | Admitting: Hematology and Oncology

## 2021-12-18 DIAGNOSIS — C8170 Other classical Hodgkin lymphoma, unspecified site: Secondary | ICD-10-CM

## 2021-12-18 NOTE — Progress Notes (Signed)
[place] Telephone:([phone]) [phone]   [phone]  PROGRESS NOTE  Patient Care Team: [name], MD as PCP - General (Internal Medicine)  Hematological/Oncological History # Classical Hodgkin's Lymphoma, Stage II in Remission 1)10/10/2019: CT scan for right shoulder pain showed increased right axillary lymphadenopathy.  2) 12/10/2019: patient underwent US of the right axilla, noted to have right axillary lymphadenopathy 3) 12/15/2019: [place] axillary lymph node biopsy. Pathology confirms Classical Hodgkin's lymphoma 4) 12/24/2019: Establish care with Dr. Lorenso Courier 5)  12/31/2019: PET CT scan showed signs of right axillary adenopathy with increased metabolic activity 6) 08/12/3299: Cycle 1 Day 1 of AVD chemotherapy (Bleomycin to be held for the duration of treatment) 7) 01/29/2020: Cycle 1 Day 14 of AVD chemotherapy  8) 02/12/2020: Cycle 2 Day 1 of AVD chemotherapy  9) 02/26/2020: Cycle 2 Day 15 of AVD chemotherapy  10) 03/11/2020: Cycle 3 Day 1 of AVD chemotherapy  11) 03/25/2020: Cycle 3 Day 15 of AVD chemotherapy  12) 04/08/2020: Cycle 4 Day 1 of AVD chemotherapy  13) 04/22/2020: Cycle 4 Day 15 of AVD chemotherapy. Completion of Chemotherapy regimen 14) 06/16/2020: start of radiation therapy.  15) 07/08/2020: final radiation treatment.  16) 10/19/2020: Post treatment CT shows no evidence of residual or recurrent disease. 17) 06/14/2021: Post treatment CT shows no evidence of residual or recurrent disease. 18) 12/16/2021: CT scan shows no evidence of recurrent disease.   Interval History:  Marc Watson 66 y.o. male with medical history significant for classical Hodgkin Lymphoma who presents for a follow up visit. The patient's last visit was on 06/16/2021. Today the patient presents for his routine surveillance f/u.   On exam today Marc Watson notes he has been well in the interim since our last visit.  He notes that he has been eating quite a lot throughout the holiday season and has  gained some weight.  His energy levels are good and overall he is quite happy.  He is doing his best to try to exercise more and watch what he eats now for the new year has come around.  He did unfortunately have COVID the Sunday before Christmas but took medication for it and had very mild symptoms.  He notes he is not having any signs or symptoms of lymphadenopathy or recurrence and he has not had any new or concerning symptoms in the interim since our last visit.  He denies having issues with fevers, chills, sweats, nausea vomiting or diarrhea.  A full 10 point ROS is listed below.  MEDICAL HISTORY:  Past Medical History:  Diagnosis Date   Arthritis    COPD (chronic obstructive pulmonary disease) ([place])    per CXR on 11/11/2019   GERD (gastroesophageal reflux disease)    occ   hodgkins lymphoma 12/2019   Hypertension    recently started taking on 11/05/2019   ALLERGIES:  is allergic to no known allergies.  MEDICATIONS:  Current Outpatient Medications  Medication Sig Dispense Refill   cyclobenzaprine (FLEXERIL) 10 MG tablet Take 10 mg by mouth at bedtime as needed for muscle spasms.      diphenhydrAMINE (BENADRYL) 25 mg capsule Take 25 mg by mouth every 6 (six) hours as needed.     losartan (COZAAR) 50 MG tablet Take 50 mg by mouth daily.     nicotine (NICODERM CQ - DOSED IN MG/24 HOURS) 14 mg/24hr patch Apply 14mg  patch daily x 6 wk, then 7 mg patch daily x 2 wk 14 patch 2   nicotine (NICODERM  CQ - DOSED IN MG/24 HR) 7 mg/24hr patch Apply 14mg  patch daily x 6 wk, then 7 mg patch daily x 2 wk 14 patch 0   Omega-3 Fatty Acids (FISH OIL PO) Take 2 capsules by mouth daily. OMEGA XL PROPRIETARY BLEND 300 MG d-ALPHA TOCOPHEROL (VITAMIN E)/EXTRA VIRGIN OLIVE OIL/EXTRACT (pcso-524) CONTAINING OMEGA FATTY ACIDS, GREEN LIPPED MUSSEL (PERNA CANLICULUS) OIL)      No current facility-administered medications for this visit.   Facility-Administered Medications Ordered in Other Visits  Medication  Dose Route Frequency Provider Last Rate Last Admin   0.9 %  sodium chloride infusion   Intravenous Continuous Allred, Darrell K, PA-C        REVIEW OF SYSTEMS:   Constitutional: ( - ) fevers, ( - )  chills , ( - ) night sweats (+) itching Eyes: ( - ) blurriness of vision, ( - ) double vision, ( - ) watery eyes Ears, nose, mouth, throat, and face: ( - ) mucositis, ( - ) sore throat Respiratory: ( - ) cough, ( - ) dyspnea, ( - ) wheezes Cardiovascular: ( - ) palpitation, ( - ) chest discomfort, ( - ) lower extremity swelling Gastrointestinal:  ( - ) nausea, ( - ) heartburn, ( - ) change in bowel habits Skin: ( - ) abnormal skin rashes Lymphatics: ( - ) new lymphadenopathy, ( - ) easy bruising Neurological: ( - ) numbness, ( - ) tingling, ( - ) new weaknesses Behavioral/Psych: ( - ) mood change, ( - ) new changes  All other systems were reviewed with the patient and are negative.  PHYSICAL EXAMINATION: ECOG PERFORMANCE STATUS: 0 - Asymptomatic  Vitals:   12/19/21 1458  BP: (!) 150/91  Pulse: 73  Resp: 18  Temp: 97.9 F (36.6 C)  SpO2: 98%    Filed Weights   12/19/21 1458  Weight: 258 lb 1 oz (117.1 kg)     GENERAL: well appearing middle aged Caucasian male in NAD  SKIN: skin color, texture, turgor are normal, no rashes or significant. Lesions. Mild rash on arms bilaterally, consistent with contact dermatitis.  EYES: conjunctiva are pink and non-injected, sclera clear LUNGS: clear to auscultation and percussion with normal breathing effort HEART: regular rate & rhythm and no murmurs and no lower extremity edema Musculoskeletal: no cyanosis of digits and no clubbing  PSYCH: alert & oriented x 3, fluent speech NEURO: no focal motor/sensory deficits  LABORATORY DATA:  I have reviewed the data as listed CBC Latest Ref Rng & Units 12/16/2021 09/19/2021 06/10/2021  WBC 4.0 - 10.5 K/uL 11.4(H) 9.4 9.0  Hemoglobin 13.0 - 17.0 g/dL 13.9 13.9 14.6  Hematocrit 39.0 - 52.0 % 41.6 41.7  44.1  Platelets 150 - 400 K/uL 312 269 299    CMP Latest Ref Rng & Units 12/16/2021 09/19/2021 06/10/2021  Glucose 70 - 99 mg/dL 113(H) 83 107(H)  BUN 8 - 23 mg/dL 18 16 18   Creatinine 0.61 - 1.24 mg/dL 0.99 1.04 1.03  Sodium 135 - 145 mmol/L 139 141 141  Potassium 3.5 - 5.1 mmol/L 3.9 4.0 4.1  Chloride 98 - 111 mmol/L 106 108 109  CO2 22 - 32 mmol/L 26 24 25   Calcium 8.9 - 10.3 mg/dL 10.2 9.9 9.2  Total Protein 6.5 - 8.1 g/dL 7.2 6.8 7.2  Total Bilirubin 0.3 - 1.2 mg/dL 0.4 0.2(L) 0.4  Alkaline Phos 38 - 126 U/L 91 105 95  AST 15 - 41 U/L 18 13(L) 18  ALT 0 - 44  U/L 19 14 16      RADIOGRAPHIC STUDIES: I have personally reviewed the radiological images as listed and agreed with the findings in the report: no evidence of recurrent disease.  CT CHEST ABDOMEN PELVIS W CONTRAST  Result Date: 12/18/2021 CLINICAL DATA:  I since lymphoma.  Restaging. EXAM: CT CHEST, ABDOMEN, AND PELVIS WITH CONTRAST TECHNIQUE: Multidetector CT imaging of the chest, abdomen and pelvis was performed following the standard protocol during bolus administration of intravenous contrast. CONTRAST:  74mL OMNIPAQUE IOHEXOL 350 MG/ML SOLN COMPARISON:  06/14/2021 FINDINGS: CT CHEST FINDINGS Cardiovascular: The heart size is normal. No substantial pericardial effusion. Coronary artery calcification is evident. Mild atherosclerotic calcification is noted in the wall of the thoracic aorta. Mediastinum/Nodes: Stable left thyroid nodule. This has been evaluated on previous imaging. (ref: J Am Coll Radiol. 2015 Feb;12(2): 143-50).There is no hilar lymphadenopathy. The esophagus has normal imaging features. Stable tiny lymph node in the left thoracic inlet is identified previously. There is no axillary lymphadenopathy. Lungs/Pleura: Centrilobular and paraseptal emphysema evident. Calcified granuloma noted posterior right upper lobe. No suspicious pulmonary nodule or mass. No focal airspace consolidation. No pleural effusion.  Musculoskeletal: No worrisome lytic or sclerotic osseous abnormality. CT ABDOMEN PELVIS FINDINGS Hepatobiliary: No suspicious focal abnormality within the liver parenchyma. There is no evidence for gallstones, gallbladder wall thickening, or pericholecystic fluid. No intrahepatic or extrahepatic biliary dilation. Pancreas: No focal mass lesion. No dilatation of the main duct. No intraparenchymal cyst. No peripancreatic edema. Spleen: No splenomegaly. No focal mass lesion. Adrenals/Urinary Tract: Right adrenal gland unremarkable. 2.2 cm left adrenal nodule measured previously is stable today. Tiny hypodensities in both kidneys are too small to characterize but likely benign. No evidence for hydroureter. The urinary bladder appears normal for the degree of distention. Stomach/Bowel: Stomach is unremarkable. No gastric wall thickening. No evidence of outlet obstruction. Duodenum is normally positioned as is the ligament of Treitz. Duodenal diverticulum noted. No small bowel wall thickening. No small bowel dilatation. The terminal ileum is normal. The appendix is normal. No gross colonic mass. No colonic wall thickening. Mild left-sided diverticulosis without diverticulitis. Vascular/Lymphatic: There is mild atherosclerotic calcification of the abdominal aorta without aneurysm. There is no gastrohepatic or hepatoduodenal ligament lymphadenopathy. No retroperitoneal or mesenteric lymphadenopathy. No pelvic sidewall lymphadenopathy. No evidence for groin lymphadenopathy Reproductive: The prostate gland and seminal vesicles are unremarkable. Other: No intraperitoneal free fluid. Musculoskeletal: Status post left total hip replacement. Degenerative changes noted right hip. No worrisome lytic or sclerotic osseous abnormality. Small umbilical hernia contains only fat. IMPRESSION: 1. Stable exam. No new or progressive lymphadenopathy to suggest recurrent lymphoma. 2. Stable 2.2 cm left adrenal nodule, previously characterized  as adenoma. 3. Aortic Atherosclerosis (ICD10-I70.0) and Emphysema (ICD10-J43.9). Electronically Signed   By: Misty Stanley M.D.   On: 12/18/2021 07:19    ASSESSMENT & PLAN Marc Watson 66 y.o. male with medical history significant for classical Hodgkin Lymphoma in remission who presents for a follow up visit.  Overall Marc Watson has been well in the interim since our last visit.  His energy has been good and he has been gaining weight. Otherwise he has no questions or concerns at this time.  His most recent CT scan showed no evidence of residual recurrent disease.  The plan was for AVD chemotherapy x 4 total cycles followed by ISRT. After all treatments would reassess with PET CT scan (recommended within 3 months of completion of therapy). The chemotherapy regimen consisted of doxorubicin 25mg /m2, vinblastine 6mg /m2, and dacarbazine 375mg /m2  on Day 1 and Day 15. Bleomycin will be held on concern for his lung function and for his age. He started Cycle 1 Day 1 on 01/16/2020.  Follow-up PET CT scan on 03/08/2020 showed complete response to therapy.  As such she entered surveillance and is receiving CT scans every 6 months for the first 2 years and then yearly after that.  IPS Score For Hodgkin Lymphoma: 2 (81% OS, 67% freedom from progression)  # Classical Hodgkin's Lymphoma, Stage II (Nonbulky, Favorable) in Remission --completed AVD chemotherapy (with no bleomycin) and radiation therapy.   --restaging post Cycle 2 PET scan performed 03/08/2020, showed significant improvement, with complete resolution of the right axillary adenopathy (the larger previous dominant lymph nodes are currently no longer present compatible with Deauville 1 disease); reduced activity and size of lymph nodes in the neck, currently Deauville 2; and Deauville 2 activity in the AP window lymph node.Most recent CT performed approximately 12 weeks after completion of radiation therapy (July 2022), no evidence of residual disease or  lymph adenopathy.  --patient has undergone appropriate pre-treatment testing including TTE, port placement, and blood work. PFTs were not able to be performed due to issues with scheduling during the [place] pandemic. Bleomycin held due to age and concern for underlying lung disease --stable labs, rechecked today.  --appreciate assessment by radiation oncology for post chemotherapy RT to the affected lymph nodes. Therapy now completed.  --Per NCCN guidelines, will have clinic f/u q 3-6 months for 1-2 years followed by every 6-12 months until year 3, then annually. CT scan no more often than q 6 months for the first 2 years following therapy --plan to RTC in 6 months with repeat CT scan   #Thyroid Nodule, stable --found incidentally on last PET CT scan on 12/31/2019 --US performed, appears stable based on prior imaging.  --discussed with patient, we will proceed with biopsy at this time. --continue to monitor    Orders Placed This Encounter  Procedures   CT CHEST ABDOMEN PELVIS W CONTRAST    Standing Status:   Future    Standing Expiration Date:   12/19/2022    Order Specific Question:   Preferred imaging location?    Answer:   [facility]    Order Specific Question:   Is Oral Contrast requested for this exam?    Answer:   Yes, Per Radiology protocol   All questions were answered. The patient knows to call the clinic with any problems, questions or concerns.  A total of more than 30 minutes were spent on this encounter and over half of that time was spent on counseling and coordination of care as outlined above.   [name], MD Department of Hematology/Oncology [place] [facility] Phone: [phone] Pager: [phone] Email: [email or web address]  12/19/2021 4:01 PM

## 2021-12-19 ENCOUNTER — Other Ambulatory Visit: Payer: Medicare Other

## 2021-12-19 ENCOUNTER — Other Ambulatory Visit: Payer: Self-pay

## 2021-12-19 ENCOUNTER — Inpatient Hospital Stay: Payer: Medicare Other | Admitting: Hematology and Oncology

## 2021-12-19 VITALS — BP 150/91 | HR 73 | Temp 97.9°F | Resp 18 | Wt 258.1 lb

## 2021-12-19 DIAGNOSIS — Z923 Personal history of irradiation: Secondary | ICD-10-CM | POA: Diagnosis not present

## 2021-12-19 DIAGNOSIS — E041 Nontoxic single thyroid nodule: Secondary | ICD-10-CM | POA: Diagnosis not present

## 2021-12-19 DIAGNOSIS — I10 Essential (primary) hypertension: Secondary | ICD-10-CM | POA: Diagnosis not present

## 2021-12-19 DIAGNOSIS — C8170 Other classical Hodgkin lymphoma, unspecified site: Secondary | ICD-10-CM | POA: Diagnosis not present

## 2021-12-19 DIAGNOSIS — Z79899 Other long term (current) drug therapy: Secondary | ICD-10-CM | POA: Diagnosis not present

## 2021-12-19 DIAGNOSIS — Z8571 Personal history of Hodgkin lymphoma: Secondary | ICD-10-CM | POA: Diagnosis not present

## 2021-12-20 ENCOUNTER — Telehealth: Payer: Self-pay | Admitting: Hematology and Oncology

## 2021-12-20 NOTE — Telephone Encounter (Signed)
Scheduled per 1/9 los, message has been left with pt

## 2022-02-06 DIAGNOSIS — G4733 Obstructive sleep apnea (adult) (pediatric): Secondary | ICD-10-CM | POA: Diagnosis not present

## 2022-03-06 DIAGNOSIS — G4733 Obstructive sleep apnea (adult) (pediatric): Secondary | ICD-10-CM | POA: Diagnosis not present

## 2022-03-08 DIAGNOSIS — G4733 Obstructive sleep apnea (adult) (pediatric): Secondary | ICD-10-CM | POA: Diagnosis not present

## 2022-03-08 DIAGNOSIS — I10 Essential (primary) hypertension: Secondary | ICD-10-CM | POA: Diagnosis not present

## 2022-03-08 DIAGNOSIS — G4719 Other hypersomnia: Secondary | ICD-10-CM | POA: Diagnosis not present

## 2022-04-06 DIAGNOSIS — G4733 Obstructive sleep apnea (adult) (pediatric): Secondary | ICD-10-CM | POA: Diagnosis not present

## 2022-06-12 ENCOUNTER — Ambulatory Visit (HOSPITAL_COMMUNITY)
Admission: RE | Admit: 2022-06-12 | Discharge: 2022-06-12 | Disposition: A | Discharge status: Home / Self Care | Payer: Medicare Other | Source: Ambulatory Visit | Attending: Hematology and Oncology | Admitting: Hematology and Oncology

## 2022-06-12 DIAGNOSIS — Z8571 Personal history of Hodgkin lymphoma: Secondary | ICD-10-CM | POA: Diagnosis not present

## 2022-06-12 DIAGNOSIS — C8170 Other classical Hodgkin lymphoma, unspecified site: Secondary | ICD-10-CM | POA: Insufficient documentation

## 2022-06-12 DIAGNOSIS — I700 Atherosclerosis of aorta: Secondary | ICD-10-CM | POA: Diagnosis not present

## 2022-06-12 DIAGNOSIS — J8410 Pulmonary fibrosis, unspecified: Secondary | ICD-10-CM | POA: Diagnosis not present

## 2022-06-12 DIAGNOSIS — J439 Emphysema, unspecified: Secondary | ICD-10-CM | POA: Diagnosis not present

## 2022-06-12 MED ORDER — IOHEXOL 300 MG/ML  SOLN
100.0000 mL | Freq: Once | INTRAMUSCULAR | Status: AC | PRN
  Administered 2022-06-12: 100 mL via INTRAVENOUS

## 2022-06-14 ENCOUNTER — Telehealth: Payer: Self-pay | Admitting: Hematology and Oncology

## 2022-06-14 NOTE — Telephone Encounter (Signed)
.  Called patient to schedule appointment per 7/5 inbasket, patient is aware of date and time.   

## 2022-06-15 ENCOUNTER — Inpatient Hospital Stay: Payer: Medicare Other | Attending: Hematology and Oncology

## 2022-06-15 ENCOUNTER — Other Ambulatory Visit: Payer: Self-pay

## 2022-06-15 ENCOUNTER — Inpatient Hospital Stay (HOSPITAL_BASED_OUTPATIENT_CLINIC_OR_DEPARTMENT_OTHER): Payer: Medicare Other | Admitting: Hematology and Oncology

## 2022-06-15 VITALS — BP 135/76 | HR 80 | Temp 98.5°F | Resp 15 | Ht 73.0 in | Wt 247.5 lb

## 2022-06-15 DIAGNOSIS — C8170 Other classical Hodgkin lymphoma, unspecified site: Secondary | ICD-10-CM

## 2022-06-15 DIAGNOSIS — E041 Nontoxic single thyroid nodule: Secondary | ICD-10-CM | POA: Insufficient documentation

## 2022-06-15 DIAGNOSIS — I10 Essential (primary) hypertension: Secondary | ICD-10-CM | POA: Insufficient documentation

## 2022-06-15 DIAGNOSIS — Z8571 Personal history of Hodgkin lymphoma: Secondary | ICD-10-CM | POA: Diagnosis not present

## 2022-06-15 DIAGNOSIS — Z79899 Other long term (current) drug therapy: Secondary | ICD-10-CM | POA: Insufficient documentation

## 2022-06-15 DIAGNOSIS — Z923 Personal history of irradiation: Secondary | ICD-10-CM | POA: Insufficient documentation

## 2022-06-15 LAB — CBC WITH DIFFERENTIAL (CANCER CENTER ONLY)
Abs Immature Granulocytes: 0.04 10*3/uL (ref 0.00–0.07)
Basophils Absolute: 0.1 10*3/uL (ref 0.0–0.1)
Basophils Relative: 1 %
Eosinophils Absolute: 0.2 10*3/uL (ref 0.0–0.5)
Eosinophils Relative: 2 %
HCT: 40.5 % (ref 39.0–52.0)
Hemoglobin: 13.7 g/dL (ref 13.0–17.0)
Immature Granulocytes: 0 %
Lymphocytes Relative: 27 %
Lymphs Abs: 2.7 10*3/uL (ref 0.7–4.0)
MCH: 29.7 pg (ref 26.0–34.0)
MCHC: 33.8 g/dL (ref 30.0–36.0)
MCV: 87.7 fL (ref 80.0–100.0)
Monocytes Absolute: 0.7 10*3/uL (ref 0.1–1.0)
Monocytes Relative: 7 %
Neutro Abs: 6.2 10*3/uL (ref 1.7–7.7)
Neutrophils Relative %: 63 %
Platelet Count: 262 10*3/uL (ref 150–400)
RBC: 4.62 MIL/uL (ref 4.22–5.81)
RDW: 14.2 % (ref 11.5–15.5)
WBC Count: 10 10*3/uL (ref 4.0–10.5)
nRBC: 0 % (ref 0.0–0.2)

## 2022-06-15 LAB — CMP (CANCER CENTER ONLY)
ALT: 13 U/L (ref 0–44)
AST: 14 U/L — ABNORMAL LOW (ref 15–41)
Albumin: 4 g/dL (ref 3.5–5.0)
Alkaline Phosphatase: 111 U/L (ref 38–126)
Anion gap: 7 (ref 5–15)
BUN: 22 mg/dL (ref 8–23)
CO2: 26 mmol/L (ref 22–32)
Calcium: 9.7 mg/dL (ref 8.9–10.3)
Chloride: 110 mmol/L (ref 98–111)
Creatinine: 1.05 mg/dL (ref 0.61–1.24)
GFR, Estimated: >60 mL/min (ref >60)
Glucose, Bld: 120 mg/dL — ABNORMAL HIGH (ref 70–99)
Potassium: 3.7 mmol/L (ref 3.5–5.1)
Sodium: 143 mmol/L (ref 135–145)
Total Bilirubin: 0.4 mg/dL (ref 0.3–1.2)
Total Protein: 6.7 g/dL (ref 6.5–8.1)

## 2022-06-15 LAB — LACTATE DEHYDROGENASE: LDH: 137 U/L (ref 98–192)

## 2022-06-15 NOTE — Progress Notes (Signed)
Marc [phone]   [phone]  PROGRESS NOTE  Patient Care Team: [name], MD as PCP - General (Internal Medicine)  Hematological/Oncological History # Classical Hodgkin's Lymphoma, Stage II in Remission 1)10/10/2019: CT scan for right shoulder pain showed increased right axillary lymphadenopathy.  2) 12/10/2019: patient underwent US of the right axilla, noted to have right axillary lymphadenopathy 3) 12/15/2019: [place] axillary lymph node biopsy. Pathology confirms Classical Hodgkin's lymphoma 4) 12/24/2019: Establish care with Dr. Lorenso Courier 5)  12/31/2019: PET CT scan showed signs of right axillary adenopathy with increased metabolic activity 6) 07/11/8298: Cycle 1 Day 1 of AVD chemotherapy (Bleomycin to be held for the duration of treatment) 7) 01/29/2020: Cycle 1 Day 14 of AVD chemotherapy  8) 02/12/2020: Cycle 2 Day 1 of AVD chemotherapy  9) 02/26/2020: Cycle 2 Day 15 of AVD chemotherapy  10) 03/11/2020: Cycle 3 Day 1 of AVD chemotherapy  11) 03/25/2020: Cycle 3 Day 15 of AVD chemotherapy  12) 04/08/2020: Cycle 4 Day 1 of AVD chemotherapy  13) 04/22/2020: Cycle 4 Day 15 of AVD chemotherapy. Completion of Chemotherapy regimen 14) 06/16/2020: start of radiation therapy.  15) 07/08/2020: final radiation treatment.  16) 10/19/2020: Post treatment CT shows no evidence of residual or recurrent disease. 17) 06/14/2021: Post treatment CT shows no evidence of residual or recurrent disease. 18) 12/16/2021: CT scan shows no evidence of recurrent disease.  19) 06/12/2022: CT scan shows no evidence of recurrent disease.   Interval History:  Marc Watson 66 y.o. male with medical history significant for classical Hodgkin Lymphoma who presents for a follow up visit. The patient's last visit was on 12/16/2021. Today the patient presents for his routine surveillance f/u.   On exam today Marc Watson notes he has been well overall interim since her last visit.  He notes that he  has not had any recent respiratory infections though he has been having issues with pollen.  He does have sinus issues on occasion.  Unfortunately his father passed away in 30-Apr-2022 due to melanoma, but he also had Merkel cell and squamous cell carcinoma.  The patient reports that he is up-to-date on his colonoscopies though he does still occasionally have some easy bruising.  He reports that he is "100% recovered".  He notes that he does not have any issues with fatigue and his energy levels have been quite strong.  He does use a CPAP machine which he finds aggravating.  He has been using some Flexeril prescribed to him previously to help him sleep with this machine.  He notes that he will be reaching out to his primary care doctor to discuss this further.  He notes he has lost 11 pounds in the room since the last visit and this has been intentional.  His goal is to get down to 220 to 125 pounds.  He denies having issues with fevers, chills, sweats, nausea vomiting or diarrhea.  A full 10 point ROS is listed below.  MEDICAL HISTORY:  Past Medical History:  Diagnosis Date   Arthritis    COPD (chronic obstructive pulmonary disease) ([place])    per CXR on 11/11/2019   GERD (gastroesophageal reflux disease)    occ   hodgkins lymphoma 12/2019   Hypertension    recently started taking on 11/05/2019   ALLERGIES:  is allergic to no known allergies.  MEDICATIONS:  Current Outpatient Medications  Medication Sig Dispense Refill   cyclobenzaprine (FLEXERIL) 10 MG tablet Take 10 mg by mouth at  bedtime as needed for muscle spasms.      diphenhydrAMINE (BENADRYL) 25 mg capsule Take 25 mg by mouth every 6 (six) hours as needed.     losartan (COZAAR) 50 MG tablet Take 50 mg by mouth daily.     nicotine (NICODERM CQ - DOSED IN MG/24 HOURS) 14 mg/24hr patch Apply '14mg'$  patch daily x 6 wk, then 7 mg patch daily x 2 wk 14 patch 2   nicotine (NICODERM CQ - DOSED IN MG/24 HR) 7 mg/24hr patch Apply '14mg'$  patch daily x 6  wk, then 7 mg patch daily x 2 wk 14 patch 0   Omega-3 Fatty Acids (FISH OIL PO) Take 2 capsules by mouth daily. OMEGA XL PROPRIETARY BLEND 300 MG d-ALPHA TOCOPHEROL (VITAMIN E)/EXTRA VIRGIN OLIVE OIL/EXTRACT (pcso-524) CONTAINING OMEGA FATTY ACIDS, GREEN LIPPED MUSSEL (PERNA CANLICULUS) OIL)      No current facility-administered medications for this visit.   Facility-Administered Medications Ordered in Other Visits  Medication Dose Route Frequency Provider Last Rate Last Admin   0.9 %  sodium chloride infusion   Intravenous Continuous Allred, Darrell K, PA-C        REVIEW OF SYSTEMS:   Constitutional: ( - ) fevers, ( - )  chills , ( - ) night sweats (+) itching Eyes: ( - ) blurriness of vision, ( - ) double vision, ( - ) watery eyes Ears, nose, mouth, throat, and face: ( - ) mucositis, ( - ) sore throat Respiratory: ( - ) cough, ( - ) dyspnea, ( - ) wheezes Cardiovascular: ( - ) palpitation, ( - ) chest discomfort, ( - ) lower extremity swelling Gastrointestinal:  ( - ) nausea, ( - ) heartburn, ( - ) change in bowel habits Skin: ( - ) abnormal skin rashes Lymphatics: ( - ) new lymphadenopathy, ( - ) easy bruising Neurological: ( - ) numbness, ( - ) tingling, ( - ) new weaknesses Behavioral/Psych: ( - ) mood change, ( - ) new changes  All other systems were reviewed with the patient and are negative.  PHYSICAL EXAMINATION: ECOG PERFORMANCE STATUS: 0 - Asymptomatic  Vitals:   06/15/22 1414  BP: 135/76  Pulse: 80  Resp: 15  Temp: 98.5 F (36.9 C)  SpO2: 95%    Filed Weights   06/15/22 1414  Weight: 247 lb 8 oz (112.3 kg)     GENERAL: well appearing middle aged Caucasian male in NAD  SKIN: skin color, texture, turgor are normal, no rashes or significant lesions.  EYES: conjunctiva are pink and non-injected, sclera clear LUNGS: clear to auscultation and percussion with normal breathing effort HEART: regular rate & rhythm and no murmurs and no lower extremity  edema Musculoskeletal: no cyanosis of digits and no clubbing  PSYCH: alert & oriented x 3, fluent speech NEURO: no focal motor/sensory deficits  LABORATORY DATA:  I have reviewed the data as listed    Latest Ref Rng & Units 06/15/2022    1:42 PM 12/16/2021    3:05 PM 09/19/2021    2:11 PM  CBC  WBC 4.0 - 10.5 K/uL 10.0  11.4  9.4   Hemoglobin 13.0 - 17.0 g/dL 13.7  13.9  13.9   Hematocrit 39.0 - 52.0 % 40.5  41.6  41.7   Platelets 150 - 400 K/uL 262  312  269        Latest Ref Rng & Units 06/15/2022    1:42 PM 12/16/2021    3:05 PM 09/19/2021    2:11  PM  CMP  Glucose 70 - 99 mg/dL 120  113  83   BUN 8 - 23 mg/dL '22  18  16   '$ Creatinine 0.61 - 1.24 mg/dL 1.05  0.99  1.04   Sodium 135 - 145 mmol/L 143  139  141   Potassium 3.5 - 5.1 mmol/L 3.7  3.9  4.0   Chloride 98 - 111 mmol/L 110  106  108   CO2 22 - 32 mmol/L '26  26  24   '$ Calcium 8.9 - 10.3 mg/dL 9.7  10.2  9.9   Total Protein 6.5 - 8.1 g/dL 6.7  7.2  6.8   Total Bilirubin 0.3 - 1.2 mg/dL 0.4  0.4  0.2   Alkaline Phos 38 - 126 U/L 111  91  105   AST 15 - 41 U/L '14  18  13   '$ ALT 0 - 44 U/L '13  19  14      '$ RADIOGRAPHIC STUDIES: I have personally reviewed the radiological images as listed and agreed with the findings in the report: no evidence of recurrent disease.  CT CHEST ABDOMEN PELVIS W CONTRAST  Result Date: 06/13/2022 CLINICAL DATA:  History of Hodgkin's lymphoma, chemotherapy and radiation therapy complete. * Tracking Code: BO * EXAM: CT CHEST, ABDOMEN, AND PELVIS WITH CONTRAST TECHNIQUE: Multidetector CT imaging of the chest, abdomen and pelvis was performed following the standard protocol during bolus administration of intravenous contrast. RADIATION DOSE REDUCTION: This exam was performed according to the departmental dose-optimization program which includes automated exposure control, adjustment of the mA and/or kV according to patient size and/or use of iterative reconstruction technique. CONTRAST:  129m OMNIPAQUE  IOHEXOL 300 MG/ML  SOLN COMPARISON:  Multiple priors including most recent CT December 15, 2021 FINDINGS: CT CHEST FINDINGS Cardiovascular: Aortic atherosclerosis without aneurysmal dilation. No central pulmonary embolus on this nondedicated study. Normal size heart. No significant pericardial effusion/thickening. Mediastinum/Nodes: No supraclavicular adenopathy. Similar heterogeneous nodularity of the thyroid gland with a dominant left thyroid nodule measuring 13 mm, was previously evaluated with dedicated thyroid ultrasound on October 28, 2020 and FNA of a right-sided thyroid nodule on July 05, 2021. No pathologically enlarged mediastinal, hilar or axillary lymph nodes. The esophagus is grossly unremarkable. Lungs/Pleura: There is a single new small solid pulmonary nodule in each of the bilateral upper lobes measuring 6 mm on the left on image 29/4 and 4 mm on the right on image 28/4. Bibasilar atelectasis versus scarring. Bilateral upper lobe calcified granulomata. No pleural effusion. No pneumothorax. Musculoskeletal: No chest wall mass or suspicious bone lesions identified. Thoracic spondylosis. CT ABDOMEN PELVIS FINDINGS Hepatobiliary: No suspicious hepatic lesion. Gallbladder is unremarkable. No biliary ductal dilation. Pancreas: No pancreatic ductal dilation or evidence of acute inflammation. Spleen: No splenomegaly or focal splenic lesion. Adrenals/Urinary Tract: Stable 2.3 cm left adrenal nodule previously characterized as a benign adrenal adenoma requiring no independent imaging follow-up. Right adrenal gland appears normal. No hydronephrosis. Stable exophytic 16 mm hypodense left renal cyst and additional subcentimeter hypodense renal lesions which are technically too small to accurately characterize but statistically likely reflect cysts, which in the absence of clinically indicated signs/symptoms require no independent imaging follow-up. Urinary bladder is nondistended limiting evaluation.  Stomach/Bowel: No radiopaque enteric contrast material was administered. Stomach is unremarkable for degree of distension. Periampullary duodenal diverticulum. No pathologic dilation of small or large bowel. The appendix and terminal ileum appear normal. No evidence of acute bowel inflammation. Left-sided colonic diverticulosis without findings of acute diverticulitis. Vascular/Lymphatic:  Aortic atherosclerosis without abdominal aortic aneurysm. No pathologically enlarged abdominal or pelvic lymph nodes. Reproductive: Dystrophic prostatic calcifications. Other: No significant abdominopelvic free fluid. Musculoskeletal: No aggressive lytic or blastic lesion of bone. Prior left total hip arthroplasty. Degenerative change of the right hip. Multilevel degenerative changes spine. IMPRESSION: 1. New solid subpleural pulmonary nodules in the bilateral upper lobes measure up to, most likely reflecting a post infectious/inflammatory etiology but technically nonspecific. Non-contrast chest CT at 3-6 months is recommended. If the nodules are stable at time of repeat CT, then future CT at 18-24 months (from today's scan) is considered optional for low-risk patients, but is recommended for high-risk patients. This recommendation follows the consensus statement: Guidelines for Management of Incidental Pulmonary Nodules Detected on CT Images: From the Fleischner Society 2017; Radiology 2017; 284:228-243. 2. No new or progressive lymphadenopathy above or below the diaphragm and no splenomegaly to suggest recurrent lymphoma. 3. Stable 2.3 cm left adrenal nodule previously characterized as a benign adrenal adenoma requiring no independent imaging follow-up. 4. Aortic Atherosclerosis (ICD10-I70.0) and Emphysema (ICD10-J43.9). Electronically Signed   By: Dahlia Bailiff M.D.   On: 06/13/2022 11:19    ASSESSMENT & PLAN Marc Watson 65 y.o. male with medical history significant for classical Hodgkin Lymphoma in remission who  presents for a follow up visit.  Overall Marc Watson has been well in the interim since our last visit.  His energy has been good and he has been gaining weight. Otherwise he has no questions or concerns at this time.  His most recent CT scan showed no evidence of residual recurrent disease.  The plan was for AVD chemotherapy x 4 total cycles followed by ISRT. After all treatments would reassess with PET CT scan (recommended within 3 months of completion of therapy). The chemotherapy regimen consisted of doxorubicin '25mg'$ /m2, vinblastine '6mg'$ /m2, and dacarbazine '375mg'$ /m2 on Day 1 and Day 15. Bleomycin will be held on concern for his lung function and for his age. He started Cycle 1 Day 1 on 01/16/2020.  Follow-up PET CT scan on 03/08/2020 showed complete response to therapy.  As such she entered surveillance and is receiving CT scans every 6 months for the first 2 years and then yearly after that.  IPS Score For Hodgkin Lymphoma: 2 (81% OS, 67% freedom from progression)  # Classical Hodgkin's Lymphoma, Stage II (Nonbulky, Favorable) in Remission --completed AVD chemotherapy (with no bleomycin) and radiation therapy.   --restaging post Cycle 2 PET scan performed 03/08/2020, showed significant improvement, with complete resolution of the right axillary adenopathy (the larger previous dominant lymph nodes are currently no longer present compatible with Deauville 1 disease); reduced activity and size of lymph nodes in the neck, currently Deauville 2; and Deauville 2 activity in the AP window lymph node.Most recent CT performed approximately 12 weeks after completion of radiation therapy (July 2022), no evidence of residual disease or lymph adenopathy.  --patient has undergone appropriate pre-treatment testing including TTE, port placement, and blood work. PFTs were not able to be performed due to issues with scheduling during the [place] pandemic. Bleomycin held due to age and concern for underlying lung  disease --appreciate assessment by radiation oncology for post chemotherapy RT to the affected lymph nodes. Therapy now completed.  Plan:  --Per NCCN guidelines, will have clinic f/u q 3-6 months for 1-2 years followed by every 6-12 months until year 3, then annually. CT scan no more often than q 6 months for the first 2 years following therapy --Labs today show white  blood cell count 10.0, hemoglobin 30.7, MCV 87.7, and platelets of 262. --plan to RTC in 6 months with repeat CT scan (earlier CT scan due to concern for pulmonary lesions).   #Thyroid Nodule, stable --found incidentally on last PET CT scan on 12/31/2019 --US performed, appears stable based on prior imaging.  -- Biopsy showed a Bethesda category 1 lesion (non-diagnostic) --continue to monitor, can address repeat biopsy if persistent.    No orders of the defined types were placed in this encounter.  All questions were answered. The patient knows to call the clinic with any problems, questions or concerns.  A total of more than 30 minutes were spent on this encounter and over half of that time was spent on counseling and coordination of care as outlined above.   [name], MD Department of Hematology/Oncology Marc Watson Phone: [phone] Pager: [phone] Email: [email or web address]  06/15/2022 5:13 PM

## 2022-07-18 DIAGNOSIS — E782 Mixed hyperlipidemia: Secondary | ICD-10-CM | POA: Diagnosis not present

## 2022-07-18 DIAGNOSIS — R7303 Prediabetes: Secondary | ICD-10-CM | POA: Diagnosis not present

## 2022-07-18 DIAGNOSIS — Z125 Encounter for screening for malignant neoplasm of prostate: Secondary | ICD-10-CM | POA: Diagnosis not present

## 2022-07-24 DIAGNOSIS — C8190 Hodgkin lymphoma, unspecified, unspecified site: Secondary | ICD-10-CM | POA: Diagnosis not present

## 2022-07-24 DIAGNOSIS — E782 Mixed hyperlipidemia: Secondary | ICD-10-CM | POA: Diagnosis not present

## 2022-07-24 DIAGNOSIS — Z0000 Encounter for general adult medical examination without abnormal findings: Secondary | ICD-10-CM | POA: Diagnosis not present

## 2022-07-24 DIAGNOSIS — I10 Essential (primary) hypertension: Secondary | ICD-10-CM | POA: Diagnosis not present

## 2022-07-26 DIAGNOSIS — H524 Presbyopia: Secondary | ICD-10-CM | POA: Diagnosis not present

## 2022-12-12 ENCOUNTER — Encounter: Payer: Self-pay | Admitting: Hematology and Oncology

## 2022-12-14 ENCOUNTER — Ambulatory Visit (HOSPITAL_COMMUNITY)
Admission: RE | Admit: 2022-12-14 | Discharge: 2022-12-14 | Disposition: A | Discharge status: Home / Self Care | Payer: Medicare Other | Source: Ambulatory Visit | Attending: Hematology and Oncology | Admitting: Hematology and Oncology

## 2022-12-14 ENCOUNTER — Encounter (HOSPITAL_COMMUNITY): Payer: Self-pay

## 2022-12-14 DIAGNOSIS — C8170 Other classical Hodgkin lymphoma, unspecified site: Secondary | ICD-10-CM | POA: Insufficient documentation

## 2022-12-14 DIAGNOSIS — Z8572 Personal history of non-Hodgkin lymphomas: Secondary | ICD-10-CM | POA: Diagnosis not present

## 2022-12-14 DIAGNOSIS — C8190 Hodgkin lymphoma, unspecified, unspecified site: Secondary | ICD-10-CM | POA: Diagnosis not present

## 2022-12-14 DIAGNOSIS — J439 Emphysema, unspecified: Secondary | ICD-10-CM | POA: Diagnosis not present

## 2022-12-14 DIAGNOSIS — K5730 Diverticulosis of large intestine without perforation or abscess without bleeding: Secondary | ICD-10-CM | POA: Diagnosis not present

## 2022-12-14 DIAGNOSIS — I700 Atherosclerosis of aorta: Secondary | ICD-10-CM | POA: Diagnosis not present

## 2022-12-14 DIAGNOSIS — N281 Cyst of kidney, acquired: Secondary | ICD-10-CM | POA: Diagnosis not present

## 2022-12-14 MED ORDER — IOHEXOL 300 MG/ML  SOLN
100.0000 mL | Freq: Once | INTRAMUSCULAR | Status: AC | PRN
  Administered 2022-12-14: 100 mL via INTRAVENOUS

## 2022-12-14 MED ORDER — IOHEXOL 9 MG/ML PO SOLN: 500.0000 mL | ORAL | Status: AC

## 2022-12-14 MED ORDER — IOHEXOL 9 MG/ML PO SOLN
ORAL | Status: AC
  Filled 2022-12-14: qty 500

## 2022-12-18 ENCOUNTER — Other Ambulatory Visit: Payer: Self-pay

## 2022-12-18 ENCOUNTER — Inpatient Hospital Stay: Payer: Medicare Other | Admitting: Hematology and Oncology

## 2022-12-18 ENCOUNTER — Other Ambulatory Visit: Payer: Self-pay | Admitting: Hematology and Oncology

## 2022-12-18 ENCOUNTER — Inpatient Hospital Stay: Payer: Medicare Other | Attending: Hematology and Oncology

## 2022-12-18 VITALS — BP 167/81 | HR 70 | Temp 98.0°F | Resp 15 | Wt 248.9 lb

## 2022-12-18 DIAGNOSIS — E041 Nontoxic single thyroid nodule: Secondary | ICD-10-CM | POA: Insufficient documentation

## 2022-12-18 DIAGNOSIS — I10 Essential (primary) hypertension: Secondary | ICD-10-CM | POA: Insufficient documentation

## 2022-12-18 DIAGNOSIS — Z79899 Other long term (current) drug therapy: Secondary | ICD-10-CM | POA: Insufficient documentation

## 2022-12-18 DIAGNOSIS — C8170 Other classical Hodgkin lymphoma, unspecified site: Secondary | ICD-10-CM

## 2022-12-18 DIAGNOSIS — Z923 Personal history of irradiation: Secondary | ICD-10-CM | POA: Insufficient documentation

## 2022-12-18 DIAGNOSIS — Z8571 Personal history of Hodgkin lymphoma: Secondary | ICD-10-CM | POA: Diagnosis not present

## 2022-12-18 LAB — CMP (CANCER CENTER ONLY)
ALT: 12 U/L (ref 0–44)
AST: 15 U/L (ref 15–41)
Albumin: 4.2 g/dL (ref 3.5–5.0)
Alkaline Phosphatase: 104 U/L (ref 38–126)
Anion gap: 6 (ref 5–15)
BUN: 15 mg/dL (ref 8–23)
CO2: 26 mmol/L (ref 22–32)
Calcium: 9.9 mg/dL (ref 8.9–10.3)
Chloride: 108 mmol/L (ref 98–111)
Creatinine: 1.06 mg/dL (ref 0.61–1.24)
GFR, Estimated: >60 mL/min (ref >60)
Glucose, Bld: 97 mg/dL (ref 70–99)
Potassium: 3.7 mmol/L (ref 3.5–5.1)
Sodium: 140 mmol/L (ref 135–145)
Total Bilirubin: 0.4 mg/dL (ref 0.3–1.2)
Total Protein: 6.7 g/dL (ref 6.5–8.1)

## 2022-12-18 LAB — CBC WITH DIFFERENTIAL (CANCER CENTER ONLY)
Abs Immature Granulocytes: 0.02 10*3/uL (ref 0.00–0.07)
Basophils Absolute: 0.1 10*3/uL (ref 0.0–0.1)
Basophils Relative: 1 %
Eosinophils Absolute: 0.1 10*3/uL (ref 0.0–0.5)
Eosinophils Relative: 1 %
HCT: 44.9 % (ref 39.0–52.0)
Hemoglobin: 15.2 g/dL (ref 13.0–17.0)
Immature Granulocytes: 0 %
Lymphocytes Relative: 35 %
Lymphs Abs: 2.9 10*3/uL (ref 0.7–4.0)
MCH: 30.1 pg (ref 26.0–34.0)
MCHC: 33.9 g/dL (ref 30.0–36.0)
MCV: 88.9 fL (ref 80.0–100.0)
Monocytes Absolute: 0.7 10*3/uL (ref 0.1–1.0)
Monocytes Relative: 8 %
Neutro Abs: 4.6 10*3/uL (ref 1.7–7.7)
Neutrophils Relative %: 55 %
Platelet Count: 326 10*3/uL (ref 150–400)
RBC: 5.05 MIL/uL (ref 4.22–5.81)
RDW: 13.8 % (ref 11.5–15.5)
WBC Count: 8.3 10*3/uL (ref 4.0–10.5)
nRBC: 0 % (ref 0.0–0.2)

## 2022-12-18 LAB — LACTATE DEHYDROGENASE: LDH: 133 U/L (ref 98–192)

## 2022-12-18 NOTE — Progress Notes (Signed)
[facility] [phone]   [phone]  PROGRESS NOTE  Patient Care Team: [name], MD as PCP - General (Internal Medicine)  Hematological/Oncological History # Classical Hodgkin's Lymphoma, Stage II in Remission 1)10/10/2019: CT scan for right shoulder pain showed increased right axillary lymphadenopathy.  2) 12/10/2019: patient underwent US of the right axilla, noted to have right axillary lymphadenopathy 3) 12/15/2019: [place] axillary lymph node biopsy. Pathology confirms Classical Hodgkin's lymphoma 4) 12/24/2019: Establish care with Dr. Lorenso Courier 5)  12/31/2019: PET CT scan showed signs of right axillary adenopathy with increased metabolic activity 6) 05/13/9527: Cycle 1 Day 1 of AVD chemotherapy (Bleomycin to be held for the duration of treatment) 7) 01/29/2020: Cycle 1 Day 14 of AVD chemotherapy  8) 02/12/2020: Cycle 2 Day 1 of AVD chemotherapy  9) 02/26/2020: Cycle 2 Day 15 of AVD chemotherapy  10) 03/11/2020: Cycle 3 Day 1 of AVD chemotherapy  11) 03/25/2020: Cycle 3 Day 15 of AVD chemotherapy  12) 04/08/2020: Cycle 4 Day 1 of AVD chemotherapy  13) 04/22/2020: Cycle 4 Day 15 of AVD chemotherapy. Completion of Chemotherapy regimen 14) 06/16/2020: start of radiation therapy.  15) 07/08/2020: final radiation treatment.  16) 10/19/2020: Post treatment CT shows no evidence of residual or recurrent disease. 17) 06/14/2021: Post treatment CT shows no evidence of residual or recurrent disease. 18) 12/16/2021: CT scan shows no evidence of recurrent disease.  19) 06/12/2022: CT scan shows no evidence of recurrent disease.  20) 12/14/2022: CT scan shows no evidence of recurrent disease.   Interval History:  Marc Watson 67 y.o. male with medical history significant for classical Hodgkin Lymphoma who presents for a follow up visit. The patient's last visit was on 06/15/2022. Today the patient presents for his routine surveillance f/u.   On exam today Marc Watson notes he has been  well overall only and since her last visit.  He reports his health has been stable and there is been "nothing out of the ordinary".  He notes no new medications or diagnoses.  His energy has been good though he does note that he is suffering from "old age".  He notes his appetite is been strong.  His goal is to get his weight down to 220 pounds and he has not made much progress in the interim since her last visit.  He also has been eating beet chewables to help bolster his blood counts.  He denies having issues with fevers, chills, sweats, nausea vomiting or diarrhea.  A full 10 point ROS is listed below.  MEDICAL HISTORY:  Past Medical History:  Diagnosis Date   Arthritis    COPD (chronic obstructive pulmonary disease) ([place])    per CXR on 11/11/2019   GERD (gastroesophageal reflux disease)    occ   hodgkins lymphoma 12/2019   Hypertension    recently started taking on 11/05/2019   ALLERGIES:  is allergic to no known allergies.  MEDICATIONS:  Current Outpatient Medications  Medication Sig Dispense Refill   cyclobenzaprine (FLEXERIL) 10 MG tablet Take 10 mg by mouth at bedtime as needed for muscle spasms.      diphenhydrAMINE (BENADRYL) 25 mg capsule Take 25 mg by mouth every 6 (six) hours as needed.     losartan (COZAAR) 50 MG tablet Take 50 mg by mouth daily.     naproxen sodium (ALEVE) 220 MG tablet Take 220 mg by mouth daily as needed.     nicotine (NICODERM CQ - DOSED IN MG/24 HOURS) 14 mg/24hr patch  Apply '14mg'$  patch daily x 6 wk, then 7 mg patch daily x 2 wk 14 patch 2   nicotine (NICODERM CQ - DOSED IN MG/24 HR) 7 mg/24hr patch Apply '14mg'$  patch daily x 6 wk, then 7 mg patch daily x 2 wk 14 patch 0   Omega-3 Fatty Acids (FISH OIL PO) Take 2 capsules by mouth daily. OMEGA XL PROPRIETARY BLEND 300 MG d-ALPHA TOCOPHEROL (VITAMIN E)/EXTRA VIRGIN OLIVE OIL/EXTRACT (pcso-524) CONTAINING OMEGA FATTY ACIDS, GREEN LIPPED MUSSEL (PERNA CANLICULUS) OIL)      No current facility-administered  medications for this visit.   Facility-Administered Medications Ordered in Other Visits  Medication Dose Route Frequency Provider Last Rate Last Admin   0.9 %  sodium chloride infusion   Intravenous Continuous Allred, Darrell K, PA-C        REVIEW OF SYSTEMS:   Constitutional: ( - ) fevers, ( - )  chills , ( - ) night sweats (+) itching Eyes: ( - ) blurriness of vision, ( - ) double vision, ( - ) watery eyes Ears, nose, mouth, throat, and face: ( - ) mucositis, ( - ) sore throat Respiratory: ( - ) cough, ( - ) dyspnea, ( - ) wheezes Cardiovascular: ( - ) palpitation, ( - ) chest discomfort, ( - ) lower extremity swelling Gastrointestinal:  ( - ) nausea, ( - ) heartburn, ( - ) change in bowel habits Skin: ( - ) abnormal skin rashes Lymphatics: ( - ) new lymphadenopathy, ( - ) easy bruising Neurological: ( - ) numbness, ( - ) tingling, ( - ) new weaknesses Behavioral/Psych: ( - ) mood change, ( - ) new changes  All other systems were reviewed with the patient and are negative.  PHYSICAL EXAMINATION: ECOG PERFORMANCE STATUS: 0 - Asymptomatic  Vitals:   12/18/22 1014  BP: (!) 167/81  Pulse: 70  Resp: 15  Temp: 98 F (36.7 C)  SpO2: 97%    Filed Weights   12/18/22 1014  Weight: 248 lb 14.4 oz (112.9 kg)     GENERAL: well appearing middle aged Caucasian male in NAD  SKIN: skin color, texture, turgor are normal, no rashes or significant lesions.  EYES: conjunctiva are pink and non-injected, sclera clear LUNGS: clear to auscultation and percussion with normal breathing effort HEART: regular rate & rhythm and no murmurs and no lower extremity edema Musculoskeletal: no cyanosis of digits and no clubbing  PSYCH: alert & oriented x 3, fluent speech NEURO: no focal motor/sensory deficits  LABORATORY DATA:  I have reviewed the data as listed    Latest Ref Rng & Units 12/18/2022    9:43 AM 06/15/2022    1:42 PM 12/16/2021    3:05 PM  CBC  WBC 4.0 - 10.5 K/uL 8.3  10.0  11.4    Hemoglobin 13.0 - 17.0 g/dL 15.2  13.7  13.9   Hematocrit 39.0 - 52.0 % 44.9  40.5  41.6   Platelets 150 - 400 K/uL 326  262  312        Latest Ref Rng & Units 12/18/2022    9:43 AM 06/15/2022    1:42 PM 12/16/2021    3:05 PM  CMP  Glucose 70 - 99 mg/dL 97  120  113   BUN 8 - 23 mg/dL '15  22  18   '$ Creatinine 0.61 - 1.24 mg/dL 1.06  1.05  0.99   Sodium 135 - 145 mmol/L 140  143  139   Potassium 3.5 - 5.1  mmol/L 3.7  3.7  3.9   Chloride 98 - 111 mmol/L 108  110  106   CO2 22 - 32 mmol/L '26  26  26   '$ Calcium 8.9 - 10.3 mg/dL 9.9  9.7  10.2   Total Protein 6.5 - 8.1 g/dL 6.7  6.7  7.2   Total Bilirubin 0.3 - 1.2 mg/dL 0.4  0.4  0.4   Alkaline Phos 38 - 126 U/L 104  111  91   AST 15 - 41 U/L '15  14  18   '$ ALT 0 - 44 U/L '12  13  19      '$ RADIOGRAPHIC STUDIES: I have personally reviewed the radiological images as listed and agreed with the findings in the report: no evidence of recurrent disease.  CT CHEST ABDOMEN PELVIS W CONTRAST  Result Date: 12/14/2022 CLINICAL DATA:  History of Hodgkin's lymphoma, follow-up. * Tracking Code: BO * EXAM: CT CHEST, ABDOMEN, AND PELVIS WITH CONTRAST TECHNIQUE: Multidetector CT imaging of the chest, abdomen and pelvis was performed following the standard protocol during bolus administration of intravenous contrast. RADIATION DOSE REDUCTION: This exam was performed according to the departmental dose-optimization program which includes automated exposure control, adjustment of the mA and/or kV according to patient size and/or use of iterative reconstruction technique. CONTRAST:  168m OMNIPAQUE IOHEXOL 300 MG/ML  SOLN COMPARISON:  Multiple priors including most recent CT June 12, 2022. FINDINGS: CT CHEST FINDINGS Cardiovascular: Normal caliber thoracic aorta. Aortic atherosclerosis. No central pulmonary embolus on this nondedicated study. Coronary artery calcifications. Normal size heart. No significant pericardial effusion/thickening. Mediastinum/Nodes: No  pathologically enlarged supraclavicular lymph nodes. Similar heterogeneous nodularity of the thyroid gland previously evaluated with dedicated thyroid ultrasound on October 28, 2020 and FNA of a right-sided nodule on July 05, 2021. Unchanged size of the mildly enlarged AP window lymph node measuring 12 mm in short axis on image 25/2 stable dating back to at least October 19, 2020. No new or enlarging mediastinal or hilar lymph nodes. Esophagus is grossly unremarkable. Lungs/Pleura: Short linear bands of stranding in the left upper lobe in the place of the previously identified upper lobe pulmonary nodules. No new suspicious pulmonary nodules or masses. Mild emphysema. Biapical pleuroparenchymal scarring. No pleural effusion. No pneumothorax. Musculoskeletal: No aggressive lytic or blastic lesion of bone. Multilevel degenerative changes spine. Right shoulder arthroplasty. CT ABDOMEN PELVIS FINDINGS Hepatobiliary: No suspicious hepatic lesion. Gallbladder is unremarkable. No biliary ductal dilation. Pancreas: No pancreatic ductal dilation or evidence of acute inflammation. Spleen: No splenomegaly or focal splenic lesion. Adrenals/Urinary Tract: Stable 18 mm left adrenal nodule previously characterized as a benign adenoma and requiring no independent imaging follow-up. Right adrenal gland appears normal. No hydronephrosis. Kidneys demonstrate symmetric enhancement. Stable benign left renal cysts and hypodense renal lesions technically too small to accurately characterize but statistically likely reflect cysts considered benign and requiring no independent imaging follow-up. Urinary bladder is unremarkable for degree of distension. Stomach/Bowel: Radiopaque enteric contrast material traverses the splenic flexure. Stomach is unremarkable for degree of distension. Periampullary duodenal diverticulum. No pathologic dilation of small or large bowel. The appendix and terminal ileum appear normal. Scattered left-sided  colonic diverticulosis without findings of acute diverticulitis. Vascular/Lymphatic: Aortic atherosclerosis. Smooth IVC contours. Patent portal vein. No pathologically enlarged abdominal or pelvic lymph nodes. Reproductive: Prostate is unremarkable. Other: No significant abdominopelvic free fluid. Musculoskeletal: No aggressive lytic or blastic lesion of bone. Multilevel degenerative changes spine. Left total hip arthroplasty. Degenerative change of the right hip. IMPRESSION: 1. No new  or progressive lymphadenopathy above or below the diaphragm and no splenomegaly to suggest recurrent lymphoma. 2. Colonic diverticulosis without findings of acute diverticulitis. 3. Aortic Atherosclerosis (ICD10-I70.0) and Emphysema (ICD10-J43.9). Electronically Signed   By: Dahlia Bailiff M.D.   On: 12/14/2022 17:17    ASSESSMENT & PLAN Marc Watson 67 y.o. male with medical history significant for classical Hodgkin Lymphoma in remission who presents for a follow up visit.  Overall Marc Watson has been well in the interim since our last visit.  His energy has been good and his weight has been stable. His most recent CT scan showed no evidence of residual recurrent disease.  The plan was for AVD chemotherapy x 4 total cycles followed by ISRT. After all treatments would reassess with PET CT scan (recommended within 3 months of completion of therapy). The chemotherapy regimen consisted of doxorubicin '25mg'$ /m2, vinblastine '6mg'$ /m2, and dacarbazine '375mg'$ /m2 on Day 1 and Day 15. Bleomycin will be held on concern for his lung function and for his age. He started Cycle 1 Day 1 on 01/16/2020.  Follow-up PET CT scan on 03/08/2020 showed complete response to therapy.  As such he entered surveillance and is receiving CT scans every 6 months for the first 2 years and then yearly after that.  IPS Score For Hodgkin Lymphoma: 2 (81% OS, 67% freedom from progression)  # Classical Hodgkin's Lymphoma, Stage II (Nonbulky, Favorable) in  Remission --completed AVD chemotherapy (with no bleomycin) and radiation therapy.   --restaging post Cycle 2 PET scan performed 03/08/2020, showed significant improvement, with complete resolution of the right axillary adenopathy (the larger previous dominant lymph nodes are currently no longer present compatible with Deauville 1 disease); reduced activity and size of lymph nodes in the neck, currently Deauville 2; and Deauville 2 activity in the AP window lymph node.Most recent CT performed approximately 12 weeks after completion of radiation therapy (July 2022), no evidence of residual disease or lymph adenopathy.  --patient has undergone appropriate pre-treatment testing including TTE, port placement, and blood work. PFTs were not able to be performed due to issues with scheduling during the [place] pandemic. Bleomycin held due to age and concern for underlying lung disease --appreciate assessment by radiation oncology for post chemotherapy RT to the affected lymph nodes. Therapy now completed.  Plan:  --Per NCCN guidelines, will have clinic f/u q 3-6 months for 1-2 years followed by every 6-12 months until year 3, then annually. CT scan no more often than q 6 months for the first 2 years following therapy. CT scans only as clinically indicated moving forward.  --Labs today show white blood cell count 8.3, hemoglobin 15.2, MCV 88.9, and platelets of 326. --plan to RTC in 12 months per patient request.   #Thyroid Nodule, stable --found incidentally on last PET CT scan on 12/31/2019 --US performed, appears stable based on prior imaging.  -- Biopsy showed a Bet[place]da category 1 lesion (non-diagnostic) --continue to monitor, can address repeat biopsy if persistent.   No orders of the defined types were placed in this encounter.  All questions were answered. The patient knows to call the clinic with any problems, questions or concerns.  A total of more than 30 minutes were spent on this encounter and  over half of that time was spent on counseling and coordination of care as outlined above.   [name], MD Department of Hematology/Oncology [place] [facility] Phone: [phone] Pager: [phone] Email: [email or web address]  12/18/2022 11:20 AM

## 2023-01-08 ENCOUNTER — Telehealth: Payer: Self-pay | Admitting: Hematology and Oncology

## 2023-01-08 NOTE — Telephone Encounter (Signed)
Called patient per 1/8 los notes. Patient scheduled and notified.

## 2023-03-16 DIAGNOSIS — H524 Presbyopia: Secondary | ICD-10-CM | POA: Diagnosis not present

## 2023-04-10 ENCOUNTER — Encounter: Payer: Self-pay | Admitting: Hematology and Oncology

## 2023-04-19 DIAGNOSIS — G4733 Obstructive sleep apnea (adult) (pediatric): Secondary | ICD-10-CM | POA: Diagnosis not present

## 2023-05-08 DIAGNOSIS — K080 Exfoliation of teeth due to systemic causes: Secondary | ICD-10-CM | POA: Diagnosis not present

## 2023-07-02 DIAGNOSIS — H524 Presbyopia: Secondary | ICD-10-CM | POA: Diagnosis not present

## 2023-07-20 DIAGNOSIS — G4733 Obstructive sleep apnea (adult) (pediatric): Secondary | ICD-10-CM | POA: Diagnosis not present

## 2023-07-23 DIAGNOSIS — R7303 Prediabetes: Secondary | ICD-10-CM | POA: Diagnosis not present

## 2023-07-23 DIAGNOSIS — Z0000 Encounter for general adult medical examination without abnormal findings: Secondary | ICD-10-CM | POA: Diagnosis not present

## 2023-07-23 DIAGNOSIS — I10 Essential (primary) hypertension: Secondary | ICD-10-CM | POA: Diagnosis not present

## 2023-07-23 DIAGNOSIS — C8190 Hodgkin lymphoma, unspecified, unspecified site: Secondary | ICD-10-CM | POA: Diagnosis not present

## 2023-07-23 DIAGNOSIS — E782 Mixed hyperlipidemia: Secondary | ICD-10-CM | POA: Diagnosis not present

## 2023-07-23 DIAGNOSIS — G4730 Sleep apnea, unspecified: Secondary | ICD-10-CM | POA: Diagnosis not present

## 2023-07-23 DIAGNOSIS — Z125 Encounter for screening for malignant neoplasm of prostate: Secondary | ICD-10-CM | POA: Diagnosis not present

## 2023-07-24 DIAGNOSIS — K080 Exfoliation of teeth due to systemic causes: Secondary | ICD-10-CM | POA: Diagnosis not present

## 2023-07-30 DIAGNOSIS — E782 Mixed hyperlipidemia: Secondary | ICD-10-CM | POA: Diagnosis not present

## 2023-07-30 DIAGNOSIS — Z0000 Encounter for general adult medical examination without abnormal findings: Secondary | ICD-10-CM | POA: Diagnosis not present

## 2023-07-30 DIAGNOSIS — I10 Essential (primary) hypertension: Secondary | ICD-10-CM | POA: Diagnosis not present

## 2023-07-30 DIAGNOSIS — C8190 Hodgkin lymphoma, unspecified, unspecified site: Secondary | ICD-10-CM | POA: Diagnosis not present

## 2023-07-30 DIAGNOSIS — R7303 Prediabetes: Secondary | ICD-10-CM | POA: Diagnosis not present

## 2023-09-11 DIAGNOSIS — J209 Acute bronchitis, unspecified: Secondary | ICD-10-CM | POA: Diagnosis not present

## 2023-10-20 DIAGNOSIS — G4733 Obstructive sleep apnea (adult) (pediatric): Secondary | ICD-10-CM | POA: Diagnosis not present

## 2023-12-19 ENCOUNTER — Telehealth: Payer: Self-pay | Admitting: *Deleted

## 2023-12-19 ENCOUNTER — Other Ambulatory Visit: Payer: Self-pay | Admitting: Hematology and Oncology

## 2023-12-19 DIAGNOSIS — C8170 Other classical Hodgkin lymphoma, unspecified site: Secondary | ICD-10-CM

## 2023-12-19 NOTE — Telephone Encounter (Signed)
 Received vm messages from pt regarding his CT scan done on 12/15/23. Attempted call back. No answer and pt's vm is full-unable to leave message for pt.

## 2023-12-19 NOTE — Telephone Encounter (Signed)
 Received call back from pt. He states he needs to have his yearly CT scan done before he sees Dr. Federico. Advised that I would let him know to put the order in for the scan and that we will move his appt on 12/21/23  to after his scan is done. Provided # to radiology scheduling and advised he call that # tomorrow to get his scan scheduled and then call back to let me know when that appt is so I can scheduled him with Dr. Federico. Pt voiced understanding

## 2023-12-20 ENCOUNTER — Telehealth: Payer: Self-pay | Admitting: Hematology and Oncology

## 2023-12-20 NOTE — Telephone Encounter (Signed)
 Scheduled appointments per scheduling message. Patients voice mail was full, patient will be mailed an appointment reminder.

## 2023-12-21 ENCOUNTER — Ambulatory Visit: Payer: BLUE CROSS/BLUE SHIELD | Admitting: Hematology and Oncology

## 2023-12-21 ENCOUNTER — Other Ambulatory Visit: Payer: BLUE CROSS/BLUE SHIELD

## 2023-12-25 ENCOUNTER — Ambulatory Visit (HOSPITAL_COMMUNITY)
Admission: RE | Admit: 2023-12-25 | Discharge: 2023-12-25 | Disposition: A | Discharge status: Home / Self Care | Payer: Medicare Other | Source: Ambulatory Visit | Attending: Hematology and Oncology | Admitting: Hematology and Oncology

## 2023-12-25 DIAGNOSIS — C8170 Other classical Hodgkin lymphoma, unspecified site: Secondary | ICD-10-CM | POA: Insufficient documentation

## 2023-12-25 DIAGNOSIS — N281 Cyst of kidney, acquired: Secondary | ICD-10-CM | POA: Diagnosis not present

## 2023-12-25 DIAGNOSIS — J439 Emphysema, unspecified: Secondary | ICD-10-CM | POA: Diagnosis not present

## 2023-12-25 DIAGNOSIS — K5750 Diverticulosis of both small and large intestine without perforation or abscess without bleeding: Secondary | ICD-10-CM | POA: Diagnosis not present

## 2023-12-25 DIAGNOSIS — C8190 Hodgkin lymphoma, unspecified, unspecified site: Secondary | ICD-10-CM | POA: Diagnosis not present

## 2023-12-25 MED ORDER — IOHEXOL 300 MG/ML  SOLN
30.0000 mL | Freq: Once | INTRAMUSCULAR | Status: AC | PRN
  Administered 2023-12-25: 30 mL via ORAL

## 2023-12-25 MED ORDER — IOHEXOL 300 MG/ML  SOLN
100.0000 mL | Freq: Once | INTRAMUSCULAR | Status: AC | PRN
  Administered 2023-12-25: 100 mL via INTRAVENOUS

## 2023-12-26 ENCOUNTER — Telehealth: Payer: Self-pay | Admitting: *Deleted

## 2023-12-26 NOTE — Telephone Encounter (Signed)
-----   Message from Rogerio Clay IV sent at 12/26/2023  7:46 AM EST ----- Please let Mr. Holdaway know that his CT scan did not show any evidence of recurrent lymphoma. We will see him back as scheduled next week. ----- Message ----- From: Interface, Rad Results In Sent: 12/25/2023  10:53 PM EST To: Ander Bame, MD

## 2023-12-26 NOTE — Telephone Encounter (Signed)
 TCT patient regarding recent scan results. Spoke to him. Advised that his CT scan did not show any evidence of recurrent lymphoma. We will see him back as scheduled next week. Pt appreciated the call. He is aware of his visit next week.

## 2024-01-02 ENCOUNTER — Inpatient Hospital Stay: Payer: Medicare Other

## 2024-01-02 ENCOUNTER — Other Ambulatory Visit: Payer: Self-pay | Admitting: Hematology and Oncology

## 2024-01-02 ENCOUNTER — Inpatient Hospital Stay: Payer: Medicare Other | Attending: Hematology and Oncology | Admitting: Hematology and Oncology

## 2024-01-02 VITALS — BP 147/89 | HR 70 | Temp 98.7°F | Resp 14 | Wt 232.7 lb

## 2024-01-02 DIAGNOSIS — C8170 Other classical Hodgkin lymphoma, unspecified site: Secondary | ICD-10-CM

## 2024-01-02 DIAGNOSIS — Z923 Personal history of irradiation: Secondary | ICD-10-CM | POA: Insufficient documentation

## 2024-01-02 DIAGNOSIS — Z79899 Other long term (current) drug therapy: Secondary | ICD-10-CM | POA: Diagnosis not present

## 2024-01-02 DIAGNOSIS — E041 Nontoxic single thyroid nodule: Secondary | ICD-10-CM | POA: Insufficient documentation

## 2024-01-02 DIAGNOSIS — Z8571 Personal history of Hodgkin lymphoma: Secondary | ICD-10-CM | POA: Diagnosis not present

## 2024-01-02 LAB — CMP (CANCER CENTER ONLY)
ALT: 13 U/L (ref 0–44)
AST: 14 U/L — ABNORMAL LOW (ref 15–41)
Albumin: 4.1 g/dL (ref 3.5–5.0)
Alkaline Phosphatase: 121 U/L (ref 38–126)
Anion gap: 3 — ABNORMAL LOW (ref 5–15)
BUN: 17 mg/dL (ref 8–23)
CO2: 29 mmol/L (ref 22–32)
Calcium: 9.8 mg/dL (ref 8.9–10.3)
Chloride: 108 mmol/L (ref 98–111)
Creatinine: 0.89 mg/dL (ref 0.61–1.24)
GFR, Estimated: >60 mL/min (ref >60)
Glucose, Bld: 103 mg/dL — ABNORMAL HIGH (ref 70–99)
Potassium: 4.4 mmol/L (ref 3.5–5.1)
Sodium: 140 mmol/L (ref 135–145)
Total Bilirubin: 0.3 mg/dL (ref 0.0–1.2)
Total Protein: 6.9 g/dL (ref 6.5–8.1)

## 2024-01-02 LAB — CBC WITH DIFFERENTIAL (CANCER CENTER ONLY)
Abs Immature Granulocytes: 0.04 10*3/uL (ref 0.00–0.07)
Basophils Absolute: 0.1 10*3/uL (ref 0.0–0.1)
Basophils Relative: 1 %
Eosinophils Absolute: 0.2 10*3/uL (ref 0.0–0.5)
Eosinophils Relative: 2 %
HCT: 44.2 % (ref 39.0–52.0)
Hemoglobin: 14.5 g/dL (ref 13.0–17.0)
Immature Granulocytes: 0 %
Lymphocytes Relative: 29 %
Lymphs Abs: 2.9 10*3/uL (ref 0.7–4.0)
MCH: 29.5 pg (ref 26.0–34.0)
MCHC: 32.8 g/dL (ref 30.0–36.0)
MCV: 90 fL (ref 80.0–100.0)
Monocytes Absolute: 1.1 10*3/uL — ABNORMAL HIGH (ref 0.1–1.0)
Monocytes Relative: 11 %
Neutro Abs: 5.7 10*3/uL (ref 1.7–7.7)
Neutrophils Relative %: 57 %
Platelet Count: 315 10*3/uL (ref 150–400)
RBC: 4.91 MIL/uL (ref 4.22–5.81)
RDW: 14 % (ref 11.5–15.5)
WBC Count: 10.1 10*3/uL (ref 4.0–10.5)
nRBC: 0 % (ref 0.0–0.2)

## 2024-01-02 LAB — LACTATE DEHYDROGENASE: LDH: 123 U/L (ref 98–192)

## 2024-01-02 NOTE — Progress Notes (Signed)
[facility] [phone]   [phone]  PROGRESS NOTE  Patient Care Team: Pearson Grippe, MD as PCP - General (Internal Medicine)  Hematological/Oncological History # Classical Hodgkin's Lymphoma, Stage II in Remission 1)10/10/2019: CT scan for right shoulder pain showed increased right axillary lymphadenopathy.  2) 12/10/2019: patient underwent US of the right axilla, noted to have right axillary lymphadenopathy 3) 12/15/2019: [place] axillary lymph node biopsy. Pathology confirms Classical Hodgkin's lymphoma 4) 12/24/2019: Establish care with Dr. Leonides Schanz 5)  12/31/2019: PET CT scan showed signs of right axillary adenopathy with increased metabolic activity 6) 01/16/2020: Cycle 1 Day 1 of AVD chemotherapy (Bleomycin to be held for the duration of treatment) 7) 01/29/2020: Cycle 1 Day 14 of AVD chemotherapy  8) 02/12/2020: Cycle 2 Day 1 of AVD chemotherapy  9) 02/26/2020: Cycle 2 Day 15 of AVD chemotherapy  10) 03/11/2020: Cycle 3 Day 1 of AVD chemotherapy  11) 03/25/2020: Cycle 3 Day 15 of AVD chemotherapy  12) 04/08/2020: Cycle 4 Day 1 of AVD chemotherapy  13) 04/22/2020: Cycle 4 Day 15 of AVD chemotherapy. Completion of Chemotherapy regimen 14) 06/16/2020: start of radiation therapy.  15) 07/08/2020: final radiation treatment.  16) 10/19/2020: Post treatment CT shows no evidence of residual or recurrent disease. 17) 06/14/2021: Post treatment CT shows no evidence of residual or recurrent disease. 18) 12/16/2021: CT scan shows no evidence of recurrent disease.  19) 06/12/2022: CT scan shows no evidence of recurrent disease.  20) 12/14/2022: CT scan shows no evidence of recurrent disease.   Interval History:  Marc Watson 68 y.o. male with medical history significant for classical Hodgkin Lymphoma who presents for a follow up visit. The patient's last visit was on 12/18/2022. Today the patient presents for his routine surveillance f/u.   On exam today Marc Watson notes he has had  no major changes in his health in the interim since her last visit.  He reports he is having some intentional weight loss and is down to 232 pounds.  He would like to be 220-225.  He reports that he was in a lot of swelling over the summer but when the weather got cold he began cutting back on exercise.  He reports his energy levels have been good.  He did have COVID over Christmas but recovered well from it.  He only had some congestion.  He reports he had no other infectious symptoms such as runny nose, sore throat, cough.  He reports that he has had no bumps or lumps concerning for recurrence of his lymphoma.  Overall he feels well and has no questions concerns or complaints today.Marland Kitchen  He denies having issues with fevers, chills, sweats, nausea vomiting or diarrhea.  A full 10 point ROS is listed below.  MEDICAL HISTORY:  Past Medical History:  Diagnosis Date   Arthritis    COPD (chronic obstructive pulmonary disease) (HCC)    per CXR on 11/11/2019   GERD (gastroesophageal reflux disease)    occ   hodgkins lymphoma 12/2019   Hypertension    recently started taking on 11/05/2019   ALLERGIES:  is allergic to no known allergies.  MEDICATIONS:  Current Outpatient Medications  Medication Sig Dispense Refill   cyclobenzaprine (FLEXERIL) 10 MG tablet Take 10 mg by mouth at bedtime as needed for muscle spasms.      diphenhydrAMINE (BENADRYL) 25 mg capsule Take 25 mg by mouth every 6 (six) hours as needed.     losartan (COZAAR) 50 MG tablet Take 50  mg by mouth daily.     naproxen sodium (ALEVE) 220 MG tablet Take 220 mg by mouth daily as needed.     nicotine (NICODERM CQ - DOSED IN MG/24 HOURS) 14 mg/24hr patch Apply 14mg  patch daily x 6 wk, then 7 mg patch daily x 2 wk 14 patch 2   nicotine (NICODERM CQ - DOSED IN MG/24 HR) 7 mg/24hr patch Apply 14mg  patch daily x 6 wk, then 7 mg patch daily x 2 wk 14 patch 0   Omega-3 Fatty Acids (FISH OIL PO) Take 2 capsules by mouth daily. OMEGA XL PROPRIETARY  BLEND 300 MG d-ALPHA TOCOPHEROL (VITAMIN E)/EXTRA VIRGIN OLIVE OIL/EXTRACT (pcso-524) CONTAINING OMEGA FATTY ACIDS, GREEN LIPPED MUSSEL (PERNA CANLICULUS) OIL)      No current facility-administered medications for this visit.   Facility-Administered Medications Ordered in Other Visits  Medication Dose Route Frequency Provider Last Rate Last Admin   0.9 %  sodium chloride infusion   Intravenous Continuous Allred, Darrell K, PA-C        REVIEW OF SYSTEMS:   Constitutional: ( - ) fevers, ( - )  chills , ( - ) night sweats (+) itching Eyes: ( - ) blurriness of vision, ( - ) double vision, ( - ) watery eyes Ears, nose, mouth, throat, and face: ( - ) mucositis, ( - ) sore throat Respiratory: ( - ) cough, ( - ) dyspnea, ( - ) wheezes Cardiovascular: ( - ) palpitation, ( - ) chest discomfort, ( - ) lower extremity swelling Gastrointestinal:  ( - ) nausea, ( - ) heartburn, ( - ) change in bowel habits Skin: ( - ) abnormal skin rashes Lymphatics: ( - ) new lymphadenopathy, ( - ) easy bruising Neurological: ( - ) numbness, ( - ) tingling, ( - ) new weaknesses Behavioral/Psych: ( - ) mood change, ( - ) new changes  All other systems were reviewed with the patient and are negative.  PHYSICAL EXAMINATION: ECOG PERFORMANCE STATUS: 0 - Asymptomatic  Vitals:   01/02/24 1100  BP: (!) 147/89  Pulse: 70  Resp: 14  Temp: 98.7 F (37.1 C)  SpO2: 98%     Filed Weights   01/02/24 1100  Weight: 232 lb 11.2 oz (105.6 kg)      GENERAL: well appearing middle aged Caucasian male in NAD  SKIN: skin color, texture, turgor are normal, no rashes or significant lesions.  EYES: conjunctiva are pink and non-injected, sclera clear LUNGS: clear to auscultation and percussion with normal breathing effort HEART: regular rate & rhythm and no murmurs and no lower extremity edema Musculoskeletal: no cyanosis of digits and no clubbing  PSYCH: alert & oriented x 3, fluent speech NEURO: no focal motor/sensory  deficits  LABORATORY DATA:  I have reviewed the data as listed    Latest Ref Rng & Units 01/02/2024   10:14 AM 12/18/2022    9:43 AM 06/15/2022    1:42 PM  CBC  WBC 4.0 - 10.5 K/uL 10.1  8.3  10.0   Hemoglobin 13.0 - 17.0 g/dL 19.1  47.8  29.5   Hematocrit 39.0 - 52.0 % 44.2  44.9  40.5   Platelets 150 - 400 K/uL 315  326  262        Latest Ref Rng & Units 01/02/2024   10:14 AM 12/18/2022    9:43 AM 06/15/2022    1:42 PM  CMP  Glucose 70 - 99 mg/dL 621  97  308   BUN 8 -  23 mg/dL 17  15  22    Creatinine 0.61 - 1.24 mg/dL 1.61  0.96  0.45   Sodium 135 - 145 mmol/L 140  140  143   Potassium 3.5 - 5.1 mmol/L 4.4  3.7  3.7   Chloride 98 - 111 mmol/L 108  108  110   CO2 22 - 32 mmol/L 29  26  26    Calcium 8.9 - 10.3 mg/dL 9.8  9.9  9.7   Total Protein 6.5 - 8.1 g/dL 6.9  6.7  6.7   Total Bilirubin 0.0 - 1.2 mg/dL 0.3  0.4  0.4   Alkaline Phos 38 - 126 U/L 121  104  111   AST 15 - 41 U/L 14  15  14    ALT 0 - 44 U/L 13  12  13       RADIOGRAPHIC STUDIES: I have personally reviewed the radiological images as listed and agreed with the findings in the report: no evidence of recurrent disease.  CT CHEST ABDOMEN PELVIS W CONTRAST Result Date: 12/25/2023 CLINICAL DATA:  Hodgkin's lymphoma restaging * Tracking Code: BO * EXAM: CT CHEST, ABDOMEN, AND PELVIS WITH CONTRAST TECHNIQUE: Multidetector CT imaging of the chest, abdomen and pelvis was performed following the standard protocol during bolus administration of intravenous contrast. RADIATION DOSE REDUCTION: This exam was performed according to the departmental dose-optimization program which includes automated exposure control, adjustment of the mA and/or kV according to patient size and/or use of iterative reconstruction technique. CONTRAST:  30mL OMNIPAQUE IOHEXOL 300 MG/ML SOLN, OMNIPAQUE IOHEXOL 300 MG/ML SOLN additional oral enteric contrast COMPARISON:  12/14/2022 FINDINGS: CT CHEST FINDINGS Cardiovascular: Scattered aortic  atherosclerosis. Normal heart size. Left and right coronary artery calcifications. No pericardial effusion. Mediastinum/Nodes: Unchanged prominent mediastinal lymph nodes, again measuring up to 1.7 x 1.3 cm in the AP window (series 2, image 20). Trachea and esophagus demonstrate no significant findings. Lungs/Pleura: Bibasilar scarring or atelectasis. Mild centrilobular and paraseptal emphysema. Diffuse bilateral bronchial wall thickening. No pleural effusion or pneumothorax. Musculoskeletal: No chest wall abnormality. No acute osseous findings. CT ABDOMEN PELVIS FINDINGS Hepatobiliary: No solid liver abnormality is seen. Contracted gallbladder. No gallstones, gallbladder wall thickening, or biliary dilatation. Pancreas: Unremarkable. No pancreatic ductal dilatation or surrounding inflammatory changes. Spleen: Normal in size without significant abnormality. Adrenals/Urinary Tract: Unchanged benign left adrenal adenomata, requiring no further follow-up or characterization. Normal right adrenal. Simple, benign left renal cortical cysts, for which no further follow-up or characterization is required. Kidneys are otherwise normal, without renal calculi, solid lesion, or hydronephrosis. Bladder is unremarkable. Stomach/Bowel: Stomach is within normal limits. Descending duodenal diverticulum. Appendix appears normal. No evidence of bowel wall thickening, distention, or inflammatory changes. Descending colonic diverticulosis. Vascular/Lymphatic: Aortic atherosclerosis. No enlarged abdominal or pelvic lymph nodes. Reproductive: No mass or other abnormality. Other: No abdominal wall hernia or abnormality. No ascites. Musculoskeletal: No acute osseous findings. Status post left hip total arthroplasty. IMPRESSION: 1. Unchanged prominent mediastinal lymph nodes. No enlarged lymph nodes in the abdomen or pelvis. 2. Normal spleen size. 3. Emphysema and diffuse bilateral bronchial wall thickening. 4. Coronary artery disease.  Aortic Atherosclerosis (ICD10-I70.0) and Emphysema (ICD10-J43.9). Electronically Signed   By: Jearld Lesch M.D.   On: 12/25/2023 22:51    ASSESSMENT & PLAN Marc Watson 68 y.o. male with medical history significant for classical Hodgkin Lymphoma in remission who presents for a follow up visit.  Overall Mr. Lahner has been well in the interim since our last visit.  His energy has  been good and his weight has been stable. His most recent CT scan showed no evidence of residual recurrent disease.  The plan was for AVD chemotherapy x 4 total cycles followed by ISRT. After all treatments would reassess with PET CT scan (recommended within 3 months of completion of therapy). The chemotherapy regimen consisted of doxorubicin 25mg /m2, vinblastine 6mg /m2, and dacarbazine 375mg /m2 on Day 1 and Day 15. Bleomycin will be held on concern for his lung function and for his age. He started Cycle 1 Day 1 on 01/16/2020.  Follow-up PET CT scan on 03/08/2020 showed complete response to therapy.  As such he entered surveillance and is receiving CT scans every 6 months for the first 2 years and then yearly after that.  IPS Score For Hodgkin Lymphoma: 2 (81% OS, 67% freedom from progression)  # Classical Hodgkin's Lymphoma, Stage II (Nonbulky, Favorable) in Remission --completed AVD chemotherapy (with no bleomycin) and radiation therapy.   --restaging post Cycle 2 PET scan performed 03/08/2020, showed significant improvement, with complete resolution of the right axillary adenopathy (the larger previous dominant lymph nodes are currently no longer present compatible with Deauville 1 disease); reduced activity and size of lymph nodes in the neck, currently Deauville 2; and Deauville 2 activity in the AP window lymph node.Most recent CT performed approximately 12 weeks after completion of radiation therapy (July 2022), no evidence of residual disease or lymph adenopathy.  --patient has undergone appropriate pre-treatment  testing including TTE, port placement, and blood work. PFTs were not able to be performed due to issues with scheduling during the COVID pandemic. Bleomycin held due to age and concern for underlying lung disease --appreciate assessment by radiation oncology for post chemotherapy RT to the affected lymph nodes. Therapy now completed.  Plan:  --Per NCCN guidelines, will have clinic f/u q 3-6 months for 1-2 years followed by every 6-12 months until year 3, then annually. CT scan no more often than q 6 months for the first 2 years following therapy. CT scans only as clinically indicated moving forward.  --Labs today show white blood cell count 10.1, Hgb 14.5, MCV 90, Plt 315 --plan to RTC in 12 months per patient request.   #Thyroid Nodule, stable --found incidentally on last PET CT scan on 12/31/2019 --US performed, appears stable based on prior imaging.  -- Biopsy showed a Bethesda category 1 lesion (non-diagnostic) --continue to monitor, can address repeat biopsy if persistent.   Orders Placed This Encounter  Procedures   CT CHEST ABDOMEN PELVIS W CONTRAST    Standing Status:   Future    Expected Date:   12/31/2024    Expiration Date:   01/01/2025    If indicated for the ordered procedure, I authorize the administration of contrast media per Radiology protocol:   Yes    Does the patient have a contrast media/X-ray dye allergy?:   No    Preferred imaging location?:   [facility]    If indicated for the ordered procedure, I authorize the administration of oral contrast media per Radiology protocol:   Yes   All questions were answered. The patient knows to call the clinic with any problems, questions or concerns.  A total of more than 25 minutes were spent on this encounter and over half of that time was spent on counseling and coordination of care as outlined above.   Ulysees Barns, MD Department of Hematology/Oncology [facility] Phone:  [phone] Pager: [phone] Email: [email or web address]  01/02/2024 1:50 PM

## 2024-01-21 DIAGNOSIS — G4733 Obstructive sleep apnea (adult) (pediatric): Secondary | ICD-10-CM | POA: Diagnosis not present

## 2024-01-24 DIAGNOSIS — K080 Exfoliation of teeth due to systemic causes: Secondary | ICD-10-CM | POA: Diagnosis not present

## 2024-04-19 DIAGNOSIS — G4733 Obstructive sleep apnea (adult) (pediatric): Secondary | ICD-10-CM | POA: Diagnosis not present

## 2024-04-30 DIAGNOSIS — H524 Presbyopia: Secondary | ICD-10-CM | POA: Diagnosis not present

## 2024-07-20 DIAGNOSIS — G4733 Obstructive sleep apnea (adult) (pediatric): Secondary | ICD-10-CM | POA: Diagnosis not present

## 2024-07-24 DIAGNOSIS — L219 Seborrheic dermatitis, unspecified: Secondary | ICD-10-CM | POA: Diagnosis not present

## 2024-07-24 DIAGNOSIS — L814 Other melanin hyperpigmentation: Secondary | ICD-10-CM | POA: Diagnosis not present

## 2024-07-24 DIAGNOSIS — D225 Melanocytic nevi of trunk: Secondary | ICD-10-CM | POA: Diagnosis not present

## 2024-07-24 DIAGNOSIS — D485 Neoplasm of uncertain behavior of skin: Secondary | ICD-10-CM | POA: Diagnosis not present

## 2024-07-24 DIAGNOSIS — D2239 Melanocytic nevi of other parts of face: Secondary | ICD-10-CM | POA: Diagnosis not present

## 2024-07-28 DIAGNOSIS — R7303 Prediabetes: Secondary | ICD-10-CM | POA: Diagnosis not present

## 2024-07-28 DIAGNOSIS — C8190 Hodgkin lymphoma, unspecified, unspecified site: Secondary | ICD-10-CM | POA: Diagnosis not present

## 2024-07-28 DIAGNOSIS — Z125 Encounter for screening for malignant neoplasm of prostate: Secondary | ICD-10-CM | POA: Diagnosis not present

## 2024-07-28 DIAGNOSIS — K080 Exfoliation of teeth due to systemic causes: Secondary | ICD-10-CM | POA: Diagnosis not present

## 2024-07-28 DIAGNOSIS — E782 Mixed hyperlipidemia: Secondary | ICD-10-CM | POA: Diagnosis not present

## 2024-08-05 DIAGNOSIS — C8190 Hodgkin lymphoma, unspecified, unspecified site: Secondary | ICD-10-CM | POA: Diagnosis not present

## 2024-08-05 DIAGNOSIS — R7303 Prediabetes: Secondary | ICD-10-CM | POA: Diagnosis not present

## 2024-08-05 DIAGNOSIS — F17200 Nicotine dependence, unspecified, uncomplicated: Secondary | ICD-10-CM | POA: Diagnosis not present

## 2024-08-05 DIAGNOSIS — E7800 Pure hypercholesterolemia, unspecified: Secondary | ICD-10-CM | POA: Diagnosis not present

## 2024-08-05 DIAGNOSIS — Z0000 Encounter for general adult medical examination without abnormal findings: Secondary | ICD-10-CM | POA: Diagnosis not present

## 2024-08-06 ENCOUNTER — Other Ambulatory Visit (HOSPITAL_BASED_OUTPATIENT_CLINIC_OR_DEPARTMENT_OTHER): Payer: Self-pay

## 2024-08-06 DIAGNOSIS — E782 Mixed hyperlipidemia: Secondary | ICD-10-CM

## 2024-08-07 DIAGNOSIS — K080 Exfoliation of teeth due to systemic causes: Secondary | ICD-10-CM | POA: Diagnosis not present

## 2024-08-20 DIAGNOSIS — C44319 Basal cell carcinoma of skin of other parts of face: Secondary | ICD-10-CM | POA: Diagnosis not present

## 2024-08-27 ENCOUNTER — Ambulatory Visit (HOSPITAL_BASED_OUTPATIENT_CLINIC_OR_DEPARTMENT_OTHER)
Admission: RE | Admit: 2024-08-27 | Discharge: 2024-08-27 | Disposition: A | Discharge status: Home / Self Care | Payer: Self-pay | Source: Ambulatory Visit

## 2024-08-27 DIAGNOSIS — E782 Mixed hyperlipidemia: Secondary | ICD-10-CM | POA: Insufficient documentation

## 2024-10-20 DIAGNOSIS — G4733 Obstructive sleep apnea (adult) (pediatric): Secondary | ICD-10-CM | POA: Diagnosis not present

## 2024-12-31 ENCOUNTER — Encounter (HOSPITAL_COMMUNITY): Payer: Self-pay

## 2024-12-31 ENCOUNTER — Ambulatory Visit (HOSPITAL_COMMUNITY)
Admission: RE | Admit: 2024-12-31 | Discharge: 2024-12-31 | Disposition: A | Discharge status: Home / Self Care | Source: Ambulatory Visit | Attending: Hematology and Oncology | Admitting: Hematology and Oncology

## 2024-12-31 DIAGNOSIS — C8170 Other classical Hodgkin lymphoma, unspecified site: Secondary | ICD-10-CM | POA: Diagnosis present

## 2024-12-31 MED ORDER — IOHEXOL 9 MG/ML PO SOLN
1000.0000 mL | ORAL | Status: AC
  Administered 2024-12-31: 1000 mL via ORAL

## 2024-12-31 MED ORDER — IOHEXOL 300 MG/ML  SOLN
100.0000 mL | Freq: Once | INTRAMUSCULAR | Status: AC | PRN
  Administered 2024-12-31: 100 mL via INTRAVENOUS

## 2025-01-06 ENCOUNTER — Other Ambulatory Visit: Payer: Self-pay | Admitting: Hematology and Oncology

## 2025-01-06 DIAGNOSIS — C8170 Other classical Hodgkin lymphoma, unspecified site: Secondary | ICD-10-CM
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
## 2025-01-07 ENCOUNTER — Inpatient Hospital Stay: Payer: Medicare Other | Admitting: Hematology and Oncology

## 2025-01-07 ENCOUNTER — Inpatient Hospital Stay: Payer: Medicare Other | Attending: Hematology and Oncology

## 2025-01-07 VITALS — BP 143/85 | HR 88 | Temp 98.0°F | Resp 18 | Ht 73.0 in | Wt 254.5 lb

## 2025-01-07 DIAGNOSIS — C8170 Other classical Hodgkin lymphoma, unspecified site: Secondary | ICD-10-CM

## 2025-01-07 DIAGNOSIS — Z923 Personal history of irradiation: Secondary | ICD-10-CM | POA: Insufficient documentation

## 2025-01-07 DIAGNOSIS — Z8571 Personal history of Hodgkin lymphoma: Secondary | ICD-10-CM | POA: Insufficient documentation

## 2025-01-07 DIAGNOSIS — Z79899 Other long term (current) drug therapy: Secondary | ICD-10-CM | POA: Insufficient documentation

## 2025-01-07 DIAGNOSIS — E041 Nontoxic single thyroid nodule: Secondary | ICD-10-CM | POA: Diagnosis not present

## 2025-01-07 LAB — CBC WITH DIFFERENTIAL (CANCER CENTER ONLY)
Abs Immature Granulocytes: 0.03 10*3/uL (ref 0.00–0.07)
Basophils Absolute: 0.1 10*3/uL (ref 0.0–0.1)
Basophils Relative: 1 %
Eosinophils Absolute: 0.2 10*3/uL (ref 0.0–0.5)
Eosinophils Relative: 2 %
HCT: 45.1 % (ref 39.0–52.0)
Hemoglobin: 15.3 g/dL (ref 13.0–17.0)
Immature Granulocytes: 0 %
Lymphocytes Relative: 32 %
Lymphs Abs: 3.1 10*3/uL (ref 0.7–4.0)
MCH: 29.4 pg (ref 26.0–34.0)
MCHC: 33.9 g/dL (ref 30.0–36.0)
MCV: 86.7 fL (ref 80.0–100.0)
Monocytes Absolute: 0.9 10*3/uL (ref 0.1–1.0)
Monocytes Relative: 10 %
Neutro Abs: 5.5 10*3/uL (ref 1.7–7.7)
Neutrophils Relative %: 55 %
Platelet Count: 280 10*3/uL (ref 150–400)
RBC: 5.2 MIL/uL (ref 4.22–5.81)
RDW: 13.3 % (ref 11.5–15.5)
WBC Count: 9.9 10*3/uL (ref 4.0–10.5)
nRBC: 0 % (ref 0.0–0.2)

## 2025-01-07 LAB — CMP (CANCER CENTER ONLY)
ALT: 21 U/L (ref 0–44)
AST: 22 U/L (ref 15–41)
Albumin: 4.2 g/dL (ref 3.5–5.0)
Alkaline Phosphatase: 136 U/L — ABNORMAL HIGH (ref 38–126)
Anion gap: 10 (ref 5–15)
BUN: 17 mg/dL (ref 8–23)
CO2: 26 mmol/L (ref 22–32)
Calcium: 9.6 mg/dL (ref 8.9–10.3)
Chloride: 105 mmol/L (ref 98–111)
Creatinine: 0.93 mg/dL (ref 0.61–1.24)
GFR, Estimated: >60 mL/min
Glucose, Bld: 118 mg/dL — ABNORMAL HIGH (ref 70–99)
Potassium: 4.1 mmol/L (ref 3.5–5.1)
Sodium: 141 mmol/L (ref 135–145)
Total Bilirubin: <0.2 mg/dL (ref 0.0–1.2)
Total Protein: 7.2 g/dL (ref 6.5–8.1)

## 2025-01-07 LAB — LACTATE DEHYDROGENASE: LDH: 204 U/L (ref 105–235)
